# Patient Record
Sex: Female | Born: 1940 | Race: White | Hispanic: No | Marital: Married | Smoking: Never smoker
Health system: Southern US, Community
[De-identification: ages and names within clinical notes are randomized; demographics above are authoritative.]

## PROBLEM LIST (undated history)

## (undated) DIAGNOSIS — M810 Age-related osteoporosis without current pathological fracture: Secondary | ICD-10-CM

## (undated) DIAGNOSIS — M1990 Unspecified osteoarthritis, unspecified site: Secondary | ICD-10-CM

## (undated) DIAGNOSIS — B399 Histoplasmosis, unspecified: Secondary | ICD-10-CM

## (undated) DIAGNOSIS — E7800 Pure hypercholesterolemia, unspecified: Secondary | ICD-10-CM

## (undated) DIAGNOSIS — N200 Calculus of kidney: Secondary | ICD-10-CM

## (undated) DIAGNOSIS — I10 Essential (primary) hypertension: Secondary | ICD-10-CM

## (undated) DIAGNOSIS — M353 Polymyalgia rheumatica: Secondary | ICD-10-CM

## (undated) DIAGNOSIS — G5601 Carpal tunnel syndrome, right upper limb: Secondary | ICD-10-CM

## (undated) HISTORY — DX: Age-related osteoporosis without current pathological fracture: M81.0

## (undated) HISTORY — PX: OTHER SURGICAL HISTORY: SHX169

## (undated) HISTORY — DX: Calculus of kidney: N20.0

## (undated) HISTORY — DX: Carpal tunnel syndrome, right upper limb: G56.01

## (undated) HISTORY — PX: KNEE SURGERY: SHX244

## (undated) HISTORY — PX: JOINT REPLACEMENT: SHX530

## (undated) HISTORY — DX: Pure hypercholesterolemia, unspecified: E78.00

## (undated) HISTORY — PX: TONSILLECTOMY AND ADENOIDECTOMY: SUR1326

## (undated) HISTORY — DX: Polymyalgia rheumatica: M35.3

---

## 1989-12-13 HISTORY — PX: CERVICAL DISCECTOMY: SHX98

## 1991-05-14 DIAGNOSIS — E7800 Pure hypercholesterolemia, unspecified: Secondary | ICD-10-CM

## 1991-05-14 HISTORY — DX: Pure hypercholesterolemia, unspecified: E78.00

## 1995-02-14 DIAGNOSIS — M353 Polymyalgia rheumatica: Secondary | ICD-10-CM

## 1995-02-14 HISTORY — DX: Polymyalgia rheumatica: M35.3

## 1997-07-14 ENCOUNTER — Other Ambulatory Visit: Admission: RE | Admit: 1997-07-14 | Discharge: 1997-07-14 | Payer: Self-pay | Admitting: Obstetrics and Gynecology

## 1998-08-03 ENCOUNTER — Other Ambulatory Visit: Admission: RE | Admit: 1998-08-03 | Discharge: 1998-08-03 | Payer: Self-pay | Admitting: Obstetrics and Gynecology

## 1999-08-20 ENCOUNTER — Other Ambulatory Visit: Admission: RE | Admit: 1999-08-20 | Discharge: 1999-08-20 | Payer: Self-pay | Admitting: Obstetrics and Gynecology

## 2000-09-24 ENCOUNTER — Other Ambulatory Visit: Admission: RE | Admit: 2000-09-24 | Discharge: 2000-09-24 | Payer: Self-pay | Admitting: Obstetrics and Gynecology

## 2001-09-21 ENCOUNTER — Other Ambulatory Visit: Admission: RE | Admit: 2001-09-21 | Discharge: 2001-09-21 | Payer: Self-pay | Admitting: Obstetrics and Gynecology

## 2002-09-26 ENCOUNTER — Other Ambulatory Visit: Admission: RE | Admit: 2002-09-26 | Discharge: 2002-09-26 | Payer: Self-pay | Admitting: Obstetrics and Gynecology

## 2003-01-09 ENCOUNTER — Ambulatory Visit (HOSPITAL_COMMUNITY): Admission: RE | Admit: 2003-01-09 | Discharge: 2003-01-09 | Payer: Self-pay | Admitting: Gastroenterology

## 2003-10-04 ENCOUNTER — Other Ambulatory Visit: Admission: RE | Admit: 2003-10-04 | Discharge: 2003-10-04 | Payer: Self-pay | Admitting: *Deleted

## 2005-11-07 ENCOUNTER — Other Ambulatory Visit: Admission: RE | Admit: 2005-11-07 | Discharge: 2005-11-07 | Payer: Self-pay | Admitting: Obstetrics & Gynecology

## 2006-06-14 HISTORY — PX: CARPAL TUNNEL RELEASE: SHX101

## 2006-06-18 ENCOUNTER — Ambulatory Visit (HOSPITAL_BASED_OUTPATIENT_CLINIC_OR_DEPARTMENT_OTHER): Admission: RE | Admit: 2006-06-18 | Discharge: 2006-06-18 | Payer: Self-pay | Admitting: Orthopedic Surgery

## 2006-10-14 DIAGNOSIS — G5601 Carpal tunnel syndrome, right upper limb: Secondary | ICD-10-CM

## 2006-10-14 HISTORY — DX: Carpal tunnel syndrome, right upper limb: G56.01

## 2007-12-24 ENCOUNTER — Other Ambulatory Visit: Admission: RE | Admit: 2007-12-24 | Discharge: 2007-12-24 | Payer: Self-pay | Admitting: Obstetrics & Gynecology

## 2008-01-14 DIAGNOSIS — N200 Calculus of kidney: Secondary | ICD-10-CM

## 2008-01-14 HISTORY — DX: Calculus of kidney: N20.0

## 2008-04-11 ENCOUNTER — Encounter: Admission: RE | Admit: 2008-04-11 | Discharge: 2008-04-11 | Payer: Self-pay | Admitting: Family Medicine

## 2008-04-11 IMAGING — CT CT PELVIS W/O CM
2 of 4 series · 17 of 46 positions shown, 19 images · non-contrast
Comparison: None

CT ABDOMEN

CLINICAL DATA: Right-sided flank and abdominal pain.

CT ABDOMEN AND PELVIS WITHOUT CONTRAST
TECHNIQUE: Multidetector CT imaging of the abdomen and pelvis was
performed following the standard protocol without intravenous
contrast.

[Series 2: wo · axial · 0.68mm/px · z∈[+1045,+1440]mm · 14 of 87 slices shown, 16 images]
[im 4/87  soft-tissue]
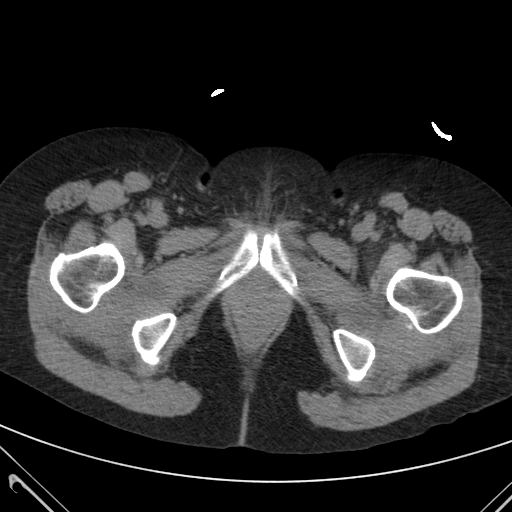
[im 4/87  bone]
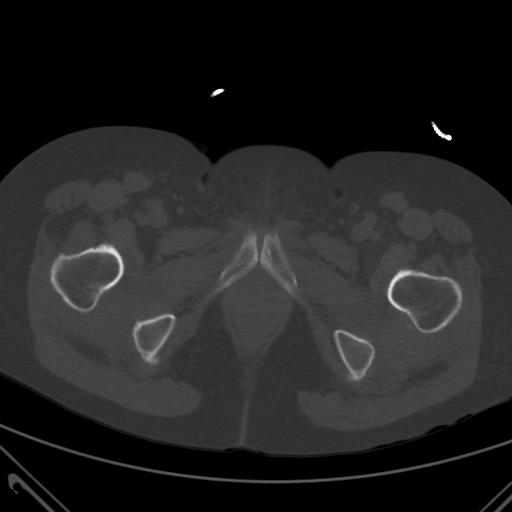
[im 11/87  soft-tissue]
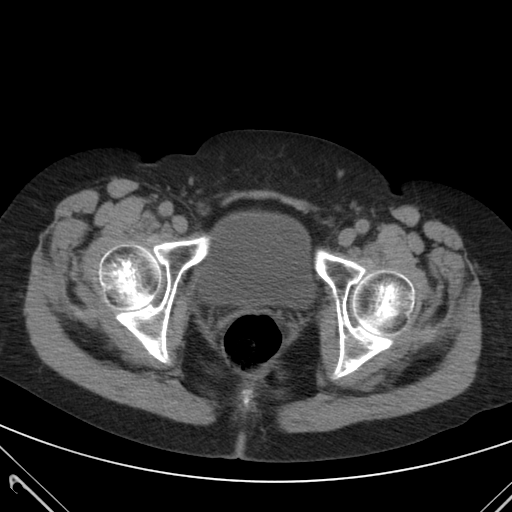
[im 18/87  soft-tissue]
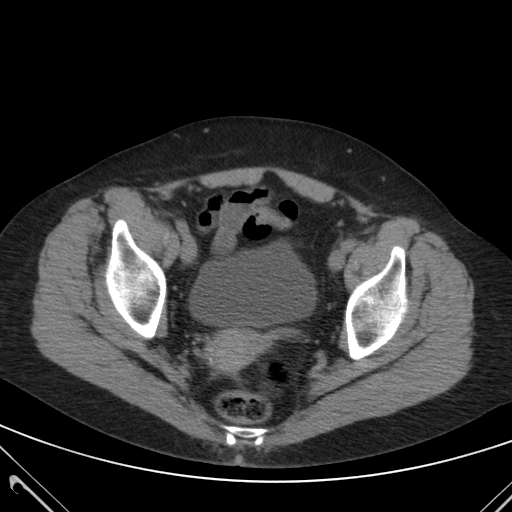
[im 22/87  soft-tissue]
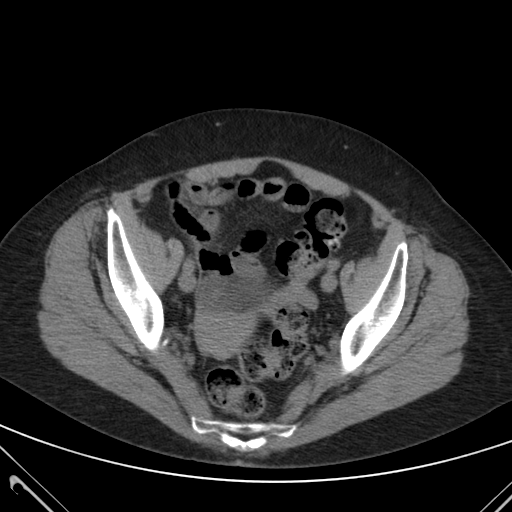
[im 29/87  soft-tissue]
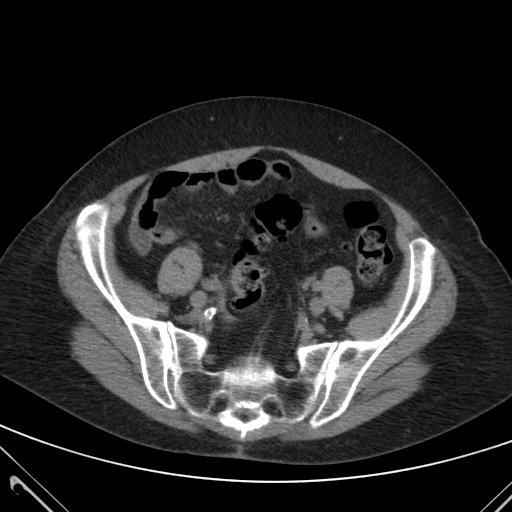
[im 36/87  soft-tissue]
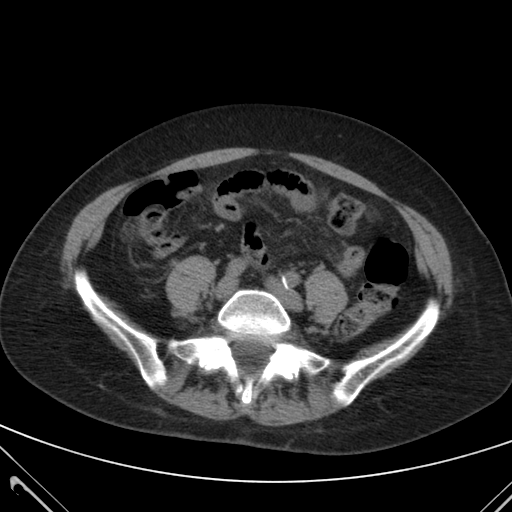
[im 40/87  soft-tissue]
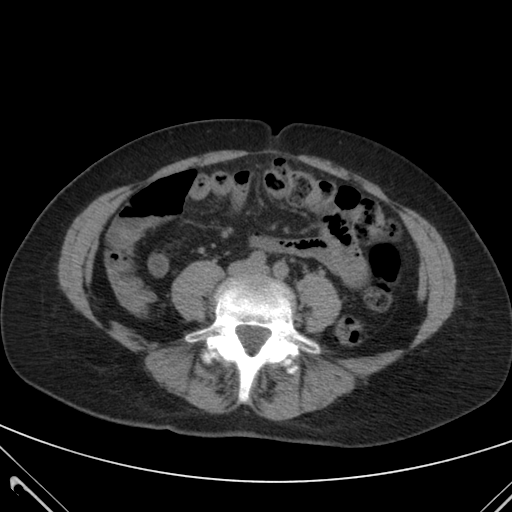
[im 47/87  soft-tissue]
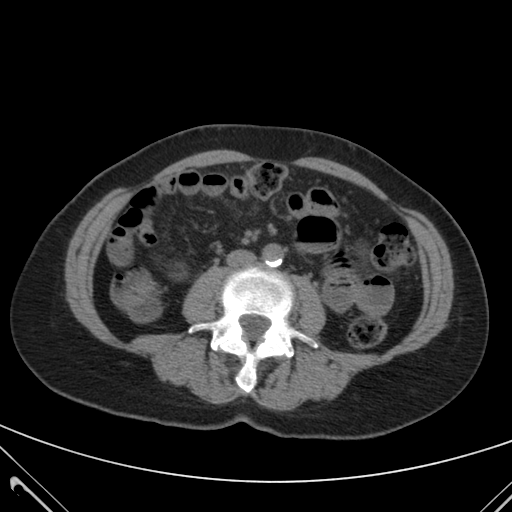
[im 51/87  soft-tissue]
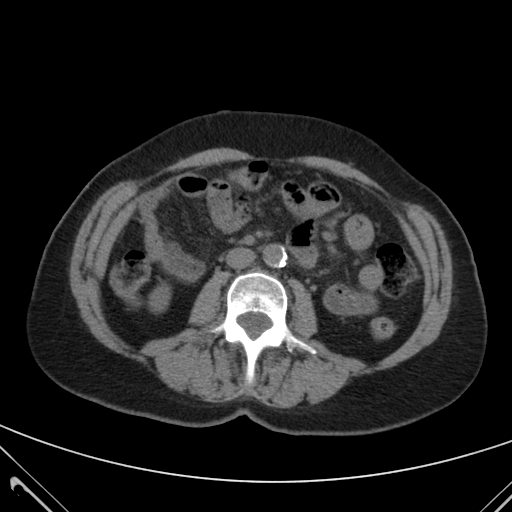
[im 51/87  bone]
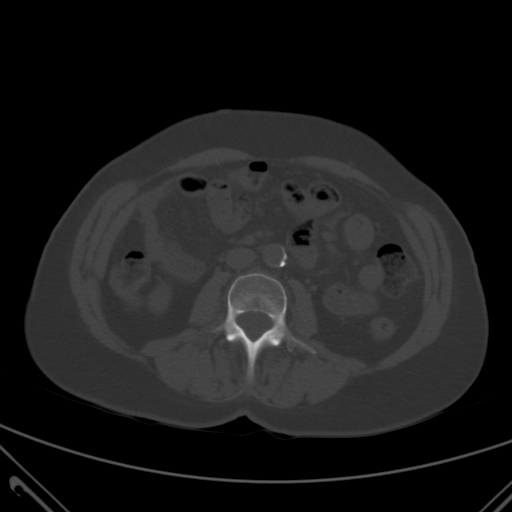
[im 58/87  soft-tissue]
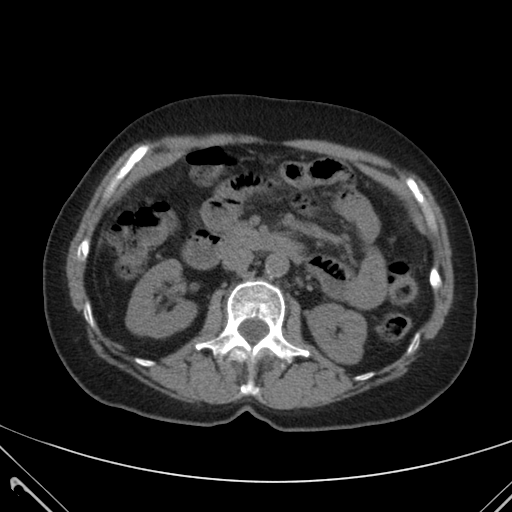
[im 65/87  soft-tissue]
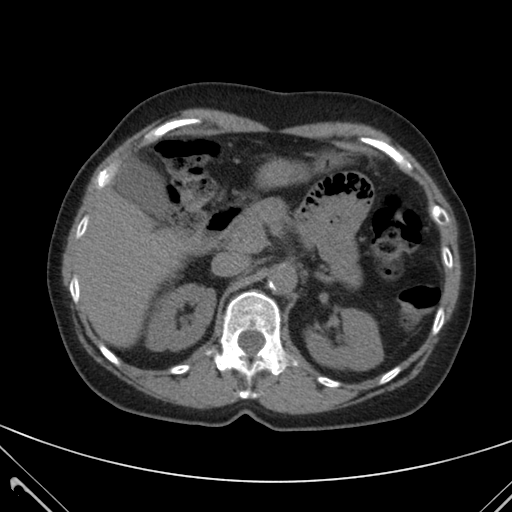
[im 69/87  soft-tissue]
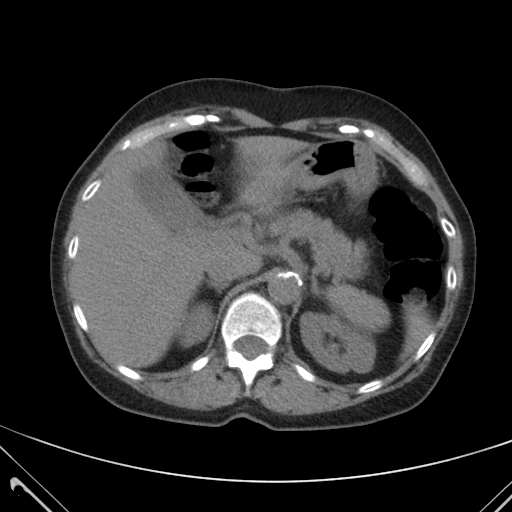
[im 76/87  soft-tissue]
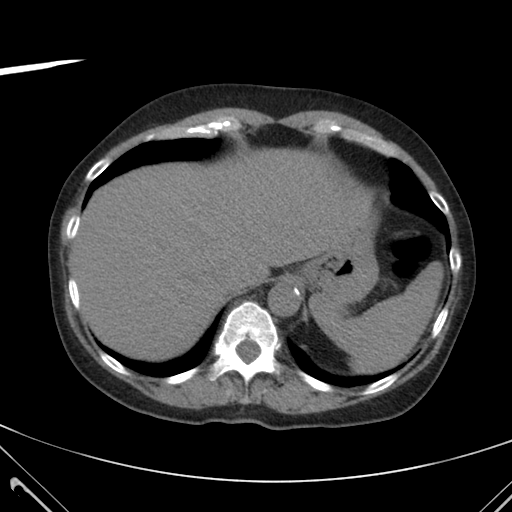
[im 83/87  soft-tissue]
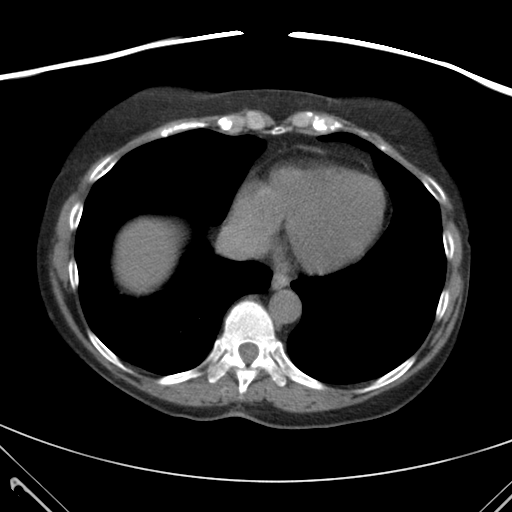

[coronals · coronal · 0.84mm/px · 3 of 115 slices shown]
[im 39/115  soft-tissue]
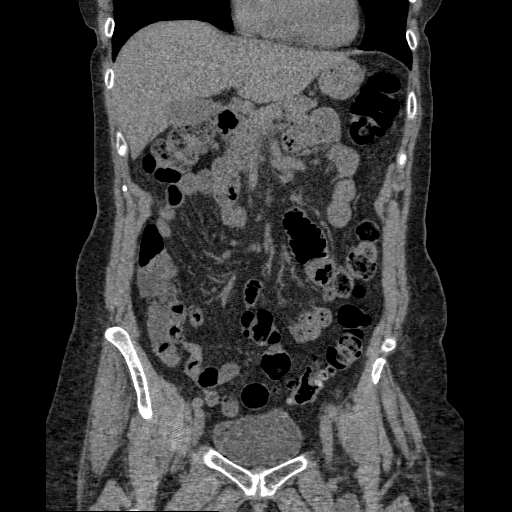
[im 51/115  soft-tissue]
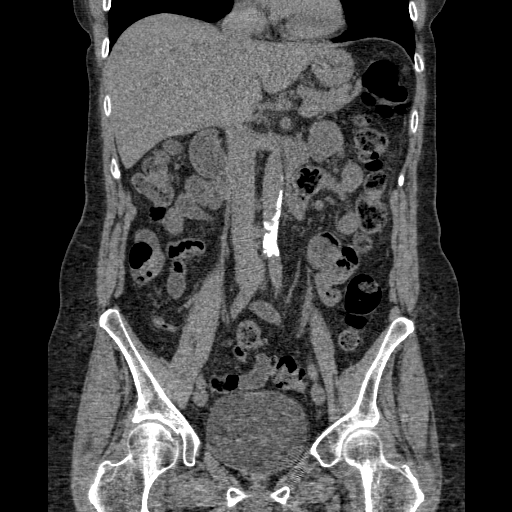
[im 64/115  soft-tissue]
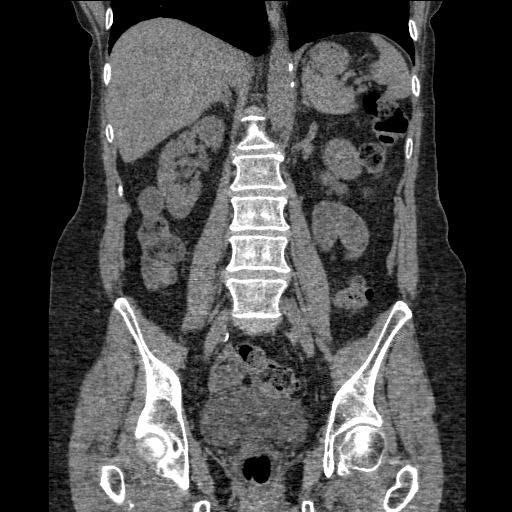

[17 of 46 positions shown; findings below may reference images not displayed]

FINDINGS: A calcified granuloma in the left lower lobe is noted.

The liver, gallbladder, spleen, pancreas and adrenal glands are
unremarkable.
Nonobstructing punctate bilateral renal calculi are identified but
the kidneys are otherwise within normal limits.
Please note that parenchymal abnormalities may be missed as
intravenous contrast was not administered.
No free fluid, enlarged lymph nodes, biliary dilation or abdominal
aortic aneurysm identified.
The visualized bowel is unremarkable.
No acute or suspicious bony abnormalities are identified.
IMPRESSION: No evidence of acute abnormality.

Nonobstructing punctate bilateral renal calculi.

CT PELVIS
FINDINGS: Descending and sigmoid colonic diverticulosis is
identified without evidence of diverticulitis.
The remainder of the bowel and bladder are unremarkable.
The appendix is normal.
There is no evidence of free fluid, enlarged lymph nodes, or
urinary calculi within the pelvis.
No acute or suspicious bony abnormalities are identified.
IMPRESSION: No evidence of acute abnormality.

Colonic diverticulosis without evidence of diverticulitis.

## 2010-05-28 NOTE — Op Note (Signed)
NAME:  Age, Nicha               ACCOUNT NO.:  [id number]   MEDICAL RECORD NO.:  [id number]          PATIENT TYPE:  AMB   LOCATION:  DSC                          FACILITY:  [facility]   PHYSICIAN:  Katy Fitch. Sypher, M.D. DATE OF BIRTH:  14-Jul-1940   DATE OF PROCEDURE:  06/18/2006  DATE OF DISCHARGE:                               OPERATIVE REPORT   PREOPERATIVE DIAGNOSIS:  Bilateral carpal tunnel syndrome.   POSTOPERATIVE DIAGNOSIS:  Bilateral carpal tunnel syndrome.   OPERATION:  Release of right transverse carpal ligament and injection of  left ulnar bursa.   OPERATING SURGEON:  Josephine Igo, M.D.   ASSISTANT:  Molly Maduro Dasnoit PA-C.   ANESTHESIA:  General by LMA, supervising anesthesiologist is Dr. Gypsy Balsam.   INDICATIONS:  Angela [name] is a 33 woman referred through the courtesy  of Dr. Estill Bakes, rheumatologist for evaluation and management of  bilateral hand numbness.  Angela [name] has a history of polymyalgia  rheumatica that Dr. Phylliss Bob has been treating with a tapering steroid dose.   She reported bilateral hand numbness was referred to Dr. Meryl Crutch for  electrodiagnostic studies.  These confirm moderately severe bilateral  carpal tunnel syndrome.   Due to failed respond to nonoperative measures, she is brought to the  operating this time for release of her right transverse carpal ligament  and injection of her left ulnar bursa.   After informed consent she is brought to the operating at this time.   PROCEDURE:  Angela [name] is brought to the operating room and placed  supine position on the operating table.   Following induction general anesthesia by LMA technique the right arm  was prepped Betadine soap solution, sterilely draped.  Following  exsanguination of right arm with Esmarch bandage an arterial tourniquet  on the proximal brachium inflated to 220 meters mercury.  The procedure  commenced with a short incision in line of the ring finger in the palm.  The subcutaneous  tissues were carefully divided revealing the palmar  fascia.  This split longitudinally to reveal the common sensory branch  of the median nerve.  These were followed back to the medium nerve  proper.  The nerve was gently isolated from the transverse carpal  ligament.  Ligament released with scissors along its ulnar border  extending into the distal forearm.  This widely opened carpal canal.  No  mass or other predicaments were noted.   Bleeding points along margin of the released ligament were  electrocauterized with bipolar current followed by repair of the skin  with intradermal 3-0 Prolene and Steri-Strips.   The wound was then dressed with Xeroflo sterile gauze and a volar  plaster splint maintaining the wrist in 5 degrees of dorsiflexion.   Attention directed the left wrist.  After Betadine prep, the ulnar bursa  was injected in line of the ring finger at the proximal wrist flexion  crease with a mixture of 50/50 Depo-Medrol 40 mg per mL and 2% plain  lidocaine for total volume of 2 mL.  Injection was uncomplicated.   Angela [name] was transferred recovery room stable signs.  We will see her  for follow-up in our office in approximately one week.   She will begin splinting of the left wrist to treat her left carpal  tunnel syndrome.      Katy Fitch Sypher, M.D.  Electronically Signed     RVS/MEDQ  D:  06/18/2006  T:  06/18/2006  Job:  [id number]   cc:   Areatha Keas, M.D.

## 2010-05-31 NOTE — Op Note (Signed)
NAME:  Angela [name], Angela [name]                         ACCOUNT NO.:  [id number]   MEDICAL RECORD NO.:  [id number]                   PATIENT TYPE:  AMB   LOCATION:  ENDO                                 FACILITY:  [facility]   PHYSICIAN:  Graylin Shiver, M.D.                DATE OF BIRTH:  1941/01/08   DATE OF PROCEDURE:  01/09/2003  DATE OF DISCHARGE:                                 OPERATIVE REPORT   PROCEDURE:  Colonoscopy.   INDICATIONS:  Screening.   Informed consent was obtained after explanation of the risks of bleeding,  infection, and perforation.   PREMEDICATION:  Fentanyl 50 mcg IV, Versed 7 mg IV.   DESCRIPTION OF PROCEDURE:  With the patient in the left lateral decubitus  position, [name] rectal exam was performed and no masses were felt.  The Olympus  colonoscope was inserted into the rectum and advanced around the colon to  the cecum.  Cecal landmarks were identified.  The cecum and ascending colon  were normal, the transverse colon normal, the descending colon, sigmoid, and  rectum normal.  She tolerated the procedure well without complications.   IMPRESSION:  Normal colonoscopy to the cecum.                                               Graylin Shiver, M.D.    Germain Osgood  D:  01/09/2003  T:  01/09/2003  Job:  [id number]

## 2010-10-31 LAB — BASIC METABOLIC PANEL
BUN: 11
CO2: 27
Calcium: 9.4
Chloride: 105
Creatinine, Ser: 0.8
GFR calc Af Amer: >60
GFR calc non Af Amer: >60
Glucose, Bld: 88
Potassium: 4.4
Sodium: 138

## 2010-10-31 LAB — POCT HEMOGLOBIN-HEMACUE
Hemoglobin: 15.4 — ABNORMAL HIGH
Operator id: 23949

## 2011-02-17 DIAGNOSIS — D3190 Benign neoplasm of unspecified part of unspecified eye: Secondary | ICD-10-CM | POA: Diagnosis not present

## 2011-02-17 DIAGNOSIS — H02829 Cysts of unspecified eye, unspecified eyelid: Secondary | ICD-10-CM | POA: Diagnosis not present

## 2011-02-17 DIAGNOSIS — H02429 Myogenic ptosis of unspecified eyelid: Secondary | ICD-10-CM | POA: Diagnosis not present

## 2011-03-17 DIAGNOSIS — Z01419 Encounter for gynecological examination (general) (routine) without abnormal findings: Secondary | ICD-10-CM | POA: Diagnosis not present

## 2011-03-17 DIAGNOSIS — Z124 Encounter for screening for malignant neoplasm of cervix: Secondary | ICD-10-CM | POA: Diagnosis not present

## 2011-04-28 DIAGNOSIS — H04129 Dry eye syndrome of unspecified lacrimal gland: Secondary | ICD-10-CM | POA: Diagnosis not present

## 2011-04-28 DIAGNOSIS — H01029 Squamous blepharitis unspecified eye, unspecified eyelid: Secondary | ICD-10-CM | POA: Diagnosis not present

## 2011-05-15 DIAGNOSIS — H524 Presbyopia: Secondary | ICD-10-CM | POA: Diagnosis not present

## 2011-05-15 DIAGNOSIS — H5210 Myopia, unspecified eye: Secondary | ICD-10-CM | POA: Diagnosis not present

## 2011-05-15 DIAGNOSIS — H02409 Unspecified ptosis of unspecified eyelid: Secondary | ICD-10-CM | POA: Diagnosis not present

## 2011-05-15 DIAGNOSIS — H52209 Unspecified astigmatism, unspecified eye: Secondary | ICD-10-CM | POA: Diagnosis not present

## 2011-05-19 DIAGNOSIS — M353 Polymyalgia rheumatica: Secondary | ICD-10-CM | POA: Diagnosis not present

## 2011-05-19 DIAGNOSIS — M810 Age-related osteoporosis without current pathological fracture: Secondary | ICD-10-CM | POA: Diagnosis not present

## 2011-06-16 DIAGNOSIS — Z0000 Encounter for general adult medical examination without abnormal findings: Secondary | ICD-10-CM | POA: Diagnosis not present

## 2011-06-16 DIAGNOSIS — Z131 Encounter for screening for diabetes mellitus: Secondary | ICD-10-CM | POA: Diagnosis not present

## 2011-06-16 DIAGNOSIS — M810 Age-related osteoporosis without current pathological fracture: Secondary | ICD-10-CM | POA: Diagnosis not present

## 2011-06-16 DIAGNOSIS — E7800 Pure hypercholesterolemia, unspecified: Secondary | ICD-10-CM | POA: Diagnosis not present

## 2011-06-26 DIAGNOSIS — Z85828 Personal history of other malignant neoplasm of skin: Secondary | ICD-10-CM | POA: Diagnosis not present

## 2011-06-26 DIAGNOSIS — L819 Disorder of pigmentation, unspecified: Secondary | ICD-10-CM | POA: Diagnosis not present

## 2011-06-26 DIAGNOSIS — D239 Other benign neoplasm of skin, unspecified: Secondary | ICD-10-CM | POA: Diagnosis not present

## 2011-06-26 DIAGNOSIS — D1801 Hemangioma of skin and subcutaneous tissue: Secondary | ICD-10-CM | POA: Diagnosis not present

## 2011-06-26 DIAGNOSIS — L821 Other seborrheic keratosis: Secondary | ICD-10-CM | POA: Diagnosis not present

## 2011-06-26 DIAGNOSIS — L570 Actinic keratosis: Secondary | ICD-10-CM | POA: Diagnosis not present

## 2011-08-04 DIAGNOSIS — H11439 Conjunctival hyperemia, unspecified eye: Secondary | ICD-10-CM | POA: Diagnosis not present

## 2011-08-04 DIAGNOSIS — H04129 Dry eye syndrome of unspecified lacrimal gland: Secondary | ICD-10-CM | POA: Diagnosis not present

## 2011-08-04 DIAGNOSIS — H02429 Myogenic ptosis of unspecified eyelid: Secondary | ICD-10-CM | POA: Diagnosis not present

## 2011-08-04 DIAGNOSIS — H01029 Squamous blepharitis unspecified eye, unspecified eyelid: Secondary | ICD-10-CM | POA: Diagnosis not present

## 2011-08-14 DIAGNOSIS — L570 Actinic keratosis: Secondary | ICD-10-CM | POA: Diagnosis not present

## 2011-10-13 DIAGNOSIS — Z23 Encounter for immunization: Secondary | ICD-10-CM | POA: Diagnosis not present

## 2011-10-20 DIAGNOSIS — M353 Polymyalgia rheumatica: Secondary | ICD-10-CM | POA: Diagnosis not present

## 2011-10-20 DIAGNOSIS — M810 Age-related osteoporosis without current pathological fracture: Secondary | ICD-10-CM | POA: Diagnosis not present

## 2011-12-04 DIAGNOSIS — H02409 Unspecified ptosis of unspecified eyelid: Secondary | ICD-10-CM | POA: Diagnosis not present

## 2011-12-04 DIAGNOSIS — H04129 Dry eye syndrome of unspecified lacrimal gland: Secondary | ICD-10-CM | POA: Diagnosis not present

## 2011-12-04 DIAGNOSIS — D3130 Benign neoplasm of unspecified choroid: Secondary | ICD-10-CM | POA: Diagnosis not present

## 2011-12-04 DIAGNOSIS — H259 Unspecified age-related cataract: Secondary | ICD-10-CM | POA: Diagnosis not present

## 2011-12-10 DIAGNOSIS — Z1231 Encounter for screening mammogram for malignant neoplasm of breast: Secondary | ICD-10-CM | POA: Diagnosis not present

## 2012-02-14 HISTORY — PX: BLEPHAROPLASTY: SUR158

## 2012-03-02 DIAGNOSIS — R197 Diarrhea, unspecified: Secondary | ICD-10-CM | POA: Diagnosis not present

## 2012-04-08 ENCOUNTER — Ambulatory Visit (INDEPENDENT_AMBULATORY_CARE_PROVIDER_SITE_OTHER): Payer: Medicare Other | Admitting: Obstetrics & Gynecology

## 2012-04-08 ENCOUNTER — Encounter: Payer: Self-pay | Admitting: Obstetrics & Gynecology

## 2012-04-08 VITALS — BP 104/58 | Ht 66.5 in | Wt 143.8 lb

## 2012-04-08 DIAGNOSIS — N952 Postmenopausal atrophic vaginitis: Secondary | ICD-10-CM

## 2012-04-08 DIAGNOSIS — Z01419 Encounter for gynecological examination (general) (routine) without abnormal findings: Secondary | ICD-10-CM

## 2012-04-08 MED ORDER — ESTRADIOL 10 MCG VA TABS: 1.0000 | ORAL_TABLET | VAGINAL | Status: DC

## 2012-04-08 NOTE — Progress Notes (Signed)
72 y.o. G3P3 MarriedCaucasianF here for annual exam.  No vaginal bleeding.  Doing well.  Had eye brow lift last year due to trouble seeing due to the droopy eye lids.    No LMP recorded. Patient is postmenopausal.          Sexually active: no  The current method of family planning is vasectomy and post menopausal status.    Exercising: yes  walking Smoker:  no  Health Maintenance: Pap:  02/13/10 WNL  MMG:  12/10/11 3D normal Colonoscopy:  2004 due this year BMD:   11/18/10 (-2.5 worse T score--hip) TDaP:  2/08.  Has done pneumovax and zostavax vaccines as well. Labs:  Dr Azzie Glatter in June 2014   reports that she has never smoked. She does not have any smokeless tobacco history on file. She reports that  drinks alcohol. She reports that she does not use illicit drugs.  Past Medical History  Diagnosis Date  . Nephrolithiasis 2010  . Polymyalgia 2/97  . Osteoporosis   . Carpal tunnel syndrome on right 10/08  . Elevated cholesterol 5/93    Past Surgical History  Procedure Laterality Date  . Tonsillectomy and adenoidectomy    . Knee surgery Left   . Cervical discectomy  12/91  . Carpal tunnel release Right 6/08    Current Outpatient Prescriptions  Medication Sig Dispense Refill  . Cholecalciferol (VITAMIN D PO) Take 1,000 Int'l Units by mouth.      . Estradiol (VAGIFEM) 10 MCG TABS Place vaginally 2 (two) times a week.      . Multiple Vitamins-Minerals (MULTIVITAMIN PO) Take by mouth daily.      . Omega-3 Fatty Acids (FISH OIL PO) Take by mouth.       No current facility-administered medications for this visit.    Family History  Problem Relation Age of Onset  . Hypertension Mother   . Stroke Father   . Thyroid disease Mother     ROS:  Pertinent items are noted in HPI.  Otherwise, a comprehensive ROS was negative.  Exam:   BP 104/58  Ht 5' 6.5" (1.689 m)  Wt 143 lb 12.8 oz (65.227 kg)  BMI 22.86 kg/m2    Height: 5' 6.5" (168.9 cm)  Wt change: down 6 pounds  (pt working on this) Ht Readings from Last 3 Encounters:  04/08/12 5' 6.5" (1.689 m)    General appearance: alert, cooperative and appears stated age Head: Normocephalic, without obvious abnormality, atraumatic Neck: no adenopathy, supple, symmetrical, trachea midline and thyroid not enlarged, symmetric, no tenderness/mass/nodules Lungs: clear to auscultation bilaterally Breasts: Inspection negative, No nipple retraction or dimpling, No nipple discharge or bleeding, No axillary or supraclavicular adenopathy, Normal to palpation without dominant masses Heart: regular rate and rhythm Abdomen: soft, non-tender; bowel sounds normal; no masses,  no organomegaly Extremities: extremities normal, atraumatic, no cyanosis or edema Skin: Skin color, texture, turgor normal. No rashes or lesions Lymph nodes: Cervical, supraclavicular, and axillary nodes normal. No abnormal inguinal nodes palpated Neurologic: Grossly normal   Pelvic: External genitalia:  no lesions              Urethra:  normal appearing urethra with no masses, tenderness or lesions              Bartholins and Skenes: normal                 Vagina: atrohpic, narrowing at apex  Cervix: no lesions              Pap taken: yes Bimanual Exam:  Uterus:  normal size, contour, position, consistency, mobility, non-tender              Adnexa: no mass, fullness, tenderness               Rectovaginal: Confirms               Anus:  normal sphincter tone, no lesions  A:  Well Woman with normal exam Vaginal atrophy Osteoporsis  P:   mammogram pap smear today Continue vagifem.  Rx to pharmacy Pt knows colonoscopy is due this year return annually or prn  An After Visit Summary was printed and given to the patient.

## 2012-04-08 NOTE — Patient Instructions (Signed)

## 2012-04-09 LAB — IPS PAP SMEAR ONLY

## 2012-04-19 DIAGNOSIS — M810 Age-related osteoporosis without current pathological fracture: Secondary | ICD-10-CM | POA: Diagnosis not present

## 2012-04-19 DIAGNOSIS — M353 Polymyalgia rheumatica: Secondary | ICD-10-CM | POA: Diagnosis not present

## 2012-06-17 DIAGNOSIS — Z131 Encounter for screening for diabetes mellitus: Secondary | ICD-10-CM | POA: Diagnosis not present

## 2012-06-17 DIAGNOSIS — Z0000 Encounter for general adult medical examination without abnormal findings: Secondary | ICD-10-CM | POA: Diagnosis not present

## 2012-06-17 DIAGNOSIS — M810 Age-related osteoporosis without current pathological fracture: Secondary | ICD-10-CM | POA: Diagnosis not present

## 2012-07-15 DIAGNOSIS — D1801 Hemangioma of skin and subcutaneous tissue: Secondary | ICD-10-CM | POA: Diagnosis not present

## 2012-07-15 DIAGNOSIS — L819 Disorder of pigmentation, unspecified: Secondary | ICD-10-CM | POA: Diagnosis not present

## 2012-07-15 DIAGNOSIS — Z85828 Personal history of other malignant neoplasm of skin: Secondary | ICD-10-CM | POA: Diagnosis not present

## 2012-07-15 DIAGNOSIS — L821 Other seborrheic keratosis: Secondary | ICD-10-CM | POA: Diagnosis not present

## 2012-07-15 DIAGNOSIS — D239 Other benign neoplasm of skin, unspecified: Secondary | ICD-10-CM | POA: Diagnosis not present

## 2012-07-15 DIAGNOSIS — L680 Hirsutism: Secondary | ICD-10-CM | POA: Diagnosis not present

## 2012-08-18 ENCOUNTER — Other Ambulatory Visit: Payer: Self-pay

## 2012-10-08 DIAGNOSIS — Z23 Encounter for immunization: Secondary | ICD-10-CM | POA: Diagnosis not present

## 2012-11-18 ENCOUNTER — Other Ambulatory Visit: Payer: Self-pay

## 2012-11-25 DIAGNOSIS — M810 Age-related osteoporosis without current pathological fracture: Secondary | ICD-10-CM | POA: Diagnosis not present

## 2012-11-25 DIAGNOSIS — M899 Disorder of bone, unspecified: Secondary | ICD-10-CM | POA: Diagnosis not present

## 2012-11-25 DIAGNOSIS — M353 Polymyalgia rheumatica: Secondary | ICD-10-CM | POA: Diagnosis not present

## 2012-12-02 DIAGNOSIS — D485 Neoplasm of uncertain behavior of skin: Secondary | ICD-10-CM | POA: Diagnosis not present

## 2012-12-02 DIAGNOSIS — L570 Actinic keratosis: Secondary | ICD-10-CM | POA: Diagnosis not present

## 2012-12-06 DIAGNOSIS — H16109 Unspecified superficial keratitis, unspecified eye: Secondary | ICD-10-CM | POA: Diagnosis not present

## 2012-12-06 DIAGNOSIS — D3130 Benign neoplasm of unspecified choroid: Secondary | ICD-10-CM | POA: Diagnosis not present

## 2012-12-06 DIAGNOSIS — H43819 Vitreous degeneration, unspecified eye: Secondary | ICD-10-CM | POA: Diagnosis not present

## 2012-12-06 DIAGNOSIS — H04129 Dry eye syndrome of unspecified lacrimal gland: Secondary | ICD-10-CM | POA: Diagnosis not present

## 2012-12-13 DIAGNOSIS — Z1231 Encounter for screening mammogram for malignant neoplasm of breast: Secondary | ICD-10-CM | POA: Diagnosis not present

## 2012-12-28 DIAGNOSIS — H6120 Impacted cerumen, unspecified ear: Secondary | ICD-10-CM | POA: Diagnosis not present

## 2013-01-19 DIAGNOSIS — R509 Fever, unspecified: Secondary | ICD-10-CM | POA: Diagnosis not present

## 2013-06-03 DIAGNOSIS — L570 Actinic keratosis: Secondary | ICD-10-CM | POA: Diagnosis not present

## 2013-06-03 DIAGNOSIS — D485 Neoplasm of uncertain behavior of skin: Secondary | ICD-10-CM | POA: Diagnosis not present

## 2013-06-07 DIAGNOSIS — L820 Inflamed seborrheic keratosis: Secondary | ICD-10-CM | POA: Diagnosis not present

## 2013-06-20 DIAGNOSIS — H9190 Unspecified hearing loss, unspecified ear: Secondary | ICD-10-CM | POA: Diagnosis not present

## 2013-06-20 DIAGNOSIS — Z0000 Encounter for general adult medical examination without abnormal findings: Secondary | ICD-10-CM | POA: Diagnosis not present

## 2013-06-20 DIAGNOSIS — Z136 Encounter for screening for cardiovascular disorders: Secondary | ICD-10-CM | POA: Diagnosis not present

## 2013-06-20 DIAGNOSIS — Z23 Encounter for immunization: Secondary | ICD-10-CM | POA: Diagnosis not present

## 2013-06-20 DIAGNOSIS — Z131 Encounter for screening for diabetes mellitus: Secondary | ICD-10-CM | POA: Diagnosis not present

## 2013-06-20 DIAGNOSIS — M810 Age-related osteoporosis without current pathological fracture: Secondary | ICD-10-CM | POA: Diagnosis not present

## 2013-07-08 ENCOUNTER — Encounter: Payer: Self-pay | Admitting: Obstetrics & Gynecology

## 2013-07-08 ENCOUNTER — Ambulatory Visit (INDEPENDENT_AMBULATORY_CARE_PROVIDER_SITE_OTHER): Payer: Medicare Other | Admitting: Obstetrics & Gynecology

## 2013-07-08 VITALS — BP 102/60 | HR 58 | Resp 16 | Ht 66.25 in | Wt 147.0 lb

## 2013-07-08 DIAGNOSIS — Z01419 Encounter for gynecological examination (general) (routine) without abnormal findings: Secondary | ICD-10-CM | POA: Diagnosis not present

## 2013-07-08 DIAGNOSIS — M353 Polymyalgia rheumatica: Secondary | ICD-10-CM

## 2013-07-08 DIAGNOSIS — N952 Postmenopausal atrophic vaginitis: Secondary | ICD-10-CM | POA: Diagnosis not present

## 2013-07-08 DIAGNOSIS — Z124 Encounter for screening for malignant neoplasm of cervix: Secondary | ICD-10-CM

## 2013-07-08 MED ORDER — ESTRADIOL 10 MCG VA TABS: 1.0000 | ORAL_TABLET | VAGINAL | Status: DC

## 2013-07-08 NOTE — Progress Notes (Addendum)
73 y.o. G3P3 MarriedCaucasianF here for annual exam.  Doing really well.  No vaginal bleeding.  Just got back from [place], [place], for a week's vacation.  Really enjoyed the week with family.    Just saw Dr. Kathyrn Lass.  Her office is scheduling the colonoscopy.  Labs were all fine per pt.  Stopped her vagifem.  Using Olive oil.    Patient's last menstrual period was 01/13/1993.          Sexually active: No.  The current method of family planning is none.    Exercising: Yes.    walking Smoker:  no  Health Maintenance: Pap:  04/08/12 WNL History of abnormal Pap:  no MMG:  12/13/12 3D-normal Colonoscopy:  2004-due, is waiting for appointment call BMD:   2014 with [facility] 2 years.  Dr. Melissa Noon office.  (Copy came 07/27/13- spine -1.4/ hip-2.7) TDaP:  2/08 Screening Labs: PCP, Hb today: PCP, Urine today: PCP   reports that she has never smoked. She has never used smokeless tobacco. She reports that she drinks about .5 ounces of alcohol per week. She reports that she does not use illicit drugs.  Past Medical History  Diagnosis Date  . Nephrolithiasis 2010  . Polymyalgia 2/97  . Osteoporosis   . Carpal tunnel syndrome on right 10/08  . Elevated cholesterol 5/93    Past Surgical History  Procedure Laterality Date  . Tonsillectomy and adenoidectomy    . Knee surgery Left   . Cervical discectomy  12/91  . Carpal tunnel release Right 6/08  . Blepharoplasty Bilateral 2/14    Dr. Isidoro Donning  . Spots removed      frozen and biopsied-all benign    Current Outpatient Prescriptions  Medication Sig Dispense Refill  . Cholecalciferol (VITAMIN D PO) Take 1,000 Int'l Units by mouth.      . Multiple Vitamins-Minerals (MULTIVITAMIN PO) Take by mouth daily.      . Omega-3 Fatty Acids (FISH OIL PO) Take by mouth.      . Estradiol (VAGIFEM) 10 MCG TABS Place 1 tablet (10 mcg total) vaginally 2 (two) times a week.  24 tablet  4   No current facility-administered medications  for this visit.    Family History  Problem Relation Age of Onset  . Hypertension Mother   . Stroke Father   . Thyroid disease Mother     ROS:  Pertinent items are noted in HPI.  Otherwise, a comprehensive ROS was negative.  Exam:   BP 102/60  Pulse 58  Resp 16  Ht 5' 6.25" (1.683 m)  Wt 147 lb (66.679 kg)  BMI 23.54 kg/m2  LMP 01/13/1993  Weight change:  +3lbs  Height: 5' 6.25" (168.3 cm)  Ht Readings from Last 3 Encounters:  07/08/13 5' 6.25" (1.683 m)  04/08/12 5' 6.5" (1.689 m)    General appearance: alert, cooperative and appears stated age Head: Normocephalic, without obvious abnormality, atraumatic Neck: no adenopathy, supple, symmetrical, trachea midline and thyroid normal to inspection and palpation Lungs: clear to auscultation bilaterally Breasts: normal appearance, no masses or tenderness Heart: regular rate and rhythm Abdomen: soft, non-tender; bowel sounds normal; no masses,  no organomegaly Extremities: extremities normal, atraumatic, no cyanosis or edema Skin: Skin color, texture, turgor normal. No rashes or lesions Lymph nodes: Cervical, supraclavicular, and axillary nodes normal. No abnormal inguinal nodes palpated Neurologic: Grossly normal   Pelvic: External genitalia:  no lesions  Urethra:  normal appearing urethra with no masses, tenderness or lesions              Bartholins and Skenes: normal                 Vagina: normal appearing vagina with normal color and discharge, no lesions              Cervix: no lesions              Pap taken: No. Bimanual Exam:  Uterus:  normal size, contour, position, consistency, mobility, non-tender              Adnexa: normal adnexa and no mass, fullness, tenderness               Rectovaginal: Confirms               Anus:  normal sphincter tone, no lesions  A:  Well Woman with normal exam  Vaginal atrophy  Osteoporsis  Polymalgia H/O nephrolithiasis  P: mammogram yearly pap smear 2014.  No pap  today. Stopped vagifem this year.  No rx needed. Pt knows colonoscopy is due.  Waiting on referral from Dr. Irma Newness office. return annually or prn  An After Visit Summary was printed and given to the patient.

## 2013-07-08 NOTE — Patient Instructions (Signed)

## 2013-07-27 DIAGNOSIS — L570 Actinic keratosis: Secondary | ICD-10-CM | POA: Diagnosis not present

## 2013-07-27 DIAGNOSIS — D1801 Hemangioma of skin and subcutaneous tissue: Secondary | ICD-10-CM | POA: Diagnosis not present

## 2013-07-27 DIAGNOSIS — Z85828 Personal history of other malignant neoplasm of skin: Secondary | ICD-10-CM | POA: Diagnosis not present

## 2013-07-27 DIAGNOSIS — D239 Other benign neoplasm of skin, unspecified: Secondary | ICD-10-CM | POA: Diagnosis not present

## 2013-07-27 DIAGNOSIS — L821 Other seborrheic keratosis: Secondary | ICD-10-CM | POA: Diagnosis not present

## 2013-07-27 DIAGNOSIS — L819 Disorder of pigmentation, unspecified: Secondary | ICD-10-CM | POA: Diagnosis not present

## 2013-10-08 DIAGNOSIS — Z23 Encounter for immunization: Secondary | ICD-10-CM | POA: Diagnosis not present

## 2013-10-11 DIAGNOSIS — L570 Actinic keratosis: Secondary | ICD-10-CM | POA: Diagnosis not present

## 2013-11-14 ENCOUNTER — Encounter: Payer: Self-pay | Admitting: Obstetrics & Gynecology

## 2013-11-24 DIAGNOSIS — M810 Age-related osteoporosis without current pathological fracture: Secondary | ICD-10-CM | POA: Diagnosis not present

## 2013-11-24 DIAGNOSIS — M353 Polymyalgia rheumatica: Secondary | ICD-10-CM | POA: Diagnosis not present

## 2013-12-15 DIAGNOSIS — H04123 Dry eye syndrome of bilateral lacrimal glands: Secondary | ICD-10-CM | POA: Diagnosis not present

## 2013-12-15 DIAGNOSIS — D3132 Benign neoplasm of left choroid: Secondary | ICD-10-CM | POA: Diagnosis not present

## 2013-12-15 DIAGNOSIS — H2513 Age-related nuclear cataract, bilateral: Secondary | ICD-10-CM | POA: Diagnosis not present

## 2013-12-15 DIAGNOSIS — H43813 Vitreous degeneration, bilateral: Secondary | ICD-10-CM | POA: Diagnosis not present

## 2014-01-02 DIAGNOSIS — R0982 Postnasal drip: Secondary | ICD-10-CM | POA: Diagnosis not present

## 2014-01-02 DIAGNOSIS — H6980 Other specified disorders of Eustachian tube, unspecified ear: Secondary | ICD-10-CM | POA: Diagnosis not present

## 2014-01-02 DIAGNOSIS — J329 Chronic sinusitis, unspecified: Secondary | ICD-10-CM | POA: Diagnosis not present

## 2014-04-01 DIAGNOSIS — J019 Acute sinusitis, unspecified: Secondary | ICD-10-CM | POA: Diagnosis not present

## 2014-06-13 DIAGNOSIS — Z1231 Encounter for screening mammogram for malignant neoplasm of breast: Secondary | ICD-10-CM | POA: Diagnosis not present

## 2014-06-23 DIAGNOSIS — M810 Age-related osteoporosis without current pathological fracture: Secondary | ICD-10-CM | POA: Diagnosis not present

## 2014-06-23 DIAGNOSIS — Z1211 Encounter for screening for malignant neoplasm of colon: Secondary | ICD-10-CM | POA: Diagnosis not present

## 2014-06-23 DIAGNOSIS — Z0000 Encounter for general adult medical examination without abnormal findings: Secondary | ICD-10-CM | POA: Diagnosis not present

## 2014-06-23 DIAGNOSIS — H9193 Unspecified hearing loss, bilateral: Secondary | ICD-10-CM | POA: Diagnosis not present

## 2014-07-10 ENCOUNTER — Other Ambulatory Visit: Payer: Self-pay

## 2014-07-25 DIAGNOSIS — Z1211 Encounter for screening for malignant neoplasm of colon: Secondary | ICD-10-CM | POA: Diagnosis not present

## 2014-07-25 DIAGNOSIS — K5730 Diverticulosis of large intestine without perforation or abscess without bleeding: Secondary | ICD-10-CM | POA: Diagnosis not present

## 2014-08-01 DIAGNOSIS — D1800 Hemangioma unspecified site: Secondary | ICD-10-CM | POA: Diagnosis not present

## 2014-08-01 DIAGNOSIS — D225 Melanocytic nevi of trunk: Secondary | ICD-10-CM | POA: Diagnosis not present

## 2014-08-01 DIAGNOSIS — Z85828 Personal history of other malignant neoplasm of skin: Secondary | ICD-10-CM | POA: Diagnosis not present

## 2014-08-01 DIAGNOSIS — L814 Other melanin hyperpigmentation: Secondary | ICD-10-CM | POA: Diagnosis not present

## 2014-08-01 DIAGNOSIS — L821 Other seborrheic keratosis: Secondary | ICD-10-CM | POA: Diagnosis not present

## 2014-08-04 ENCOUNTER — Encounter: Payer: Self-pay | Admitting: Obstetrics & Gynecology

## 2014-08-04 ENCOUNTER — Ambulatory Visit (INDEPENDENT_AMBULATORY_CARE_PROVIDER_SITE_OTHER): Payer: Medicare Other | Admitting: Obstetrics & Gynecology

## 2014-08-04 VITALS — BP 116/84 | HR 70 | Ht 65.75 in | Wt 148.6 lb

## 2014-08-04 DIAGNOSIS — Z124 Encounter for screening for malignant neoplasm of cervix: Secondary | ICD-10-CM

## 2014-08-04 DIAGNOSIS — M353 Polymyalgia rheumatica: Secondary | ICD-10-CM | POA: Diagnosis not present

## 2014-08-04 DIAGNOSIS — Z01419 Encounter for gynecological examination (general) (routine) without abnormal findings: Secondary | ICD-10-CM

## 2014-08-04 DIAGNOSIS — Z0000 Encounter for general adult medical examination without abnormal findings: Secondary | ICD-10-CM

## 2014-08-04 DIAGNOSIS — E782 Mixed hyperlipidemia: Secondary | ICD-10-CM | POA: Diagnosis not present

## 2014-08-04 LAB — POCT URINALYSIS DIPSTICK
Leukocytes, UA: NEGATIVE
Urobilinogen, UA: NEGATIVE
pH, UA: 5

## 2014-08-04 NOTE — Progress Notes (Signed)
74 y.o. G3P3 MarriedCaucasianF here for annual exam.  No vaginal bleeding.    Rheumatologist:  Dr. Amil Amen is going out on his own.  Currently, at [facility].   PCP:  Dr. Sabra Heck.  Last appt was in June.  No blood work was done.    Dermatologist:  Dr. Renda Rolls.  Is seen yearly.    Patient's last menstrual period was 01/13/1993.          Sexually active: No.  The current method of family planning is post menopausal status and husband has vasectomy. Exercising: Yes.    walking depends  Smoker:  no  Health Maintenance: Pap:  04/08/2012 wnl History of abnormal Pap:  no MMG:  06/13/14 bi-rads 1: negative Colonoscopy:  07/25/14 Normal, Dr Penelope Coop BMD:   11/25/2012 is scheduled for Bone Density in November 2016.   TDaP: up to date  Screening Labs: yes , Hb today: 13.9 , Urine today: Neg   reports that she has never smoked. She has never used smokeless tobacco. She reports that she drinks about 0.6 oz of alcohol per week. She reports that she does not use illicit drugs.  Past Medical History  Diagnosis Date  . Nephrolithiasis 2010  . Polymyalgia 2/97  . Osteoporosis   . Carpal tunnel syndrome on right 10/08  . Elevated cholesterol 5/93    Past Surgical History  Procedure Laterality Date  . Tonsillectomy and adenoidectomy    . Knee surgery Left   . Cervical discectomy  12/91  . Carpal tunnel release Right 6/08  . Blepharoplasty Bilateral 2/14    Dr. Isidoro Donning  . Spots removed      frozen and biopsied-all benign    Current Outpatient Prescriptions  Medication Sig Dispense Refill  . Cholecalciferol (VITAMIN D PO) Take 1,000 Int'l Units by mouth.    . Multiple Vitamins-Minerals (MULTIVITAMIN PO) Take by mouth daily.    . Omega-3 Fatty Acids (FISH OIL PO) Take by mouth.     No current facility-administered medications for this visit.    Family History  Problem Relation Age of Onset  . Hypertension Mother   . Stroke Father   . Thyroid disease Mother     ROS:   Pertinent items are noted in HPI.  Otherwise, a comprehensive ROS was negative.  Exam:   BP 116/84 mmHg  Pulse 70  Ht 5' 5.75" (1.67 m)  Wt 148 lb 9.6 oz (67.405 kg)  BMI 24.17 kg/m2  LMP 01/13/1993  Weight change: +1# Height:   Height: 5' 5.75" (167 cm)  Ht Readings from Last 3 Encounters:  08/04/14 5' 5.75" (1.67 m)  07/08/13 5' 6.25" (1.683 m)  04/08/12 5' 6.5" (1.689 m)    General appearance: alert, cooperative and appears stated age Head: Normocephalic, without obvious abnormality, atraumatic Neck: no adenopathy, supple, symmetrical, trachea midline and thyroid normal to inspection and palpation Lungs: clear to auscultation bilaterally Breasts: normal appearance, no masses or tenderness Heart: regular rate and rhythm Abdomen: soft, non-tender; bowel sounds normal; no masses,  no organomegaly Extremities: extremities normal, atraumatic, no cyanosis or edema Skin: Skin color, texture, turgor normal. No rashes or lesions Lymph nodes: Cervical, supraclavicular, and axillary nodes normal. No abnormal inguinal nodes palpated Neurologic: Grossly normal   Pelvic: External genitalia:  no lesions              Urethra:  normal appearing urethra with no masses, tenderness or lesions              Bartholins  and Skenes: normal                 Vagina: normal appearing vagina with normal color and discharge, no lesions              Cervix: no lesions              Pap taken: Yes.   Bimanual Exam:  Uterus:  normal size, contour, position, consistency, mobility, non-tender              Adnexa: normal adnexa and no mass, fullness, tenderness               Rectovaginal: Confirms               Anus:  normal sphincter tone, no lesions  Chaperone was present for exam.  A:  Well Woman with normal exam  Vaginal atrophy  Osteoporsis  Polymyalgia rheumatica H/O nephrolithiasis  P: mammogram yearly pap smear 2014. Pap today. CMP, cholesterol obtained today.   return annually or  prn

## 2014-08-05 LAB — COMPREHENSIVE METABOLIC PANEL
ALT: 11 U/L (ref 0–35)
AST: 19 U/L (ref 0–37)
Albumin: 3.9 g/dL (ref 3.5–5.2)
Alkaline Phosphatase: 70 U/L (ref 39–117)
BUN: 13 mg/dL (ref 6–23)
CO2: 26 mEq/L (ref 19–32)
Calcium: 9.8 mg/dL (ref 8.4–10.5)
Chloride: 100 mEq/L (ref 96–112)
Creat: 0.74 mg/dL (ref 0.50–1.10)
Glucose, Bld: 78 mg/dL (ref 70–99)
Potassium: 4.7 mEq/L (ref 3.5–5.3)
Sodium: 138 mEq/L (ref 135–145)
Total Bilirubin: 0.4 mg/dL (ref 0.2–1.2)
Total Protein: 6.3 g/dL (ref 6.0–8.3)

## 2014-08-05 LAB — LIPID PANEL
Cholesterol: 246 mg/dL — ABNORMAL HIGH (ref 0–200)
HDL: 68 mg/dL (ref >=46)
LDL Cholesterol: 155 mg/dL — ABNORMAL HIGH (ref 0–99)
Total CHOL/HDL Ratio: 3.6 Ratio
Triglycerides: 114 mg/dL (ref <150)
VLDL: 23 mg/dL (ref 0–40)

## 2014-08-07 LAB — HEMOGLOBIN, FINGERSTICK: Hemoglobin, fingerstick: 13.9 g/dL (ref 12.0–16.0)

## 2014-08-08 LAB — IPS PAP SMEAR ONLY

## 2014-08-10 ENCOUNTER — Telehealth: Payer: Self-pay

## 2014-08-10 NOTE — Telephone Encounter (Signed)
Lmtcb//kn 

## 2014-08-10 NOTE — Telephone Encounter (Signed)
-----   Message from Megan Salon, MD sent at 08/07/2014 11:46 AM EDT ----- Inform pt CMP normal and Lipids are elevated with total 246.  LDLs 155,  HDLS are good at 68.  TGs nromal.  Ratio is not elevated.  However, when LDLs above 160, I typically refer to discussion with PCP.  She should plan to discuss with Dr. Kathyrn Lass at next visit.

## 2014-10-17 NOTE — Telephone Encounter (Signed)
Patient notified of results-see result note.//kn 

## 2014-10-30 DIAGNOSIS — Z23 Encounter for immunization: Secondary | ICD-10-CM | POA: Diagnosis not present

## 2014-11-28 DIAGNOSIS — M353 Polymyalgia rheumatica: Secondary | ICD-10-CM | POA: Diagnosis not present

## 2014-11-28 DIAGNOSIS — Z780 Asymptomatic menopausal state: Secondary | ICD-10-CM | POA: Diagnosis not present

## 2014-11-28 DIAGNOSIS — M810 Age-related osteoporosis without current pathological fracture: Secondary | ICD-10-CM | POA: Diagnosis not present

## 2014-12-21 DIAGNOSIS — H04123 Dry eye syndrome of bilateral lacrimal glands: Secondary | ICD-10-CM | POA: Diagnosis not present

## 2014-12-21 DIAGNOSIS — H25813 Combined forms of age-related cataract, bilateral: Secondary | ICD-10-CM | POA: Diagnosis not present

## 2014-12-21 DIAGNOSIS — H1851 Endothelial corneal dystrophy: Secondary | ICD-10-CM | POA: Diagnosis not present

## 2014-12-21 DIAGNOSIS — D3132 Benign neoplasm of left choroid: Secondary | ICD-10-CM | POA: Diagnosis not present

## 2015-06-14 DIAGNOSIS — Z1231 Encounter for screening mammogram for malignant neoplasm of breast: Secondary | ICD-10-CM | POA: Diagnosis not present

## 2015-06-25 DIAGNOSIS — M25562 Pain in left knee: Secondary | ICD-10-CM | POA: Diagnosis not present

## 2015-07-04 ENCOUNTER — Encounter: Payer: Self-pay | Admitting: Obstetrics & Gynecology

## 2015-07-11 DIAGNOSIS — M810 Age-related osteoporosis without current pathological fracture: Secondary | ICD-10-CM | POA: Diagnosis not present

## 2015-07-11 DIAGNOSIS — Z0000 Encounter for general adult medical examination without abnormal findings: Secondary | ICD-10-CM | POA: Diagnosis not present

## 2015-07-11 DIAGNOSIS — H9193 Unspecified hearing loss, bilateral: Secondary | ICD-10-CM | POA: Diagnosis not present

## 2015-07-11 DIAGNOSIS — Z131 Encounter for screening for diabetes mellitus: Secondary | ICD-10-CM | POA: Diagnosis not present

## 2015-07-12 DIAGNOSIS — M25562 Pain in left knee: Secondary | ICD-10-CM | POA: Diagnosis not present

## 2015-08-02 DIAGNOSIS — D1800 Hemangioma unspecified site: Secondary | ICD-10-CM | POA: Diagnosis not present

## 2015-08-02 DIAGNOSIS — D485 Neoplasm of uncertain behavior of skin: Secondary | ICD-10-CM | POA: Diagnosis not present

## 2015-08-02 DIAGNOSIS — D225 Melanocytic nevi of trunk: Secondary | ICD-10-CM | POA: Diagnosis not present

## 2015-08-02 DIAGNOSIS — L570 Actinic keratosis: Secondary | ICD-10-CM | POA: Diagnosis not present

## 2015-08-02 DIAGNOSIS — L814 Other melanin hyperpigmentation: Secondary | ICD-10-CM | POA: Diagnosis not present

## 2015-08-02 DIAGNOSIS — Z85828 Personal history of other malignant neoplasm of skin: Secondary | ICD-10-CM | POA: Diagnosis not present

## 2015-08-02 DIAGNOSIS — L821 Other seborrheic keratosis: Secondary | ICD-10-CM | POA: Diagnosis not present

## 2015-08-06 DIAGNOSIS — L720 Epidermal cyst: Secondary | ICD-10-CM | POA: Diagnosis not present

## 2015-10-11 DIAGNOSIS — L723 Sebaceous cyst: Secondary | ICD-10-CM | POA: Diagnosis not present

## 2015-10-26 DIAGNOSIS — Z23 Encounter for immunization: Secondary | ICD-10-CM | POA: Diagnosis not present

## 2015-11-06 ENCOUNTER — Encounter: Payer: Self-pay | Admitting: Obstetrics & Gynecology

## 2015-11-06 ENCOUNTER — Ambulatory Visit (INDEPENDENT_AMBULATORY_CARE_PROVIDER_SITE_OTHER): Payer: Medicare Other | Admitting: Obstetrics & Gynecology

## 2015-11-06 VITALS — BP 102/60 | HR 72 | Resp 16 | Ht 66.0 in | Wt 151.2 lb

## 2015-11-06 DIAGNOSIS — Z01419 Encounter for gynecological examination (general) (routine) without abnormal findings: Secondary | ICD-10-CM | POA: Diagnosis not present

## 2015-11-06 NOTE — Progress Notes (Signed)
75 y.o. G3P3 MarriedCaucasianF here for annual exam.  Doing well.  Denies vaginal bleeding.  Reports had to have left knee effusion drained.  Saw Dr. Rosana Hoes at [facility] ortho for this.    PCP:  Dr. Sabra Heck.  Saw her in June.  Goes every year for her wellness check.  Cholesterol was mildly elevated but ratio was good.  Vit  D was good.    Sees Dr. Amil Amen and [facility] yearly.   Patient's last menstrual period was 01/13/1993.          Sexually active: No.  The current method of family planning is post menopausal status.    Exercising: Yes.    biking, pickleball, walking Smoker:  no  Health Maintenance: Pap:  08/04/14 negative  History of abnormal Pap:  no MMG:  06/14/15 BIRADS 1 negative  Colonoscopy:  About 2016 - normal per patient, Dr. Penelope Coop.  Never has to have another one BMD:   11/28/14 osteoporosis- repeat 2 years TDaP:  2008  Pneumonia vaccine(s):  Up to date Zostavax:   Up to date Hep C testing: not indicated  Screening Labs: discuss with provider, Hb today: none, Urine today: discuss with provider   reports that she has never smoked. She has never used smokeless tobacco. She reports that she drinks about 0.6 oz of alcohol per week . She reports that she does not use drugs.  Past Medical History:  Diagnosis Date  . Carpal tunnel syndrome on right 10/08  . Elevated cholesterol 5/93  . Nephrolithiasis 2010  . Osteoporosis   . Polymyalgia (Divide) 2/97    Past Surgical History:  Procedure Laterality Date  . BLEPHAROPLASTY Bilateral 2/14   Dr. Isidoro Donning  . CARPAL TUNNEL RELEASE Right 6/08  . CERVICAL DISCECTOMY  12/91  . KNEE SURGERY Left   . spots removed     frozen and biopsied-all benign  . TONSILLECTOMY AND ADENOIDECTOMY      Current Outpatient Prescriptions  Medication Sig Dispense Refill  . Cholecalciferol (VITAMIN D PO) Take 1,000 Int'l Units by mouth.    . Multiple Vitamins-Minerals (MULTIVITAMIN PO) Take by mouth daily.    . Omega-3 Fatty Acids (FISH OIL PO)  Take by mouth.     No current facility-administered medications for this visit.     Family History  Problem Relation Age of Onset  . Hypertension Mother   . Thyroid disease Mother   . Stroke Father     ROS:  Pertinent items are noted in HPI.  Otherwise, a comprehensive ROS was negative.  Exam:   BP 102/60 (BP Location: Right Arm, Patient Position: Sitting, Cuff Size: Normal)   Pulse 72   Resp 16   Ht 5\' 6"  (1.676 m)   Wt 151 lb 3.2 oz (68.6 kg)   LMP 01/13/1993   BMI 24.40 kg/m   Weight change:   Height: 5\' 6"  (167.6 cm)  Ht Readings from Last 3 Encounters:  11/06/15 5\' 6"  (1.676 m)  08/04/14 5' 5.75" (1.67 m)  07/08/13 5' 6.25" (1.683 m)   General appearance: alert, cooperative and appears stated age Head: Normocephalic, without obvious abnormality, atraumatic Neck: no adenopathy, supple, symmetrical, trachea midline and thyroid normal to inspection and palpation Lungs: clear to auscultation bilaterally Breasts: normal appearance, no masses or tenderness Heart: regular rate and rhythm Abdomen: soft, non-tender; bowel sounds normal; no masses,  no organomegaly Extremities: extremities normal, atraumatic, no cyanosis or edema Skin: Skin color, texture, turgor normal. No rashes or lesions Lymph nodes: Cervical, supraclavicular,  and axillary nodes normal. No abnormal inguinal nodes palpated Neurologic: Grossly normal   Pelvic: External genitalia:  no lesions              Urethra:  normal appearing urethra with no masses, tenderness or lesions              Bartholins and Skenes: normal                 Vagina: normal appearing vagina with normal color and discharge, no lesions              Cervix: no lesions              Pap taken: No. Bimanual Exam:  Uterus:  normal size, contour, position, consistency, mobility, non-tender              Adnexa: normal adnexa and no mass, fullness, tenderness               Rectovaginal: Confirms               Anus:  normal sphincter  tone, no lesions  Chaperone was present for exam.  A:  Well Woman with normal exam  Vaginal atrophy  Osteoporsis  Polymyalgia rheumatica H/O nephrolithiasis   P:  Mammogram guidelines reviewed.  Pt is doing this yearly.   pap smear 2016.  No pap today Lab work done with Dr. Kathyrn Lass in June. Return annually or prn

## 2015-11-28 DIAGNOSIS — M810 Age-related osteoporosis without current pathological fracture: Secondary | ICD-10-CM | POA: Diagnosis not present

## 2015-11-28 DIAGNOSIS — M353 Polymyalgia rheumatica: Secondary | ICD-10-CM | POA: Diagnosis not present

## 2015-12-09 DIAGNOSIS — J069 Acute upper respiratory infection, unspecified: Secondary | ICD-10-CM | POA: Diagnosis not present

## 2015-12-12 DIAGNOSIS — J019 Acute sinusitis, unspecified: Secondary | ICD-10-CM | POA: Diagnosis not present

## 2015-12-12 DIAGNOSIS — J209 Acute bronchitis, unspecified: Secondary | ICD-10-CM | POA: Diagnosis not present

## 2015-12-31 DIAGNOSIS — B029 Zoster without complications: Secondary | ICD-10-CM | POA: Diagnosis not present

## 2016-01-09 DIAGNOSIS — H1851 Endothelial corneal dystrophy: Secondary | ICD-10-CM | POA: Diagnosis not present

## 2016-01-09 DIAGNOSIS — H524 Presbyopia: Secondary | ICD-10-CM | POA: Diagnosis not present

## 2016-01-09 DIAGNOSIS — H25813 Combined forms of age-related cataract, bilateral: Secondary | ICD-10-CM | POA: Diagnosis not present

## 2016-01-09 DIAGNOSIS — D3132 Benign neoplasm of left choroid: Secondary | ICD-10-CM | POA: Diagnosis not present

## 2016-01-29 DIAGNOSIS — H25812 Combined forms of age-related cataract, left eye: Secondary | ICD-10-CM | POA: Diagnosis not present

## 2016-02-19 DIAGNOSIS — H268 Other specified cataract: Secondary | ICD-10-CM | POA: Diagnosis not present

## 2016-02-19 DIAGNOSIS — H25811 Combined forms of age-related cataract, right eye: Secondary | ICD-10-CM | POA: Diagnosis not present

## 2016-04-07 DIAGNOSIS — D3132 Benign neoplasm of left choroid: Secondary | ICD-10-CM | POA: Diagnosis not present

## 2016-04-07 DIAGNOSIS — H02401 Unspecified ptosis of right eyelid: Secondary | ICD-10-CM | POA: Diagnosis not present

## 2016-06-16 DIAGNOSIS — Z1231 Encounter for screening mammogram for malignant neoplasm of breast: Secondary | ICD-10-CM | POA: Diagnosis not present

## 2016-07-04 ENCOUNTER — Encounter: Payer: Self-pay | Admitting: Obstetrics & Gynecology

## 2016-07-14 DIAGNOSIS — Z0000 Encounter for general adult medical examination without abnormal findings: Secondary | ICD-10-CM | POA: Diagnosis not present

## 2016-07-14 DIAGNOSIS — Z131 Encounter for screening for diabetes mellitus: Secondary | ICD-10-CM | POA: Diagnosis not present

## 2016-07-14 DIAGNOSIS — M810 Age-related osteoporosis without current pathological fracture: Secondary | ICD-10-CM | POA: Diagnosis not present

## 2016-07-14 DIAGNOSIS — H9193 Unspecified hearing loss, bilateral: Secondary | ICD-10-CM | POA: Diagnosis not present

## 2016-08-04 DIAGNOSIS — Z85828 Personal history of other malignant neoplasm of skin: Secondary | ICD-10-CM | POA: Diagnosis not present

## 2016-08-04 DIAGNOSIS — L570 Actinic keratosis: Secondary | ICD-10-CM | POA: Diagnosis not present

## 2016-08-04 DIAGNOSIS — L814 Other melanin hyperpigmentation: Secondary | ICD-10-CM | POA: Diagnosis not present

## 2016-08-04 DIAGNOSIS — L821 Other seborrheic keratosis: Secondary | ICD-10-CM | POA: Diagnosis not present

## 2016-08-04 DIAGNOSIS — L820 Inflamed seborrheic keratosis: Secondary | ICD-10-CM | POA: Diagnosis not present

## 2016-08-04 DIAGNOSIS — D225 Melanocytic nevi of trunk: Secondary | ICD-10-CM | POA: Diagnosis not present

## 2016-08-04 DIAGNOSIS — D1800 Hemangioma unspecified site: Secondary | ICD-10-CM | POA: Diagnosis not present

## 2016-10-13 DIAGNOSIS — Z23 Encounter for immunization: Secondary | ICD-10-CM | POA: Diagnosis not present

## 2016-12-01 DIAGNOSIS — M810 Age-related osteoporosis without current pathological fracture: Secondary | ICD-10-CM | POA: Diagnosis not present

## 2016-12-01 DIAGNOSIS — M8589 Other specified disorders of bone density and structure, multiple sites: Secondary | ICD-10-CM | POA: Diagnosis not present

## 2016-12-10 DIAGNOSIS — M810 Age-related osteoporosis without current pathological fracture: Secondary | ICD-10-CM | POA: Diagnosis not present

## 2016-12-10 DIAGNOSIS — M353 Polymyalgia rheumatica: Secondary | ICD-10-CM | POA: Diagnosis not present

## 2016-12-10 DIAGNOSIS — Z6822 Body mass index (BMI) 22.0-22.9, adult: Secondary | ICD-10-CM | POA: Diagnosis not present

## 2016-12-19 DIAGNOSIS — H903 Sensorineural hearing loss, bilateral: Secondary | ICD-10-CM | POA: Diagnosis not present

## 2017-03-05 ENCOUNTER — Other Ambulatory Visit: Payer: Self-pay

## 2017-03-05 ENCOUNTER — Encounter: Payer: Self-pay | Admitting: Obstetrics & Gynecology

## 2017-03-05 ENCOUNTER — Ambulatory Visit (INDEPENDENT_AMBULATORY_CARE_PROVIDER_SITE_OTHER): Payer: Medicare Other | Admitting: Obstetrics & Gynecology

## 2017-03-05 ENCOUNTER — Other Ambulatory Visit (HOSPITAL_COMMUNITY)
Admission: RE | Admit: 2017-03-05 | Discharge: 2017-03-05 | Disposition: A | Discharge status: Home / Self Care | Payer: Medicare Other | Source: Ambulatory Visit | Attending: Obstetrics & Gynecology | Admitting: Obstetrics & Gynecology

## 2017-03-05 VITALS — BP 118/60 | HR 64 | Resp 16 | Ht 66.0 in | Wt 144.0 lb

## 2017-03-05 DIAGNOSIS — Z124 Encounter for screening for malignant neoplasm of cervix: Secondary | ICD-10-CM | POA: Insufficient documentation

## 2017-03-05 DIAGNOSIS — Z01419 Encounter for gynecological examination (general) (routine) without abnormal findings: Secondary | ICD-10-CM

## 2017-03-05 NOTE — Progress Notes (Signed)
77 y.o. G3P3 MarriedCaucasianF here for annual exam.  Doing well.  Denies vaginal bleeding.  Had shingles last year.  Went to have this checked immediately.    Working on weight loss due to husband's mildly elevated HbA1C.    Patient's last menstrual period was 01/13/1993.          Sexually active: No.  The current method of family planning is post menopausal status.    Exercising: Yes.    walking Smoker:  no  Health Maintenance: Pap:  08/04/14 Neg   04/08/12 neg  History of abnormal Pap:  no MMG:  06/16/16 BIRADS1:Neg  Colonoscopy:  07/2014 Normal. Dr. Penelope Coop.  No f/u needed  BMD:   12/01/16 Osteoporosis.  Hip was a little worse and spine was mildly improved.   TDaP:  Current Pneumonia vaccine(s):  Done  Shingrix: Zostavax done.  Had shingles last year.    Hep C testing: n/a Screening Labs: PCP   reports that  has never smoked. she has never used smokeless tobacco. She reports that she drinks about 0.6 oz of alcohol per week. She reports that she does not use drugs.  Past Medical History:  Diagnosis Date  . Carpal tunnel syndrome on right 10/08  . Elevated cholesterol 5/93  . Nephrolithiasis 2010  . Osteoporosis   . 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 BLEPHAROPLASTY Bilateral 2/14   Dr. Isidoro Donning  . CARPAL TUNNEL RELEASE Right 6/08  . CERVICAL DISCECTOMY  12/91  . KNEE SURGERY Left   . spots removed     frozen and biopsied-all benign  . TONSILLECTOMY AND ADENOIDECTOMY      Current Outpatient Medications  Medication Sig Dispense Refill  . Cholecalciferol (VITAMIN D PO) Take 1,000 Int'l Units by mouth.    . Multiple Vitamins-Minerals (MULTIVITAMIN PO) Take by mouth daily.    . Omega-3 Fatty Acids (FISH OIL PO) Take by mouth.     No current facility-administered medications for this visit.     Family History  Problem Relation Age of Onset  . Hypertension Mother   . Thyroid disease Mother   . Stroke Father     ROS:   Pertinent items are noted in HPI.  Otherwise, a comprehensive ROS was negative.  Exam:   BP 118/60 (BP Location: Right Arm, Patient Position: Sitting, Cuff Size: Normal)   Pulse 64   Resp 16   Ht 5\' 6"  (1.676 m)   Wt 144 lb (65.3 kg)   LMP 01/13/1993   BMI 23.24 kg/m   Weight change: -7#   Height: 5\' 6"  (167.6 cm)  Ht Readings from Last 3 Encounters:  03/05/17 5\' 6"  (1.676 m)  11/06/15 5\' 6"  (1.676 m)  08/04/14 5' 5.75" (1.67 m)    General appearance: alert, cooperative and appears stated age Head: Normocephalic, without obvious abnormality, atraumatic Neck: no adenopathy, supple, symmetrical, trachea midline and thyroid normal to inspection and palpation Lungs: clear to auscultation bilaterally Breasts: normal appearance, no masses or tenderness Heart: regular rate and rhythm Abdomen: soft, non-tender; bowel sounds normal; no masses,  no organomegaly Extremities: extremities normal, atraumatic, no cyanosis or edema Skin: Skin color, texture, turgor normal. No rashes or lesions Lymph nodes: Cervical, supraclavicular, and axillary nodes normal. No abnormal inguinal nodes palpated Neurologic: Grossly normal   Pelvic: External genitalia:  no lesions              Urethra:  normal appearing urethra with no masses, tenderness  or lesions              Bartholins and Skenes: normal                 Vagina: normal appearing vagina with normal color and discharge, no lesions              Cervix: no lesions              Pap taken: Yes.   Bimanual Exam:  Uterus:  normal size, contour, position, consistency, mobility, non-tender              Adnexa: normal adnexa and no mass, fullness, tenderness               Rectovaginal: Confirms               Anus:  normal sphincter tone, no lesions  Chaperone was present for exam.  A:  Well Woman with normal exam PMP, no HRT Polymyalgia.  Sees Dr. Amil Amen yearly. H/O nephrolithiasis  Osteoporosis, stable BMD with last testing.    P:    Mammogram guidelines reviewed.  Doing 3D MMG. pap smear obtained today.  Desires pap today.  D/w pt guidelines.  Vaccines and lab work are UTD return annually or prn

## 2017-03-06 ENCOUNTER — Encounter: Payer: Self-pay | Admitting: Certified Nurse Midwife

## 2017-03-06 LAB — CYTOLOGY - PAP: Diagnosis: NEGATIVE

## 2017-04-09 DIAGNOSIS — H04123 Dry eye syndrome of bilateral lacrimal glands: Secondary | ICD-10-CM | POA: Diagnosis not present

## 2017-04-09 DIAGNOSIS — H52203 Unspecified astigmatism, bilateral: Secondary | ICD-10-CM | POA: Diagnosis not present

## 2017-04-09 DIAGNOSIS — D3132 Benign neoplasm of left choroid: Secondary | ICD-10-CM | POA: Diagnosis not present

## 2017-04-09 DIAGNOSIS — H02403 Unspecified ptosis of bilateral eyelids: Secondary | ICD-10-CM | POA: Diagnosis not present

## 2017-04-27 DIAGNOSIS — M545 Low back pain: Secondary | ICD-10-CM | POA: Diagnosis not present

## 2017-05-05 ENCOUNTER — Inpatient Hospital Stay (HOSPITAL_COMMUNITY)
Admission: EM | Admit: 2017-05-05 | Discharge: 2017-05-08 | DRG: 470 | Disposition: A | Discharge status: Home / Self Care | Payer: Medicare Other | Attending: Family Medicine | Admitting: Family Medicine

## 2017-05-05 ENCOUNTER — Encounter (HOSPITAL_COMMUNITY): Payer: Self-pay

## 2017-05-05 ENCOUNTER — Emergency Department (HOSPITAL_COMMUNITY): Payer: Medicare Other

## 2017-05-05 DIAGNOSIS — M353 Polymyalgia rheumatica: Secondary | ICD-10-CM | POA: Diagnosis present

## 2017-05-05 DIAGNOSIS — Z09 Encounter for follow-up examination after completed treatment for conditions other than malignant neoplasm: Secondary | ICD-10-CM

## 2017-05-05 DIAGNOSIS — D62 Acute posthemorrhagic anemia: Secondary | ICD-10-CM | POA: Diagnosis not present

## 2017-05-05 DIAGNOSIS — M810 Age-related osteoporosis without current pathological fracture: Secondary | ICD-10-CM | POA: Diagnosis present

## 2017-05-05 DIAGNOSIS — S72002A Fracture of unspecified part of neck of left femur, initial encounter for closed fracture: Secondary | ICD-10-CM | POA: Diagnosis not present

## 2017-05-05 DIAGNOSIS — Z8249 Family history of ischemic heart disease and other diseases of the circulatory system: Secondary | ICD-10-CM | POA: Diagnosis not present

## 2017-05-05 DIAGNOSIS — M79605 Pain in left leg: Secondary | ICD-10-CM | POA: Diagnosis not present

## 2017-05-05 DIAGNOSIS — Z471 Aftercare following joint replacement surgery: Secondary | ICD-10-CM | POA: Diagnosis not present

## 2017-05-05 DIAGNOSIS — Z888 Allergy status to other drugs, medicaments and biological substances status: Secondary | ICD-10-CM

## 2017-05-05 DIAGNOSIS — S72041A Displaced fracture of base of neck of right femur, initial encounter for closed fracture: Secondary | ICD-10-CM | POA: Diagnosis not present

## 2017-05-05 DIAGNOSIS — T148XXA Other injury of unspecified body region, initial encounter: Secondary | ICD-10-CM | POA: Diagnosis not present

## 2017-05-05 DIAGNOSIS — W010XXA Fall on same level from slipping, tripping and stumbling without subsequent striking against object, initial encounter: Secondary | ICD-10-CM | POA: Diagnosis present

## 2017-05-05 DIAGNOSIS — Z973 Presence of spectacles and contact lenses: Secondary | ICD-10-CM | POA: Diagnosis not present

## 2017-05-05 DIAGNOSIS — Z96642 Presence of left artificial hip joint: Secondary | ICD-10-CM | POA: Diagnosis not present

## 2017-05-05 DIAGNOSIS — R0902 Hypoxemia: Secondary | ICD-10-CM | POA: Diagnosis not present

## 2017-05-05 DIAGNOSIS — E7800 Pure hypercholesterolemia, unspecified: Secondary | ICD-10-CM | POA: Diagnosis not present

## 2017-05-05 DIAGNOSIS — Z419 Encounter for procedure for purposes other than remedying health state, unspecified: Secondary | ICD-10-CM

## 2017-05-05 DIAGNOSIS — S299XXA Unspecified injury of thorax, initial encounter: Secondary | ICD-10-CM | POA: Diagnosis not present

## 2017-05-05 DIAGNOSIS — M25552 Pain in left hip: Secondary | ICD-10-CM | POA: Diagnosis not present

## 2017-05-05 DIAGNOSIS — Z87442 Personal history of urinary calculi: Secondary | ICD-10-CM | POA: Diagnosis not present

## 2017-05-05 LAB — BASIC METABOLIC PANEL
Anion gap: 13 (ref 5–15)
BUN: 19 mg/dL (ref 6–20)
CO2: 21 mmol/L — ABNORMAL LOW (ref 22–32)
Calcium: 9.5 mg/dL (ref 8.9–10.3)
Chloride: 99 mmol/L — ABNORMAL LOW (ref 101–111)
Creatinine, Ser: 0.79 mg/dL (ref 0.44–1.00)
GFR calc Af Amer: >60 mL/min (ref >60)
GFR calc non Af Amer: >60 mL/min (ref >60)
Glucose, Bld: 95 mg/dL (ref 65–99)
Potassium: 4.1 mmol/L (ref 3.5–5.1)
Sodium: 133 mmol/L — ABNORMAL LOW (ref 135–145)

## 2017-05-05 LAB — CBC
HCT: 41.1 % (ref 36.0–46.0)
Hemoglobin: 13.7 g/dL (ref 12.0–15.0)
MCH: 29.3 pg (ref 26.0–34.0)
MCHC: 33.3 g/dL (ref 30.0–36.0)
MCV: 88 fL (ref 78.0–100.0)
Platelets: 214 10*3/uL (ref 150–400)
RBC: 4.67 MIL/uL (ref 3.87–5.11)
RDW: 14.3 % (ref 11.5–15.5)
WBC: 7.5 10*3/uL (ref 4.0–10.5)

## 2017-05-05 IMAGING — CR DG HIP (WITH OR WITHOUT PELVIS) 2-3V*L*
3 series · 3 of 3 positions shown · non-contrast
Comparison: None.

CLINICAL DATA: Pain after fall.

EXAM:
DG HIP (WITH OR WITHOUT PELVIS) 2-3V LEFT

[pelvis ap]
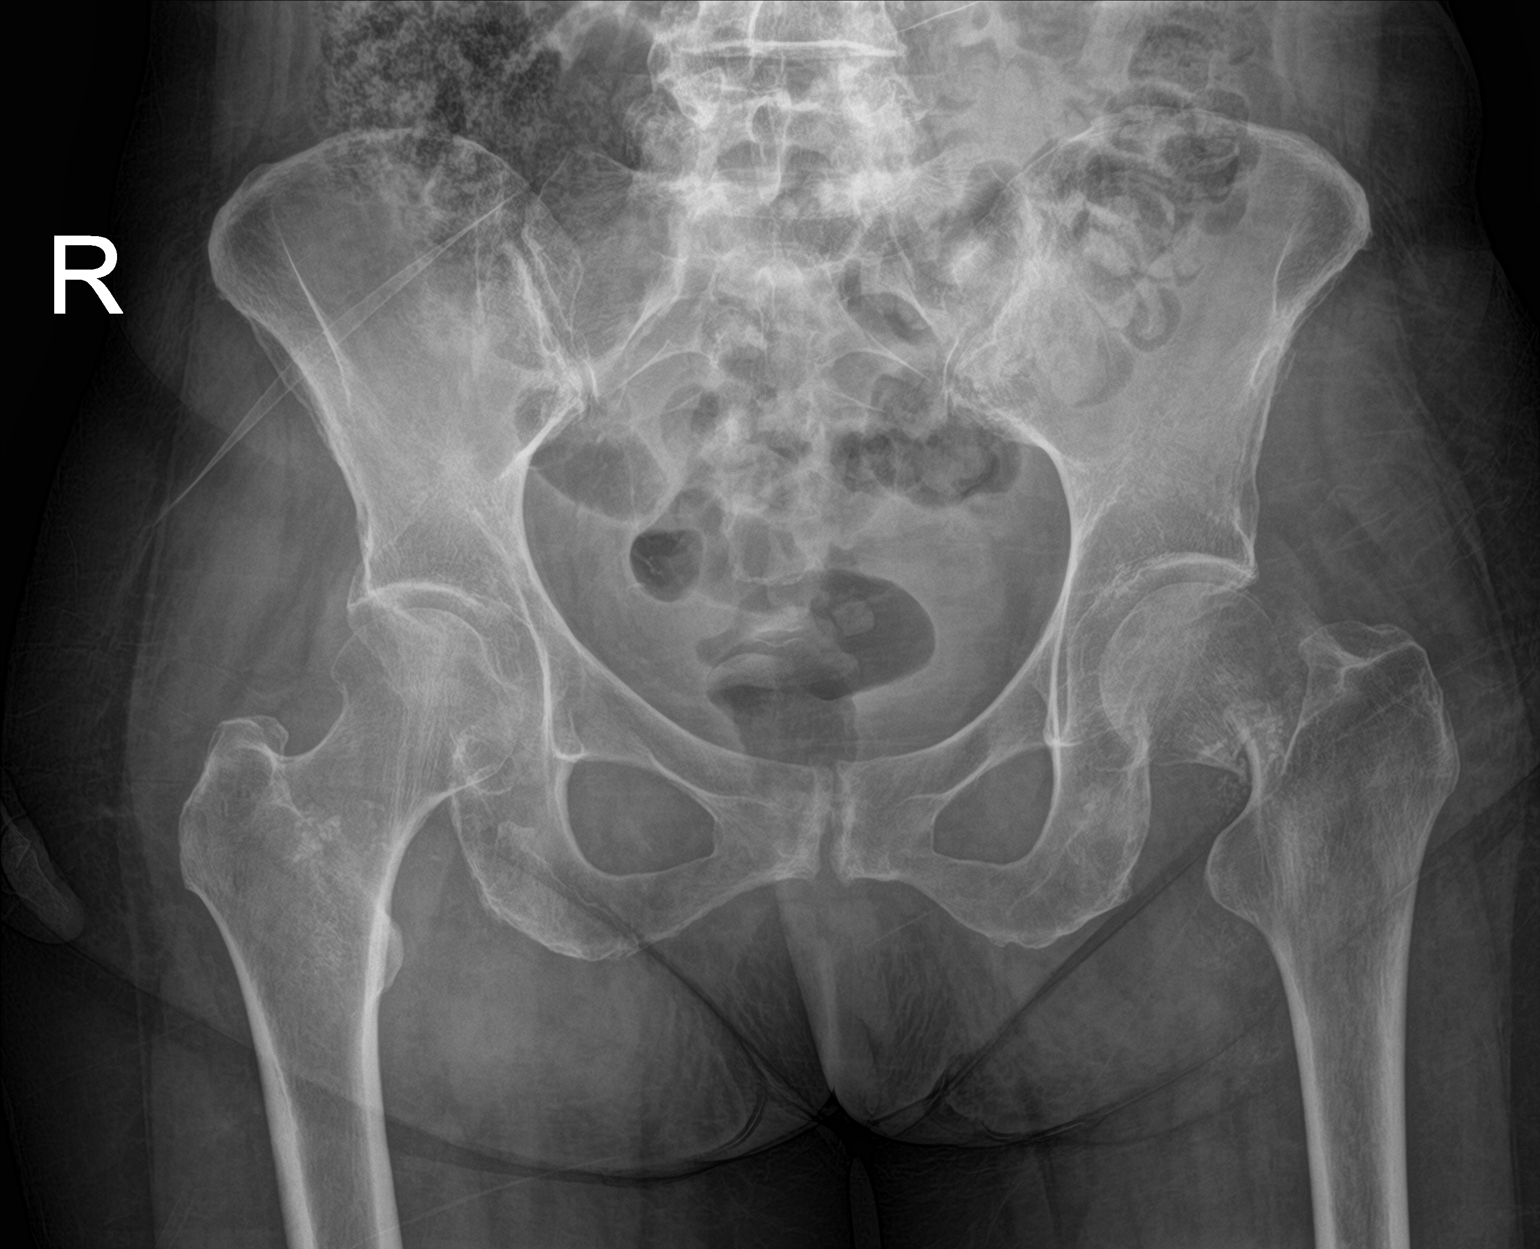

[hip ap]
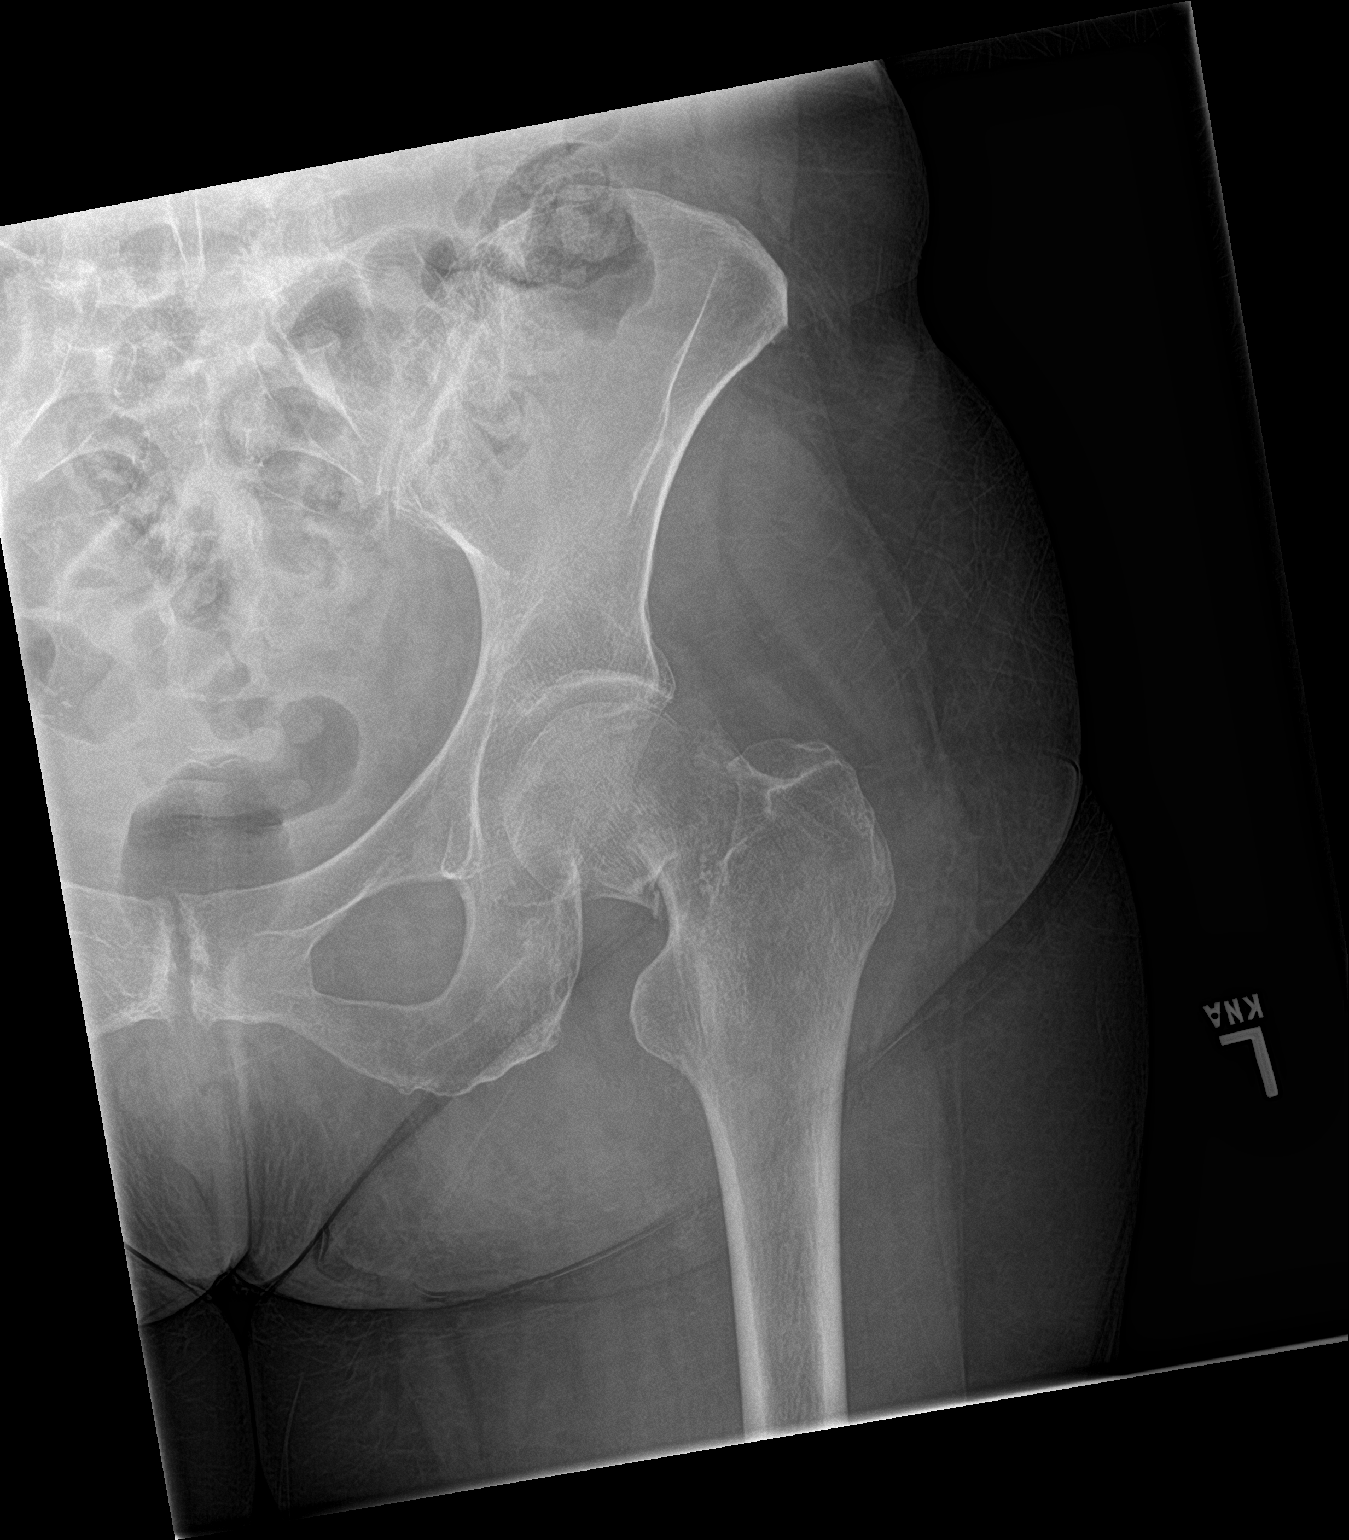

[hip x-table]
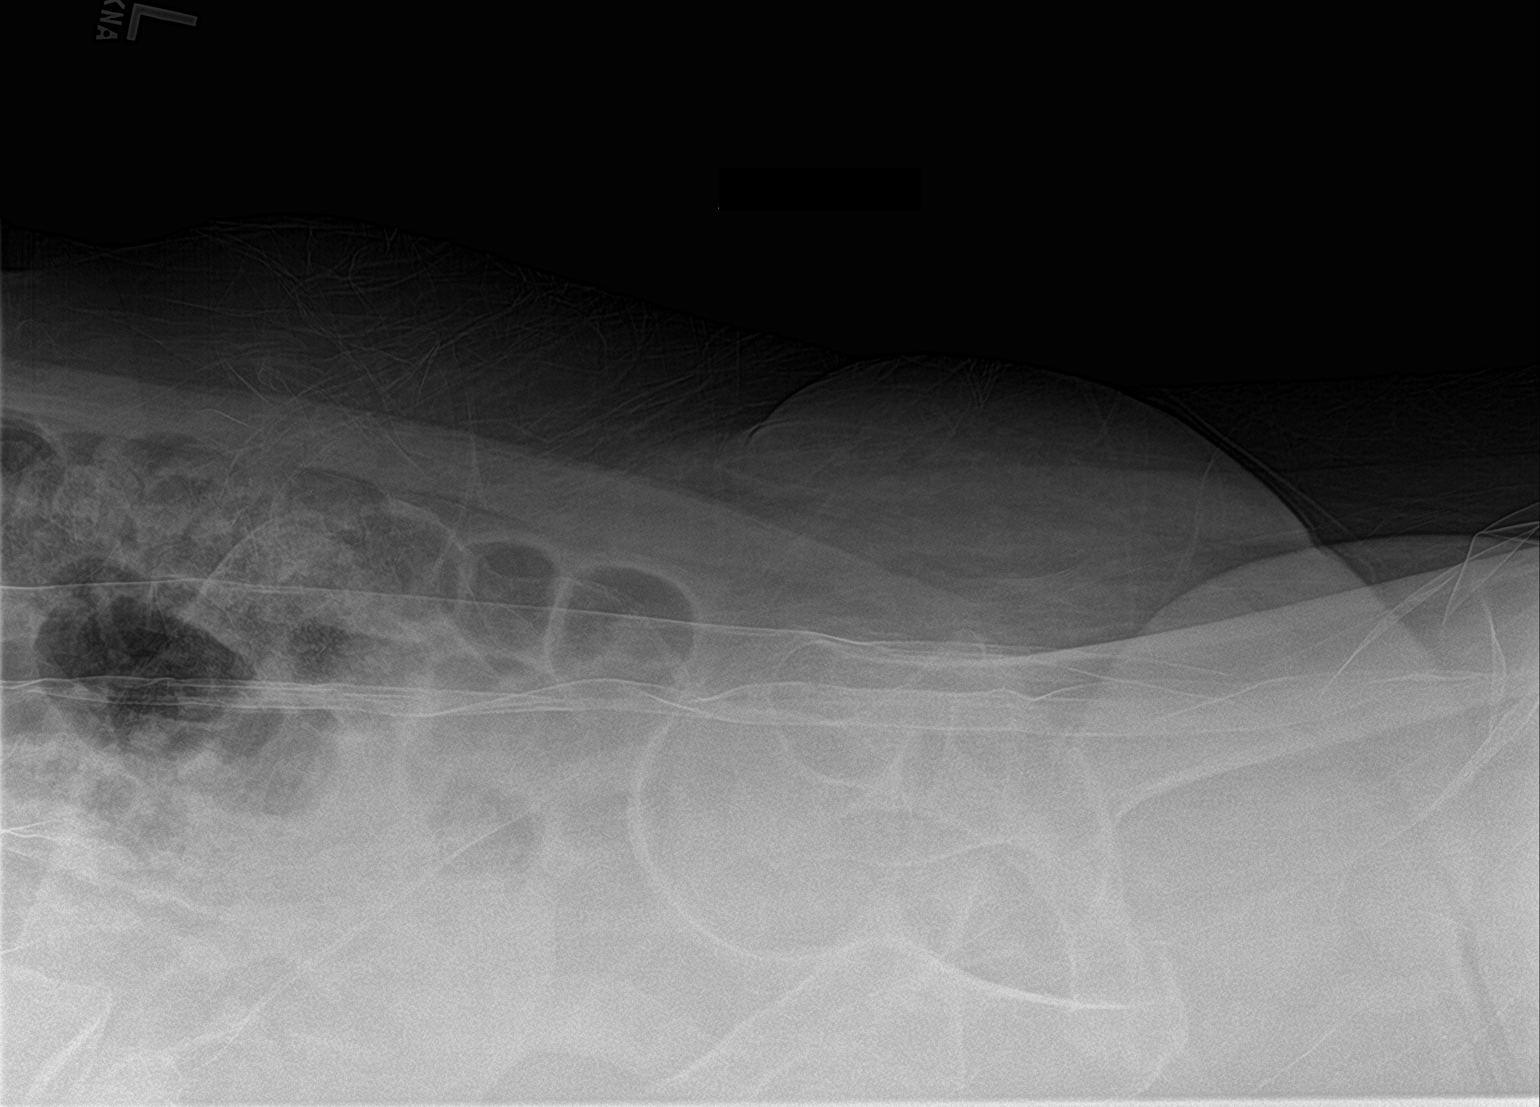

[3 of 3 positions shown; findings below may reference images not displayed]

FINDINGS: There is a fracture through the left femoral neck with moderate
displacement. No dislocation of the femoral head. No other fractures
identified.
IMPRESSION: Left femoral neck fracture with displacement.

## 2017-05-05 IMAGING — CR DG CHEST 1V
1 series · 1 of 1 positions shown · non-contrast
Comparison: None.

CLINICAL DATA: Fall.  Hip pain.

EXAM:
CHEST  1 VIEW

[chest ap]
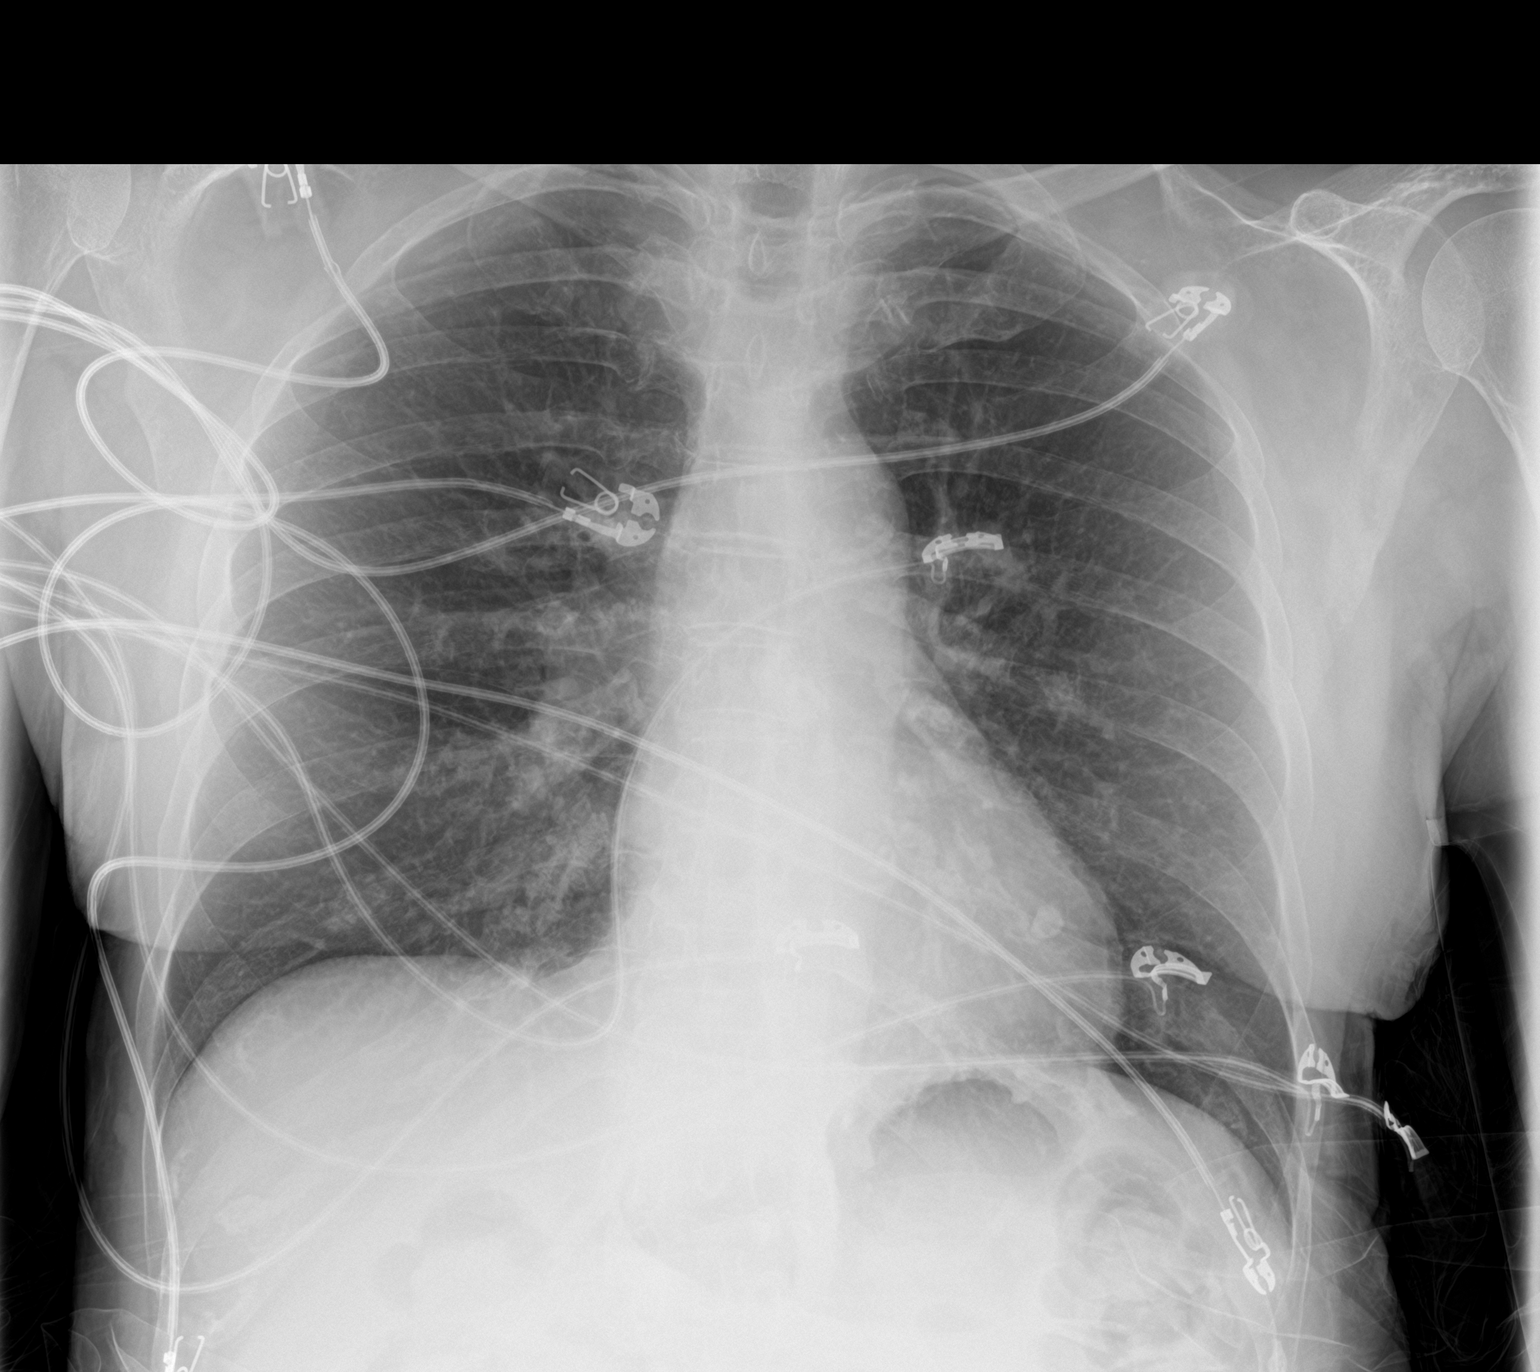

[1 of 1 positions shown; findings below may reference images not displayed]

FINDINGS: The heart size and mediastinal contours are within normal limits.
Both lungs are clear. The visualized skeletal structures are
unremarkable.
IMPRESSION: No active disease.

## 2017-05-05 MED ORDER — HYDROMORPHONE HCL 2 MG/ML IJ SOLN
0.5000 mg | INTRAMUSCULAR | Status: DC | PRN
  Administered 2017-05-06 (×3): 0.5 mg via INTRAVENOUS
  Filled 2017-05-05 (×3): qty 1

## 2017-05-05 MED ORDER — METHOCARBAMOL 500 MG PO TABS
500.0000 mg | ORAL_TABLET | Freq: Four times a day (QID) | ORAL | Status: DC | PRN
  Administered 2017-05-06 – 2017-05-07 (×3): 500 mg via ORAL
  Filled 2017-05-05 (×3): qty 1

## 2017-05-05 MED ORDER — MORPHINE SULFATE (PF) 4 MG/ML IV SOLN
4.0000 mg | Freq: Once | INTRAVENOUS | Status: AC
  Administered 2017-05-05: 4 mg via INTRAVENOUS
  Filled 2017-05-05: qty 1

## 2017-05-05 MED ORDER — SODIUM CHLORIDE 0.9 % IV SOLN
INTRAVENOUS | Status: AC
Start: 2017-05-05 — End: 2017-05-06
  Administered 2017-05-05: 23:00:00 via INTRAVENOUS

## 2017-05-05 MED ORDER — BISACODYL 5 MG PO TBEC: 5.0000 mg | DELAYED_RELEASE_TABLET | Freq: Every day | ORAL | Status: DC | PRN

## 2017-05-05 MED ORDER — ONDANSETRON HCL 4 MG/2ML IJ SOLN
4.0000 mg | Freq: Four times a day (QID) | INTRAMUSCULAR | Status: DC | PRN
  Administered 2017-05-05: 4 mg via INTRAVENOUS
  Filled 2017-05-05: qty 2

## 2017-05-05 MED ORDER — FENTANYL CITRATE (PF) 100 MCG/2ML IJ SOLN
50.0000 ug | Freq: Once | INTRAMUSCULAR | Status: AC
  Administered 2017-05-05 – 2017-05-06 (×2): 50 ug via INTRAVENOUS
  Filled 2017-05-05: qty 2

## 2017-05-05 MED ORDER — HYDROMORPHONE HCL 2 MG/ML IJ SOLN
1.0000 mg | Freq: Once | INTRAMUSCULAR | Status: AC
  Administered 2017-05-05: 1 mg via INTRAVENOUS
  Filled 2017-05-05: qty 1

## 2017-05-05 MED ORDER — METHOCARBAMOL 1000 MG/10ML IJ SOLN
500.0000 mg | Freq: Four times a day (QID) | INTRAVENOUS | Status: DC | PRN
  Filled 2017-05-05: qty 5

## 2017-05-05 MED ORDER — SENNOSIDES-DOCUSATE SODIUM 8.6-50 MG PO TABS: 1.0000 | ORAL_TABLET | Freq: Every evening | ORAL | Status: DC | PRN

## 2017-05-05 NOTE — ED Notes (Signed)
Admitting at the bedside.  

## 2017-05-05 NOTE — Progress Notes (Signed)
Patient ID: Angela [name], female   DOB: [birth year], 77 y.o.   MRN: [id number] Patient seen in the emergency room for short discussion of planned surgery tomorrow afternoon.  I had emergent surgery with an open fracture of a  child and will see patient in the morning.  Patient has displaced femoral neck fracture and plan will be left total hip arthroplasty from an anterior approach.  Surgery is posted for 2:45 PM.  Patient will be n.p.o. after midnight.  My cell phone [phone].

## 2017-05-05 NOTE — ED Provider Notes (Signed)
[facility] EMERGENCY DEPARTMENT Provider Note   CSN: [id number] Arrival date & time: 05/05/17  1737     History   Chief Complaint Chief Complaint  Patient presents with  . Hip Pain  . Fall    HPI Angela [name] is a 77 y.o. female.  77 year old female with past medical history below who presents with left hip pain.  Just prior to arrival, the patient was walking downstairs and she was at the second last step when she lost her balance and fell onto her left side.  She had an immediate onset of severe left hip pain.  She tried to stand up but the pain was too severe and she passed out, immediately woke up.  She denies any loss of consciousness or head injury during the fall.  No other areas of injury or pain.  Normal sensation in her feet.  She denies any anticoagulant use.  The history is provided by the patient.    Past Medical History:  Diagnosis Date  . Carpal tunnel syndrome on right 10/08  . Elevated cholesterol 5/93  . Nephrolithiasis 2010  . Osteoporosis   . 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 Closed left hip fracture, initial encounter ([place]) 05/05/2017  . Polymyalgia ([place]) 07/08/2013    Past Surgical History:  Procedure Laterality Date  . BLEPHAROPLASTY Bilateral 2/14   Dr. Isidoro Donning  . CARPAL TUNNEL RELEASE Right 6/08  . CERVICAL DISCECTOMY  12/91  . KNEE SURGERY Left   . spots removed     frozen and biopsied-all benign  . TONSILLECTOMY AND ADENOIDECTOMY       OB History    Gravida  3   Para  3   Term      Preterm      AB      Living  3     SAB      TAB      Ectopic      Multiple      Live Births               Home Medications    Prior to Admission medications   Medication Sig Start Date End Date Taking? Authorizing Provider  Cholecalciferol (VITAMIN D PO) Take 1,000 Int'l Units by mouth.    [provider]  Multiple Vitamins-Minerals  (MULTIVITAMIN PO) Take by mouth daily.    [provider]  Omega-3 Fatty Acids (FISH OIL PO) Take by mouth.    [provider]    Family History Family History  Problem Relation Age of Onset  . Hypertension Mother   . Thyroid disease Mother   . Stroke Father     Social History Social History   Tobacco Use  . Smoking status: Never Smoker  . Smokeless tobacco: Never Used  Substance Use Topics  . Alcohol use: Yes    Alcohol/week: 0.6 oz    Types: 1 Standard drinks or equivalent per week    Comment: 2 Drinks/month  . Drug use: No     Allergies   Statins   Review of Systems Review of Systems All other systems reviewed and are negative except that which was mentioned in HPI   Physical Exam Updated Vital Signs BP (!) 113/53   Pulse 78   Temp 97.7 F (36.5 C) (Oral)   Resp 12   Ht 5\' 6"  (1.676 m)   Wt 62.6 kg (138 lb)  LMP 01/13/1993   SpO2 96%   BMI 22.27 kg/m   Physical Exam  Constitutional: She is oriented to person, place, and time. She appears well-developed and well-nourished. No distress.  HENT:  Head: Normocephalic and atraumatic.  Moist mucous membranes  Eyes: Conjunctivae are normal.  Neck: Neck supple.  Cardiovascular: Normal rate, regular rhythm and normal heart sounds.  No murmur heard. Pulmonary/Chest: Effort normal and breath sounds normal. She exhibits no tenderness.  Abdominal: Soft. Bowel sounds are normal. She exhibits no distension. There is no tenderness.  Musculoskeletal: She exhibits no edema.  Difficult to assess leg length discrepancy as R leg flexed; no femur, knee, or ankle tenderness of L leg; 2+ DP pulses  Neurological: She is alert and oriented to person, place, and time. No sensory deficit.  Fluent speech  Skin: Skin is warm and dry.  Psychiatric: She has a normal mood and affect. Judgment normal.  Nursing note and vitals reviewed.    ED Treatments / Results  Labs (all labs ordered are listed, but only  abnormal results are displayed) Labs Reviewed  BASIC METABOLIC PANEL - Abnormal; Notable for the following components:      Result Value   Sodium 133 (*)    Chloride 99 (*)    CO2 21 (*)    All other components within normal limits  CBC    EKG EKG Interpretation  Date/Time:  Tuesday May 05 2017 18:31:56 EDT Ventricular Rate:  84 PR Interval:    QRS Duration: 92 QT Interval:  373 QTC Calculation: 441 R Axis:   -27 Text Interpretation:  Sinus rhythm Borderline left axis deviation No significant change since last tracing Confirmed by Theotis Burrow [phone]) on 05/05/2017 6:34:55 PM   Radiology Dg Chest 1 View  Result Date: 05/05/2017 CLINICAL DATA:  Fall.  Hip pain. EXAM: CHEST  1 VIEW COMPARISON:  None. FINDINGS: The heart size and mediastinal contours are within normal limits. Both lungs are clear. The visualized skeletal structures are unremarkable. IMPRESSION: No active disease. Electronically Signed   By: Dorise Bullion III M.D   On: 05/05/2017 19:10   Dg Hip Unilat With Pelvis 2-3 Views Left  Result Date: 05/05/2017 CLINICAL DATA:  Pain after fall. EXAM: DG HIP (WITH OR WITHOUT PELVIS) 2-3V LEFT COMPARISON:  None. FINDINGS: There is a fracture through the left femoral neck with moderate displacement. No dislocation of the femoral head. No other fractures identified. IMPRESSION: Left femoral neck fracture with displacement. Electronically Signed   By: Dorise Bullion III M.D   On: 05/05/2017 19:10    Procedures Procedures (including critical care time)  Medications Ordered in ED Medications  HYDROmorphone (DILAUDID) injection 0.5 mg (has no administration in time range)  methocarbamol (ROBAXIN) tablet 500 mg (has no administration in time range)    Or  methocarbamol (ROBAXIN) 500 mg in dextrose 5 % 50 mL IVPB (has no administration in time range)  senna-docusate (Senokot-S) tablet 1 tablet (has no administration in time range)  bisacodyl (DULCOLAX) EC tablet 5 mg (has  no administration in time range)  0.9 %  sodium chloride infusion (has no administration in time range)  fentaNYL (SUBLIMAZE) injection 50 mcg (50 mcg Intravenous Given 05/05/17 1828)  morphine 4 MG/ML injection 4 mg (4 mg Intravenous Given 05/05/17 2030)  HYDROmorphone (DILAUDID) injection 1 mg (1 mg Intravenous Given 05/05/17 2108)     Initial Impression / Assessment and Plan / ED Course  I have reviewed the triage vital signs and the nursing notes.  Pertinent labs & imaging results that were available during my care of the patient were reviewed by me and considered in my medical decision making (see chart for details).     Severe hip pain after fall. Neurovascularly intact distally and no other injuries on exam. Gave fentanyl, later morphine for pain. Screening labs normal. CXR negative. Hip XR shows displaced left femoral neck fracture.  Consulted orthopedics and discussed with Dr. Lorin Mercy, who will see the patient in consultation.  Discussed admission with [facility] hospitalist, Dr. Myna Hidalgo, appreciate assistance. Pt will be admitted for further management.   Final Clinical Impressions(s) / ED Diagnoses   Final diagnoses:  Closed displaced fracture of left femoral neck [facility])    ED Discharge Orders    None       Bindi Klomp, Wenda Overland, MD 05/05/17 2136

## 2017-05-05 NOTE — H&P (Signed)
History and Physical    Angela [name] UVO:[id number] DOB: 23-Dec-1940 DOA: 05/05/2017  PCP: Sigmund Hazel, MD   Patient coming from: Home  Chief Complaint: Left hip pain   HPI: Angela [name] is a 77 y.o. female who denies any significant past medical history, typically enjoying good health, and now presenting to the ED with severe left hip pain after a fall at home.  Patient had been in her usual state of good health, was having an uneventful day, and had just walked down some stairs, was on the second to last step, when she tripped and fell onto her left side.  She experienced immediate and severe pain at the left hip.  She denies hitting her head or losing consciousness with this.  She leads a fairly active lifestyle and reports ability to ascend a flight of stairs easily without dyspnea or chest pain.  ED Course: Upon arrival to the ED, patient is found to beafebrile, saturating well on room air, and with blood pressure stable.  EKG features a sinus rhythm orthopedic surgery was consulted by the ED physician and recommends a medical admission.  And chest x-ray is negative for acute cardiopulmonary disease.  Radiographs of the left hip reveal left femoral neck fracture with displacement.  Chemistry panel is notable for mild hyponatremia and CBC is unremarkable.  Patient became hypoxic after narcotic analgesia was administered in the ED, now stable with nasal cannula.  She will be admitted to the medical-surgical unit for ongoing evaluation and management of acute left hip fracture.  Review of Systems:  All other systems reviewed and apart from HPI, are negative.  Past Medical History:  Diagnosis Date  . Carpal tunnel syndrome on right 10/08  . Elevated cholesterol 5/93  . Nephrolithiasis 2010  . Osteoporosis   . Polymyalgia ([facility]) 2/97    Past Surgical History:  Procedure Laterality Date  . BLEPHAROPLASTY Bilateral 2/14   Dr. Shawna Orleans  . CARPAL TUNNEL RELEASE Right 6/08  .  CERVICAL DISCECTOMY  12/91  . KNEE SURGERY Left   . spots removed     frozen and biopsied-all benign  . TONSILLECTOMY AND ADENOIDECTOMY       reports that she has never smoked. She has never used smokeless tobacco. She reports that she drinks about 0.6 oz of alcohol per week. She reports that she does not use drugs.  Allergies  Allergen Reactions  . Statins     Family History  Problem Relation Age of Onset  . Hypertension Mother   . Thyroid disease Mother   . Stroke Father      Prior to Admission medications   Medication Sig Start Date End Date Taking? Authorizing Provider  Cholecalciferol (VITAMIN D PO) Take 1,000 Int'l Units by mouth.    [provider]  Multiple Vitamins-Minerals (MULTIVITAMIN PO) Take by mouth daily.    [provider]  Omega-3 Fatty Acids (FISH OIL PO) Take by mouth.    [provider]    Physical Exam: Vitals:   05/05/17 2045 05/05/17 2101 05/05/17 2112 05/05/17 2114  BP:  (!) 106/55    Pulse: 77 77 80 74  Resp: 11 14 (!) 6 14  Temp:      TempSrc:      SpO2: 96% 96% (!) 77% 96%  Weight:      Height:          Constitutional: NAD, calm  Eyes: PERTLA, lids and conjunctivae normal ENMT: Mucous membranes are moist. Posterior pharynx  clear of any exudate or lesions.   Neck: normal, supple, no masses, no thyromegaly Respiratory: clear to auscultation bilaterally, no wheezing, no crackles. Normal respiratory effort.   Cardiovascular: S1 & S2 heard, regular rate and rhythm. No extremity edema. No significant JVD. Abdomen: No distension, no tenderness, soft. Bowel sounds normal.  Musculoskeletal: no clubbing / cyanosis. Left hip exquisitely tender; neurovascularly intact distally.    Skin: no significant rashes, lesions, ulcers. Warm, dry, well-perfused. Neurologic: CN 2-12 grossly intact. Sensation intact. Strength 5/5 in all 4 limbs.  Psychiatric: Alert and oriented x 3. Pleasant, cooperative.     Labs on Admission: I  have personally reviewed following labs and imaging studies  CBC: Recent Labs  Lab 05/05/17 1830  WBC 7.5  HGB 13.7  HCT 41.1  MCV 88.0  PLT 214   Basic Metabolic Panel: Recent Labs  Lab 05/05/17 1830  NA 133*  K 4.1  CL 99*  CO2 21*  GLUCOSE 95  BUN 19  CREATININE 0.79  CALCIUM 9.5   GFR: Estimated Creatinine Clearance: 56.9 mL/min (by C-G formula based on SCr of 0.79 mg/dL). Liver Function Tests: No results for input(s): AST, ALT, ALKPHOS, BILITOT, PROT, ALBUMIN in the last 168 hours. No results for input(s): LIPASE, AMYLASE in the last 168 hours. No results for input(s): AMMONIA in the last 168 hours. Coagulation Profile: No results for input(s): INR, PROTIME in the last 168 hours. Cardiac Enzymes: No results for input(s): CKTOTAL, CKMB, CKMBINDEX, TROPONINI in the last 168 hours. BNP (last 3 results) No results for input(s): PROBNP in the last 8760 hours. HbA1C: No results for input(s): HGBA1C in the last 72 hours. CBG: No results for input(s): GLUCAP in the last 168 hours. Lipid Profile: No results for input(s): CHOL, HDL, LDLCALC, TRIG, CHOLHDL, LDLDIRECT in the last 72 hours. Thyroid Function Tests: No results for input(s): TSH, T4TOTAL, FREET4, T3FREE, THYROIDAB in the last 72 hours. Anemia Panel: No results for input(s): VITAMINB12, FOLATE, FERRITIN, TIBC, IRON, RETICCTPCT in the last 72 hours. Urine analysis:    Component Value Date/Time   BILIRUBINUR - 08/04/2014 1045   PROTEINUR - 08/04/2014 1045   UROBILINOGEN negative 08/04/2014 1045   NITRITE - 08/04/2014 1045   LEUKOCYTESUR Negative 08/04/2014 1045   Sepsis Labs: @LABRCNTIP (procalcitonin:4,lacticidven:4) )No results found for this or any previous visit (from the past 240 hour(s)).   Radiological Exams on Admission: Dg Chest 1 View  Result Date: 05/05/2017 CLINICAL DATA:  Fall.  Hip pain. EXAM: CHEST  1 VIEW COMPARISON:  None. FINDINGS: The heart size and mediastinal contours are within  normal limits. Both lungs are clear. The visualized skeletal structures are unremarkable. IMPRESSION: No active disease. Electronically Signed   By: Gerome Sam III M.D   On: 05/05/2017 19:10   Dg Hip Unilat With Pelvis 2-3 Views Left  Result Date: 05/05/2017 CLINICAL DATA:  Pain after fall. EXAM: DG HIP (WITH OR WITHOUT PELVIS) 2-3V LEFT COMPARISON:  None. FINDINGS: There is a fracture through the left femoral neck with moderate displacement. No dislocation of the femoral head. No other fractures identified. IMPRESSION: Left femoral neck fracture with displacement. Electronically Signed   By: Gerome Sam III M.D   On: 05/05/2017 19:10    EKG: Independently reviewed. Sinus rhythm.   Assessment/Plan   1. Left hip fracture  - Presents with severe left hip pain after a mechanical fall at home  - Radiographs reveal left femoral neck fracture  - Orthopedic surgery is consulting and much appreciated  -  Based on the available data, Angela [name] presents an estimated 0.2% risk of perioperative MI or cardiac arrest per Nolon Nations al; this is low-risk    - Keep NPO after midnight, provide IVF hydration, continue prn analgesia and supportive care     DVT prophylaxis: SCD's  Code Status: Full  Family Communication: Discussed with patient Consults called: Orthopedic surgery Admission status: Inpatient    Briscoe Deutscher, MD [facility] Pager [phone]  If 7PM-7AM, please contact night-coverage [email or web address] Password [email or web address]  05/05/2017, 9:28 PM

## 2017-05-05 NOTE — ED Triage Notes (Signed)
Pt from home via EMS for L hip pain secondary to fall. Per EMS, pt was walking down steps when she lost her footing and fell onto her L hip. Pt reports upper lateral L thigh and hip pain, worse with palpation. No deformity, rotation, or shortening noted. Pelvis binded by EMS, pt reported some relief. Denies LOC, neck/back pain. Pt not on blood thinners. A&Ox4. EMS VS: 177/70, HR 70, 16 RR, 98% on RA.

## 2017-05-05 NOTE — ED Notes (Signed)
Pt actively vomiting at this time.

## 2017-05-05 NOTE — ED Notes (Signed)
Pt O2 sat decreased to 77% on RA after ordered dilaudid administration. Pt placed on 2L [place], O2 increased to 97%

## 2017-05-06 ENCOUNTER — Inpatient Hospital Stay (HOSPITAL_COMMUNITY): Payer: Medicare Other

## 2017-05-06 ENCOUNTER — Inpatient Hospital Stay (HOSPITAL_COMMUNITY): Payer: Medicare Other | Admitting: Certified Registered Nurse Anesthetist

## 2017-05-06 ENCOUNTER — Other Ambulatory Visit: Payer: Self-pay

## 2017-05-06 ENCOUNTER — Encounter (HOSPITAL_COMMUNITY): Payer: Self-pay | Admitting: Certified Registered Nurse Anesthetist

## 2017-05-06 ENCOUNTER — Encounter (HOSPITAL_COMMUNITY)
Admission: EM | Disposition: A | Discharge status: Home / Self Care | Payer: Self-pay | Source: Home / Self Care | Attending: Family Medicine

## 2017-05-06 DIAGNOSIS — S72041A Displaced fracture of base of neck of right femur, initial encounter for closed fracture: Secondary | ICD-10-CM

## 2017-05-06 HISTORY — PX: TOTAL HIP ARTHROPLASTY: SHX124

## 2017-05-06 LAB — BASIC METABOLIC PANEL
Anion gap: 8 (ref 5–15)
BUN: 16 mg/dL (ref 6–20)
CO2: 24 mmol/L (ref 22–32)
Calcium: 8.7 mg/dL — ABNORMAL LOW (ref 8.9–10.3)
Chloride: 103 mmol/L (ref 101–111)
Creatinine, Ser: 0.67 mg/dL (ref 0.44–1.00)
GFR calc Af Amer: >60 mL/min (ref >60)
GFR calc non Af Amer: >60 mL/min (ref >60)
Glucose, Bld: 103 mg/dL — ABNORMAL HIGH (ref 65–99)
Potassium: 3.9 mmol/L (ref 3.5–5.1)
Sodium: 135 mmol/L (ref 135–145)

## 2017-05-06 LAB — PROTIME-INR
INR: 0.99
Prothrombin Time: 13 seconds (ref 11.4–15.2)

## 2017-05-06 LAB — SURGICAL PCR SCREEN
MRSA, PCR: NEGATIVE
Staphylococcus aureus: NEGATIVE

## 2017-05-06 LAB — CBC
HCT: 39.8 % (ref 36.0–46.0)
Hemoglobin: 13.2 g/dL (ref 12.0–15.0)
MCH: 29.5 pg (ref 26.0–34.0)
MCHC: 33.2 g/dL (ref 30.0–36.0)
MCV: 89 fL (ref 78.0–100.0)
Platelets: 196 10*3/uL (ref 150–400)
RBC: 4.47 MIL/uL (ref 3.87–5.11)
RDW: 14.5 % (ref 11.5–15.5)
WBC: 9.5 10*3/uL (ref 4.0–10.5)

## 2017-05-06 LAB — APTT: aPTT: 31 seconds (ref 24–36)

## 2017-05-06 IMAGING — RF DG C-ARM 61-120 MIN
1 series · 2 of 2 positions shown · non-contrast
Comparison: [DATE]

CLINICAL DATA: Left hip replacement

EXAM:
OPERATIVE left HIP (WITH PELVIS IF PERFORMED) chew VIEWS
TECHNIQUE: Fluoroscopic spot image(s) were submitted for interpretation
post-operatively.

[Series 1: run · 2 of 2 slices shown]
[im 1/2]
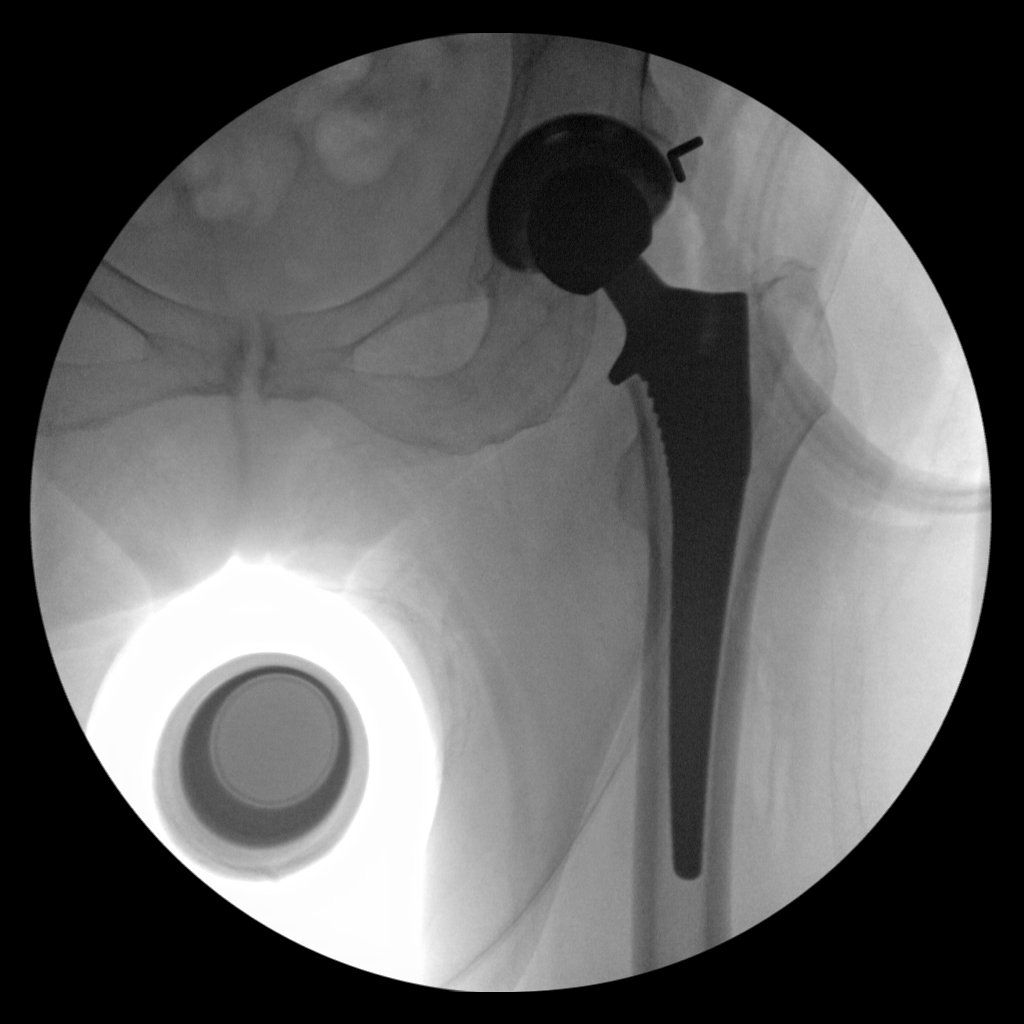
[im 2/2]
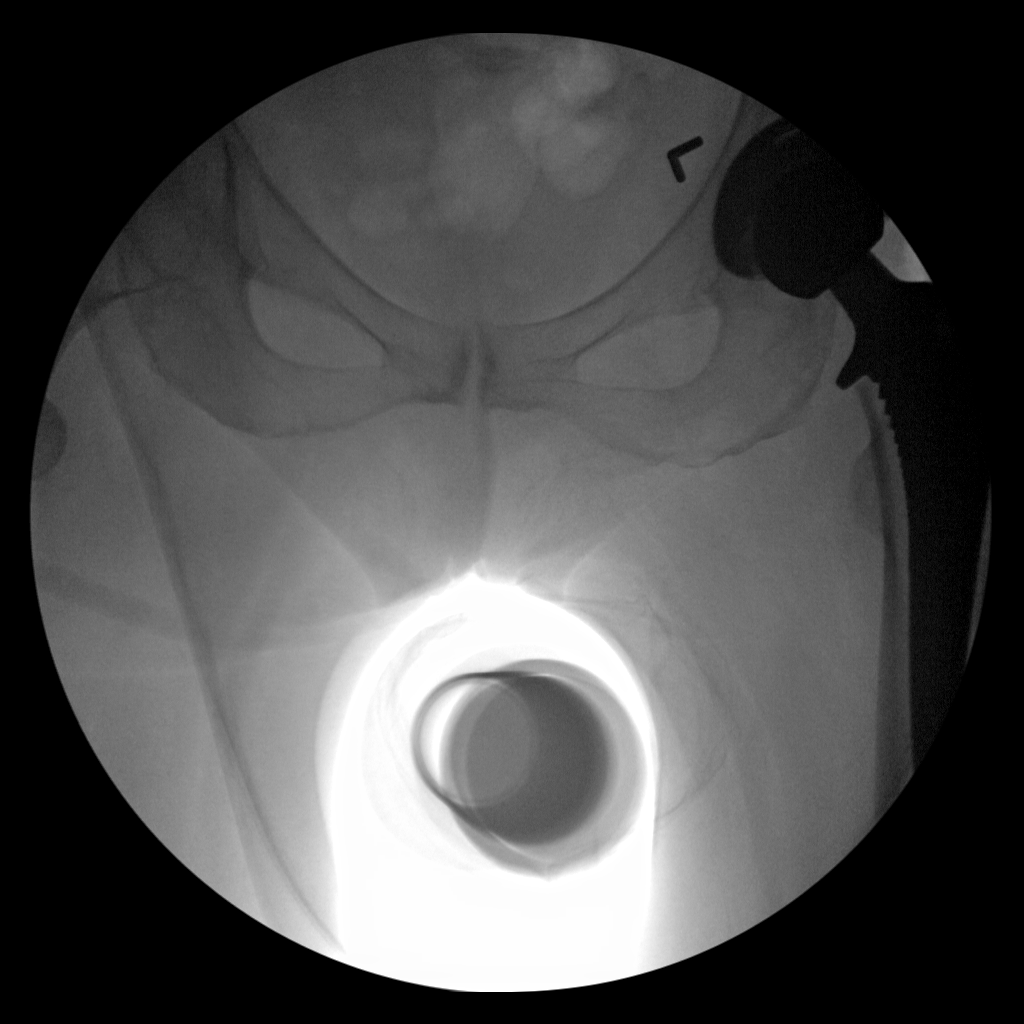

[2 of 2 positions shown; findings below may reference images not displayed]

FINDINGS: Two low resolution intraoperative spot views of the left hip. Total
fluoroscopy time was 22 seconds. Images demonstrate left hip
replacement with normal alignment.
IMPRESSION: Intraoperative fluoroscopic assistance provided during left hip
replacement

## 2017-05-06 IMAGING — CR DG HIP (WITH OR WITHOUT PELVIS) 1V PORT*L*
1 series · 1 of 1 positions shown · non-contrast
Comparison: None.

CLINICAL DATA: Left anterior total hip arthroplasty

EXAM:
DG C-ARM 61-120 MIN; DG HIP (WITH OR WITHOUT PELVIS) 1V PORT LEFT

[AP]
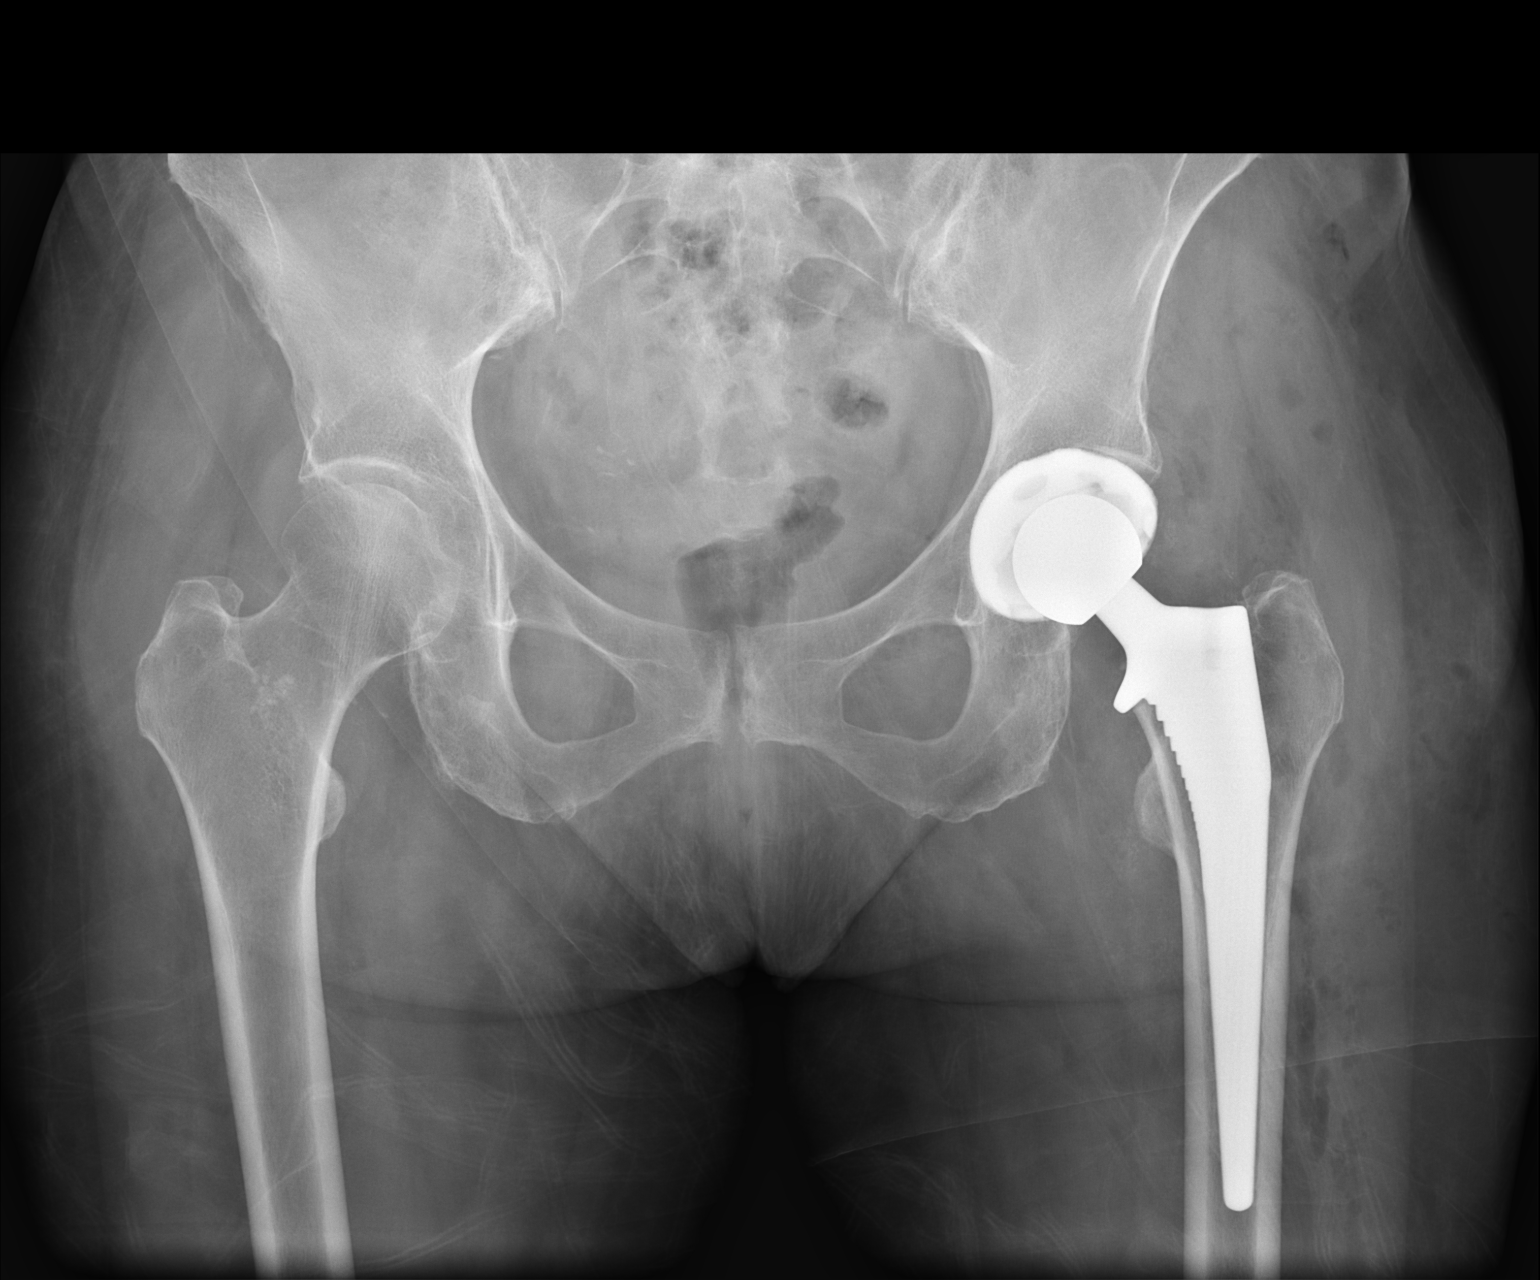

[1 of 1 positions shown; findings below may reference images not displayed]

FINDINGS: Two C-arm fluoroscopic views acquired intraoperatively. Uncemented
left hip arthroplasty without immediate intraoperative
complications. No hardware failure or fracture is identified given
limitations of the fluoroscopic technique.
IMPRESSION: Intact uncemented left hip arthroplasty.

## 2017-05-06 SURGERY — ARTHROPLASTY, HIP, TOTAL,POSTERIOR APPROACH
Anesthesia: Spinal | Site: Hip | Laterality: Left

## 2017-05-06 MED ORDER — MEPERIDINE HCL 50 MG/ML IJ SOLN: 6.2500 mg | INTRAMUSCULAR | Status: DC | PRN

## 2017-05-06 MED ORDER — PHENOL 1.4 % MT LIQD
1.0000 | OROMUCOSAL | Status: DC | PRN
Start: 2017-05-06 — End: 2017-05-08

## 2017-05-06 MED ORDER — HYDROMORPHONE HCL 2 MG/ML IJ SOLN: 0.5000 mg | INTRAMUSCULAR | Status: DC | PRN

## 2017-05-06 MED ORDER — PHENYLEPHRINE 40 MCG/ML (10ML) SYRINGE FOR IV PUSH (FOR BLOOD PRESSURE SUPPORT)
PREFILLED_SYRINGE | INTRAVENOUS | Status: DC | PRN
  Administered 2017-05-06: 80 ug via INTRAVENOUS

## 2017-05-06 MED ORDER — POVIDONE-IODINE 10 % EX SWAB: 2.0000 "application " | Freq: Once | CUTANEOUS | Status: DC

## 2017-05-06 MED ORDER — CEFAZOLIN SODIUM-DEXTROSE 2-4 GM/100ML-% IV SOLN
2.0000 g | INTRAVENOUS | Status: AC
  Administered 2017-05-06: 2 g via INTRAVENOUS
  Filled 2017-05-06: qty 100

## 2017-05-06 MED ORDER — MENTHOL 3 MG MT LOZG: 1.0000 | LOZENGE | OROMUCOSAL | Status: DC | PRN

## 2017-05-06 MED ORDER — FENTANYL CITRATE (PF) 100 MCG/2ML IJ SOLN
INTRAMUSCULAR | Status: AC
  Administered 2017-05-06: 50 ug via INTRAVENOUS
  Filled 2017-05-06: qty 2

## 2017-05-06 MED ORDER — POLYETHYLENE GLYCOL 3350 17 G PO PACK: 17.0000 g | PACK | Freq: Every day | ORAL | Status: DC | PRN

## 2017-05-06 MED ORDER — METOCLOPRAMIDE HCL 5 MG PO TABS: 5.0000 mg | ORAL_TABLET | Freq: Three times a day (TID) | ORAL | Status: DC | PRN

## 2017-05-06 MED ORDER — PROPOFOL 500 MG/50ML IV EMUL
INTRAVENOUS | Status: DC | PRN
  Administered 2017-05-06: 50 ug/kg/min via INTRAVENOUS

## 2017-05-06 MED ORDER — PROMETHAZINE HCL 25 MG/ML IJ SOLN: 6.2500 mg | INTRAMUSCULAR | Status: DC | PRN

## 2017-05-06 MED ORDER — CEFAZOLIN SODIUM-DEXTROSE 1-4 GM/50ML-% IV SOLN
1.0000 g | Freq: Three times a day (TID) | INTRAVENOUS | Status: AC
  Administered 2017-05-06 – 2017-05-07 (×2): 1 g via INTRAVENOUS
  Filled 2017-05-06 (×2): qty 50

## 2017-05-06 MED ORDER — LIDOCAINE 2% (20 MG/ML) 5 ML SYRINGE
INTRAMUSCULAR | Status: DC | PRN
  Administered 2017-05-06 (×2): 50 mg via INTRAVENOUS

## 2017-05-06 MED ORDER — DOCUSATE SODIUM 100 MG PO CAPS
100.0000 mg | ORAL_CAPSULE | Freq: Two times a day (BID) | ORAL | Status: DC
  Administered 2017-05-06 – 2017-05-08 (×4): 100 mg via ORAL
  Filled 2017-05-06 (×3): qty 1

## 2017-05-06 MED ORDER — CHLORHEXIDINE GLUCONATE 4 % EX LIQD: 60.0000 mL | Freq: Once | CUTANEOUS | Status: DC

## 2017-05-06 MED ORDER — DEXAMETHASONE SODIUM PHOSPHATE 10 MG/ML IJ SOLN
INTRAMUSCULAR | Status: DC | PRN
  Administered 2017-05-06: 10 mg via INTRAVENOUS

## 2017-05-06 MED ORDER — 0.9 % SODIUM CHLORIDE (POUR BTL) OPTIME
TOPICAL | Status: DC | PRN
  Administered 2017-05-06: 1000 mL

## 2017-05-06 MED ORDER — ACETAMINOPHEN 325 MG PO TABS
325.0000 mg | ORAL_TABLET | Freq: Four times a day (QID) | ORAL | Status: DC | PRN
  Administered 2017-05-06 – 2017-05-07 (×3): 650 mg via ORAL
  Filled 2017-05-06 (×3): qty 2

## 2017-05-06 MED ORDER — METOCLOPRAMIDE HCL 5 MG/ML IJ SOLN: 5.0000 mg | Freq: Three times a day (TID) | INTRAMUSCULAR | Status: DC | PRN

## 2017-05-06 MED ORDER — SODIUM CHLORIDE 0.9 % IV SOLN
1000.0000 mg | INTRAVENOUS | Status: AC
  Administered 2017-05-06: 1000 mg via INTRAVENOUS
  Filled 2017-05-06: qty 10

## 2017-05-06 MED ORDER — OXYCODONE HCL 5 MG PO TABS
5.0000 mg | ORAL_TABLET | Freq: Four times a day (QID) | ORAL | Status: DC | PRN
  Administered 2017-05-06 – 2017-05-07 (×3): 5 mg via ORAL
  Filled 2017-05-06 (×3): qty 1

## 2017-05-06 MED ORDER — HYDROMORPHONE HCL 2 MG/ML IJ SOLN: 0.2500 mg | INTRAMUSCULAR | Status: DC | PRN

## 2017-05-06 MED ORDER — PROPOFOL 10 MG/ML IV BOLUS
INTRAVENOUS | Status: DC | PRN
  Administered 2017-05-06: 20 mg via INTRAVENOUS
  Administered 2017-05-06: 40 mg via INTRAVENOUS
  Administered 2017-05-06: 30 mg via INTRAVENOUS

## 2017-05-06 MED ORDER — ASPIRIN EC 325 MG PO TBEC
325.0000 mg | DELAYED_RELEASE_TABLET | Freq: Every day | ORAL | Status: DC
  Administered 2017-05-07 – 2017-05-08 (×2): 325 mg via ORAL
  Filled 2017-05-06 (×2): qty 1

## 2017-05-06 MED ORDER — FENTANYL CITRATE (PF) 100 MCG/2ML IJ SOLN
100.0000 ug | Freq: Once | INTRAMUSCULAR | Status: AC
  Administered 2017-05-06: 25 ug via INTRAVENOUS

## 2017-05-06 MED ORDER — ONDANSETRON HCL 4 MG/2ML IJ SOLN
INTRAMUSCULAR | Status: DC | PRN
  Administered 2017-05-06: 4 mg via INTRAVENOUS

## 2017-05-06 MED ORDER — PHENYLEPHRINE HCL 10 MG/ML IJ SOLN
INTRAVENOUS | Status: DC | PRN
  Administered 2017-05-06: 20 ug/min via INTRAVENOUS

## 2017-05-06 MED ORDER — LACTATED RINGERS IV SOLN
INTRAVENOUS | Status: DC
  Administered 2017-05-06 (×2): via INTRAVENOUS

## 2017-05-06 SURGICAL SUPPLY — 88 items
ADH SKN CLS APL DERMABOND .7 (GAUZE/BANDAGES/DRESSINGS) ×2
APL SKNCLS STERI-STRIP NONHPOA (GAUZE/BANDAGES/DRESSINGS)
BENZOIN TINCTURE PRP APPL 2/3 (GAUZE/BANDAGES/DRESSINGS) ×1 IMPLANT
BLADE CLIPPER SURG (BLADE) IMPLANT
BLADE SAW SGTL 73X25 THK (BLADE) ×2 IMPLANT
BRUSH FEMORAL CANAL (MISCELLANEOUS) IMPLANT
CAPT HIP TOTAL 2 ×1 IMPLANT
COVER BACK TABLE 24X17X13 BIG (DRAPES) ×1 IMPLANT
COVER SURGICAL LIGHT HANDLE (MISCELLANEOUS) ×1 IMPLANT
DERMABOND ADVANCED (GAUZE/BANDAGES/DRESSINGS) ×2
DERMABOND ADVANCED .7 DNX12 (GAUZE/BANDAGES/DRESSINGS) IMPLANT
DRAPE C-ARM 42X72 X-RAY (DRAPES) ×1 IMPLANT
DRAPE IMP U-DRAPE 54X76 (DRAPES) ×2 IMPLANT
DRAPE INCISE IOBAN 66X45 STRL (DRAPES) ×1 IMPLANT
DRAPE ORTHO SPLIT 77X108 STRL (DRAPES) ×4
DRAPE SURG ORHT 6 SPLT 77X108 (DRAPES) ×2 IMPLANT
DRAPE U-SHAPE 47X51 STRL (DRAPES) ×3 IMPLANT
DRILL BIT 7/64X5 (BIT) ×1 IMPLANT
DRSG AQUACEL AG ADV 3.5X10 (GAUZE/BANDAGES/DRESSINGS) ×1 IMPLANT
DRSG PAD ABDOMINAL 8X10 ST (GAUZE/BANDAGES/DRESSINGS) ×2 IMPLANT
DURAPREP 26ML APPLICATOR (WOUND CARE) ×2 IMPLANT
ELECT BLADE 4.0 EZ CLEAN MEGAD (MISCELLANEOUS) ×2
ELECT CAUTERY BLADE 6.4 (BLADE) ×2 IMPLANT
ELECT REM PT RETURN 9FT ADLT (ELECTROSURGICAL) ×2
ELECTRODE BLDE 4.0 EZ CLN MEGD (MISCELLANEOUS) IMPLANT
ELECTRODE REM PT RTRN 9FT ADLT (ELECTROSURGICAL) ×1 IMPLANT
EVACUATOR 1/8 PVC DRAIN (DRAIN) IMPLANT
FACESHIELD WRAPAROUND (MASK) ×6 IMPLANT
FACESHIELD WRAPAROUND OR TEAM (MASK) ×2 IMPLANT
GAUZE SPONGE 4X4 12PLY STRL (GAUZE/BANDAGES/DRESSINGS) ×1 IMPLANT
GAUZE XEROFORM 5X9 LF (GAUZE/BANDAGES/DRESSINGS) ×1 IMPLANT
GLOVE BIO SURGEON STRL SZ 6.5 (GLOVE) ×1 IMPLANT
GLOVE BIOGEL PI IND STRL 6.5 (GLOVE) IMPLANT
GLOVE BIOGEL PI IND STRL 7.0 (GLOVE) IMPLANT
GLOVE BIOGEL PI IND STRL 8 (GLOVE) ×2 IMPLANT
GLOVE BIOGEL PI INDICATOR 6.5 (GLOVE) ×1
GLOVE BIOGEL PI INDICATOR 7.0 (GLOVE) ×1
GLOVE BIOGEL PI INDICATOR 8 (GLOVE) ×2
GLOVE ECLIPSE 7.0 STRL STRAW (GLOVE) ×1 IMPLANT
GLOVE ORTHO TXT STRL SZ7.5 (GLOVE) ×4 IMPLANT
GOWN STRL REUS W/ TWL LRG LVL3 (GOWN DISPOSABLE) ×1 IMPLANT
GOWN STRL REUS W/ TWL XL LVL3 (GOWN DISPOSABLE) ×1 IMPLANT
GOWN STRL REUS W/TWL 2XL LVL3 (GOWN DISPOSABLE) ×2 IMPLANT
GOWN STRL REUS W/TWL LRG LVL3 (GOWN DISPOSABLE) ×6
GOWN STRL REUS W/TWL XL LVL3 (GOWN DISPOSABLE) ×2
HANDPIECE INTERPULSE COAX TIP (DISPOSABLE)
IMMOBILIZER KNEE 20 (SOFTGOODS) IMPLANT
IMMOBILIZER KNEE 22 UNIV (SOFTGOODS) IMPLANT
IMMOBILIZER KNEE 24 THIGH 36 (MISCELLANEOUS) IMPLANT
IMMOBILIZER KNEE 24 UNIV (MISCELLANEOUS) IMPLANT
KIT BASIN OR (CUSTOM PROCEDURE TRAY) ×2 IMPLANT
KIT TURNOVER KIT B (KITS) ×2 IMPLANT
MANIFOLD NEPTUNE II (INSTRUMENTS) ×2 IMPLANT
NDL 1/2 CIR MAYO (NEEDLE) ×1 IMPLANT
NDL HYPO 25GX1X1/2 BEV (NEEDLE) ×1 IMPLANT
NEEDLE 1/2 CIR MAYO (NEEDLE) ×2 IMPLANT
NEEDLE HYPO 25GX1X1/2 BEV (NEEDLE) ×2 IMPLANT
NEEDLE MAYO .5 CIRCLE (NEEDLE) ×2 IMPLANT
NS IRRIG 1000ML POUR BTL (IV SOLUTION) ×2 IMPLANT
PACK TOTAL JOINT (CUSTOM PROCEDURE TRAY) ×2 IMPLANT
PACK UNIVERSAL I (CUSTOM PROCEDURE TRAY) ×1 IMPLANT
PAD ARMBOARD 7.5X6 YLW CONV (MISCELLANEOUS) ×4 IMPLANT
PIN STEINMAN 3/16 (PIN) ×2 IMPLANT
PRESSURIZER FEMORAL UNIV (MISCELLANEOUS) IMPLANT
SET HNDPC FAN SPRY TIP SCT (DISPOSABLE) IMPLANT
SPONGE LAP 4X18 X RAY DECT (DISPOSABLE) ×3 IMPLANT
STAPLER VISISTAT 35W (STAPLE) ×1 IMPLANT
STRIP CLOSURE SKIN 1/2X4 (GAUZE/BANDAGES/DRESSINGS) ×1 IMPLANT
SUCTION FRAZIER HANDLE 10FR (MISCELLANEOUS) ×1
SUCTION TUBE FRAZIER 10FR DISP (MISCELLANEOUS) ×1 IMPLANT
SUT ETHIBOND NAB CT1 #1 30IN (SUTURE) ×3 IMPLANT
SUT TICRON (SUTURE) ×3 IMPLANT
SUT VIC AB 0 CT1 27 (SUTURE)
SUT VIC AB 0 CT1 27XBRD ANBCTR (SUTURE) IMPLANT
SUT VIC AB 2-0 CT1 27 (SUTURE) ×4
SUT VIC AB 2-0 CT1 TAPERPNT 27 (SUTURE) ×2 IMPLANT
SUT VIC AB 3-0 SH 27 (SUTURE) ×2
SUT VIC AB 3-0 SH 27X BRD (SUTURE) IMPLANT
SUT VICRYL 0 TIES 12 18 (SUTURE) IMPLANT
SUT VICRYL 4-0 PS2 18IN ABS (SUTURE) ×1 IMPLANT
SYR CONTROL 10ML LL (SYRINGE) ×2 IMPLANT
TOWEL OR 17X24 6PK STRL BLUE (TOWEL DISPOSABLE) ×2 IMPLANT
TOWEL OR 17X26 10 PK STRL BLUE (TOWEL DISPOSABLE) ×2 IMPLANT
TOWER CARTRIDGE SMART MIX (DISPOSABLE) IMPLANT
TRAY CATH 16FR W/PLASTIC CATH (SET/KITS/TRAYS/PACK) ×1 IMPLANT
TRAY FOLEY W/METER SILVER 16FR (SET/KITS/TRAYS/PACK) IMPLANT
WATER STERILE IRR 1000ML POUR (IV SOLUTION) ×5 IMPLANT
YANKAUER SUCT BULB TIP NO VENT (SUCTIONS) ×2 IMPLANT

## 2017-05-06 NOTE — Op Note (Signed)
Preop diagnosis: Right displaced femoral neck fracture.  Postop diagnosis: Same  Procedure: Right direct anterior total hip arthroplasty.  Surgeon: Rodell Perna, MD  Assistant: Benjiman Core, PA-C medically necessary and present for the entire procedure  Anesthesia spinal  Implants: Depuy grypton   9mm cup, +4 neutral liner poly.  #12 corail stem +1.5 mm cobalt chrome ball.  Procedure after induction of spinal anesthesia placement on the Hana table careful positioning of the boot and the patient C-arm was brought in for visualization of both hips.  C arm was then backed up sterilely draped.  2   1015 drapes were applied followed by DuraPrep large shower curtain Steri-Drape half sheet above have sheet across.  Hydraulic mechanism was prepared cutting a hole in the sheet and then draping the base fit with the piece of Betadine Steri-Drape ceiling it securely.  A timeout procedure was completed.  Skin line had been drawn after prepping and before the large shower curtain drape was applied.  Preoperative antibiotics were given timeout procedure was completed.  Direct anterior incision was made starting 2 cm lateral to centimeters inferior to the ASIS obliquely to the tip of the trochanter slightly anterior.  Fascia was identified after skin protector was placed neck extended and then transverse arteries were coagulated.Homan was placed across over the top of the capsule capsule was opened fracture was identified neck was cut 1 fingerbreadth above the lesser trochanter and a small spike was left medially had come back and trim this.  Labrum was excised sequential reaming up to 49 450 mm cup that was placed impacted under C arm visualization securely with medialization down to the base of the fovea.  Palate was popped and cup was secure excellent tight fit no screw was needed.  Cup that was used was a 3 hole down But no screw was needed.  Cup was secure.  Posterior capsule was divided.  Hydraulic clamp was  carefully placed leg was taken down and under and then preparation the trochanter placement of the large trochanteric clamp and Mueller retractor medially over the lesser trochanter.  Preparation on the canal with box cutter rondure Curette, chili pepper, sequential broaching up to 12 checking under C arm AP and lateral of the femur and then trialing.  1.5 mm neck length restored the neck length identical to the opposite hip.  Permanent prosthesis was inserted.  Mueller caused a small broach through the posterior cortex and some extra bone was packed next of the prosthesis just before it was impacted the last centimeter and one half down into the canal.  Stem was secure.  Hip was reduced identical findings of good stability.  External rotation 90 degrees good stability.  Extension to 45 degrees locking and then external rotation at 90 degrees with good stability.  Neutral position and lateral fluoroscopic x-ray of the hip was taken confirming good fit and fill of the canal good position of the prosthesis directly down the middle of the canal.  Leg lengths were equal.  Irrigation copiously.  The lot closure of the deep fascia 0 Vicryl subtenons tissue to well in the subcutaneous tissue and 3-0 subcuticular closure Dermabond in the skin Aquacel dressing and transferred to recovery room in stable condition.

## 2017-05-06 NOTE — Anesthesia Procedure Notes (Signed)
Spinal  Patient location during procedure: OR Staffing Anesthesiologist: Nolon Nations, MD Performed: anesthesiologist  Preanesthetic Checklist Completed: patient identified, site marked, surgical consent, pre-op evaluation, timeout performed, IV checked, risks and benefits discussed and monitors and equipment checked Spinal Block Patient position: left lateral decubitus Prep: ChloraPrep and site prepped and draped Patient monitoring: heart rate, continuous pulse ox and blood pressure Approach: right paramedian Location: L2-3 Injection technique: single-shot Needle Needle type: Sprotte  Needle gauge: 24 G Needle length: 9 cm Assessment Sensory level: T8 Additional Notes Expiration date of kit checked and confirmed. Patient tolerated procedure well, without complications.

## 2017-05-06 NOTE — Progress Notes (Signed)
PROGRESS NOTE    Angela [name]  LKG:[id number] DOB: 1940/11/28 DOA: 05/05/2017 PCP: Sigmund Hazel, MD   Brief Narrative:  77 y.o. female who denies any significant past medical history, typically enjoying good health, and now presenting to the ED with severe left hip pain after a fall at home with imaging findings c/w L hip fracture.   Assessment & Plan:   Principal Problem:   Closed left hip fracture, initial encounter (HCC)  1. Left hip fracture  - Presents with severe left hip pain after a mechanical fall at home  - Radiographs reveal left femoral neck fracture with displacement  - Orthopedic surgery is consulting and much appreciated.  Plan for OR today.  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  Planning for surgery.    - Keep NPO after midnight, provide IVF hydration, continue prn analgesia and supportive care    2. Hypoxia: Suspect this is 2/2 narcotics as pt noted to need O2 after being started on pain medications in ED, continue for now, wean as tolerated.  Will work up further if persistent.    DVT prophylaxis: SCD's, will need DVT ppx post op (looks like planning for ASA) Code Status: full  Family Communication: daughter at bedside Disposition Plan: pending surgery and likely SNF placement   Consultants:   orthopedics  Procedures:   none  Antimicrobials: Anti-infectives (From admission, onward)   Start     Dose/Rate Route Frequency Ordered Stop   05/06/17 1430  ceFAZolin (ANCEF) IVPB 2g/100 mL premix     2 g 200 mL/hr over 30 Minutes Intravenous On call to O.R. 05/06/17 0213 05/07/17 0559      Subjective: C/o L hip pain.  Last dose of pain medication beginning to wear off.  No CP or SOB.   Objective: Vitals:   05/05/17 2330 05/06/17 0030 05/06/17 0116 05/06/17 0608  BP: (!) 108/47 (!) 133/56 (!) 145/60 99/63  Pulse: 62 61 64 62  Resp: 13 18 16 16   Temp:   97.9 F (36.6 C) 98 F (36.7 C)  TempSrc:   Oral Oral  SpO2: 96% 100% 100% 98%  Weight:      Height:         Intake/Output Summary (Last 24 hours) at 05/06/2017 1318 Last data filed at 05/06/2017 0900 Gross per 24 hour  Intake 0 ml  Output -  Net 0 ml   Filed Weights   05/05/17 1752  Weight: 62.6 kg (138 lb)    Examination:  General exam: Appears calm and comfortable  Respiratory system: Clear to auscultation. Respiratory effort normal. Cardiovascular system: S1 & S2 heard, RRR. No JVD, murmurs, rubs, gallops or clicks. No pedal edema. Gastrointestinal system: Abdomen is nondistended, soft and nontender. No organomegaly or masses felt. Normal bowel sounds heard. Central nervous system: Alert and oriented. No focal neurological deficits. Extremities: Symmetric 5 x 5 power. Skin: No rashes, lesions or ulcers Psychiatry: Judgement and insight appear normal. Mood & affect appropriate.     Data Reviewed: I have personally reviewed following labs and imaging studies  CBC: Recent Labs  Lab 05/05/17 1830 05/06/17 0942  WBC 7.5 9.5  HGB 13.7 13.2  HCT 41.1 39.8  MCV 88.0 89.0  PLT 214 196   Basic Metabolic Panel: Recent Labs  Lab 05/05/17 1830 05/06/17 0942  NA 133* 135  K 4.1 3.9  CL 99* 103  CO2 21* 24  GLUCOSE 95 103*  BUN 19 16  CREATININE 0.79 0.67  CALCIUM 9.5 8.7*  GFR: Estimated Creatinine Clearance: 56.9 mL/min (by C-G formula based on SCr of 0.67 mg/dL). Liver Function Tests: No results for input(s): AST, ALT, ALKPHOS, BILITOT, PROT, ALBUMIN in the last 168 hours. No results for input(s): LIPASE, AMYLASE in the last 168 hours. No results for input(s): AMMONIA in the last 168 hours. Coagulation Profile: Recent Labs  Lab 05/06/17 0942  INR 0.99   Cardiac Enzymes: No results for input(s): CKTOTAL, CKMB, CKMBINDEX, TROPONINI in the last 168 hours. BNP (last 3 results) No results for input(s): PROBNP in the last 8760 hours. HbA1C: No results for input(s): HGBA1C in the last 72 hours. CBG: No results for input(s): GLUCAP in the last 168 hours. Lipid  Profile: No results for input(s): CHOL, HDL, LDLCALC, TRIG, CHOLHDL, LDLDIRECT in the last 72 hours. Thyroid Function Tests: No results for input(s): TSH, T4TOTAL, FREET4, T3FREE, THYROIDAB in the last 72 hours. Anemia Panel: No results for input(s): VITAMINB12, FOLATE, FERRITIN, TIBC, IRON, RETICCTPCT in the last 72 hours. Sepsis Labs: No results for input(s): PROCALCITON, LATICACIDVEN in the last 168 hours.  Recent Results (from the past 240 hour(s))  Surgical PCR screen     Status: None   Collection Time: 05/06/17  8:33 AM  Result Value Ref Range Status   MRSA, PCR NEGATIVE NEGATIVE Final   Staphylococcus aureus NEGATIVE NEGATIVE Final    Comment: (NOTE) The Xpert SA Assay (FDA approved for NASAL specimens in patients 4 years of age and older), is one component of a comprehensive surveillance program. It is not intended to diagnose infection nor to guide or monitor treatment. Performed at [facility], [address], [place], [place] [address]          Radiology Studies: Dg Chest 1 View  Result Date: 05/05/2017 CLINICAL DATA:  Fall.  Hip pain. EXAM: CHEST  1 VIEW COMPARISON:  None. FINDINGS: The heart size and mediastinal contours are within normal limits. Both lungs are clear. The visualized skeletal structures are unremarkable. IMPRESSION: No active disease. Electronically Signed   By: Gerome Sam III M.D   On: 05/05/2017 19:10   Dg Hip Unilat With Pelvis 2-3 Views Left  Result Date: 05/05/2017 CLINICAL DATA:  Pain after fall. EXAM: DG HIP (WITH OR WITHOUT PELVIS) 2-3V LEFT COMPARISON:  None. FINDINGS: There is a fracture through the left femoral neck with moderate displacement. No dislocation of the femoral head. No other fractures identified. IMPRESSION: Left femoral neck fracture with displacement. Electronically Signed   By: Gerome Sam III M.D   On: 05/05/2017 19:10        Scheduled Meds: . chlorhexidine  60 mL Topical Once  . povidone-iodine  2  application Topical Once   Continuous Infusions: .  ceFAZolin (ANCEF) IV    . methocarbamol (ROBAXIN)  IV       LOS: 1 day    Time spent: over 30 min    Lacretia Nicks, MD [facility] Pager [phone]  If 7PM-7AM, please contact night-coverage [email or web address] Password [email or web address] 05/06/2017, 1:18 PM

## 2017-05-06 NOTE — Progress Notes (Signed)
Orthopedic Tech Progress Note Patient Details:  Angela [name] [birth year] [id number]  Patient ID: [name], female   DOB: 1940/03/25, 77 y.o.   MRN: [id number] Pt cant have ohf due to age restrictions.  Angela [name] 05/06/2017, 11:17 PM

## 2017-05-06 NOTE — Transfer of Care (Signed)
Immediate Anesthesia Transfer of Care Note  Patient: Angela [name]  Procedure(s) Performed: TOTAL ANTERIOR HIP ARTHROPLASTY -LEFT (Left Hip)  Patient Location: PACU  Anesthesia Type:Spinal  Level of Consciousness: awake and alert   Airway & Oxygen Therapy: Patient Spontanous Breathing  Post-op Assessment: Report given to RN  Post vital signs: Reviewed and stable  Last Vitals:  Vitals Value Taken Time  BP 139/116 05/06/2017  5:26 PM  Temp    Pulse 58 05/06/2017  5:27 PM  Resp 11 05/06/2017  5:27 PM  SpO2 91 % 05/06/2017  5:27 PM  Vitals shown include unvalidated device data.  Last Pain:  Vitals:   05/06/17 1344  TempSrc: Oral  PainSc:          Complications: No apparent anesthesia complications

## 2017-05-06 NOTE — Interval H&P Note (Signed)
History and Physical Interval Note:  05/06/2017 2:32 PM  Angela [name]  has presented today for surgery, with the diagnosis of left femoral neck fracture  The various methods of treatment have been discussed with the patient and family. After consideration of risks, benefits and other options for treatment, the patient has consented to  Procedure(s): TOTAL HIP ARTHROPLASTY (Left) as a surgical intervention .  The patient's history has been reviewed, patient examined, no change in status, stable for surgery.  I have reviewed the patient's chart and labs.  Questions were answered to the patient's satisfaction.     Marybelle Killings

## 2017-05-06 NOTE — H&P (View-Only) (Signed)
Reason for Consult:fall with closed displaced left femoral neck fracture Referring Physician: Cephus Slater., [facility]     MD  Angela [name] is an 77 y.o. female.  HPI: 77 year old female fell on her back deck with inability to walk with sharp extreme left hip pain after the fall.  She has a history of osteoporosis otherwise has been healthy and takes multivitamins.  No history of falls other than the one that caused her admission.  Patient's community ambulator active has had previous carpal tunnel release on the right.  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  Denies loss of consciousness or dizziness.  Pain is limited to the left hip.  Past Medical History:  Diagnosis Date  . Carpal tunnel syndrome on right 10/08  . Elevated cholesterol 5/93  . Nephrolithiasis 2010  . Osteoporosis   . 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 BLEPHAROPLASTY Bilateral 2/14   Dr. Isidoro Donning  . CARPAL TUNNEL RELEASE Right 6/08  . CERVICAL DISCECTOMY  12/91  . KNEE SURGERY Left   . spots removed     frozen and biopsied-all benign  . TONSILLECTOMY AND ADENOIDECTOMY      Family History  Problem Relation Age of Onset  . Hypertension Mother   . Thyroid disease Mother   . Stroke Father     Social History:  reports that she has never smoked. She has never used smokeless tobacco. She reports that she drinks about 0.6 oz of alcohol per week. She reports that she does not use drugs.  Allergies:  Allergies  Allergen Reactions  . Statins Nausea Only    Medications: I have reviewed the patient's current medications.  Results for orders placed or performed during the hospital encounter of 05/05/17 (from the past 48 hour(s))  Basic metabolic panel     Status: Abnormal   Collection Time: 05/05/17  6:30 PM  Result Value Ref Range   Sodium 133 (L) 135 - 145 mmol/L   Potassium 4.1 3.5 - 5.1 mmol/L   Chloride 99 (L) 101 - 111  mmol/L   CO2 21 (L) 22 - 32 mmol/L   Glucose, Bld 95 65 - 99 mg/dL   BUN 19 6 - 20 mg/dL   Creatinine, Ser 0.79 0.44 - 1.00 mg/dL   Calcium 9.5 8.9 - 10.3 mg/dL   GFR calc non Af Amer >60 >60 mL/min   GFR calc Af Amer >60 >60 mL/min    Comment: (NOTE) The eGFR has been calculated using the CKD EPI equation. This calculation has not been validated in all clinical situations. eGFR's persistently <60 mL/min signify possible Chronic Kidney Disease.    Anion gap 13 5 - 15    Comment: Performed at [facility] [address] [place] [address]  CBC     Status: None   Collection Time: 05/05/17  6:30 PM  Result Value Ref Range   WBC 7.5 4.0 - 10.5 K/uL   RBC 4.67 3.87 - 5.11 MIL/uL   Hemoglobin 13.7 12.0 - 15.0 g/dL   HCT 41.1 36.0 - 46.0 %   MCV 88.0 78.0 - 100.0 fL   MCH 29.3 26.0 - 34.0 pg   MCHC 33.3 30.0 - 36.0 g/dL   RDW 14.3 11.5 - 15.5 %   Platelets 214 150 - 400 K/uL    Comment: Performed at [facility], [place] [address], [place], [place] [address]  Dg Chest 1 View  Result Date: 05/05/2017 CLINICAL DATA:  Fall.  Hip pain. EXAM: CHEST  1 VIEW COMPARISON:  None. FINDINGS: The heart size and mediastinal contours are within normal limits. Both lungs are clear. The visualized skeletal structures are unremarkable. IMPRESSION: No active disease. Electronically Signed   By: Dorise Bullion III M.D   On: 05/05/2017 19:10   Dg Hip Unilat With Pelvis 2-3 Views Left  Result Date: 05/05/2017 CLINICAL DATA:  Pain after fall. EXAM: DG HIP (WITH OR WITHOUT PELVIS) 2-3V LEFT COMPARISON:  None. FINDINGS: There is a fracture through the left femoral neck with moderate displacement. No dislocation of the femoral head. No other fractures identified. IMPRESSION: Left femoral neck fracture with displacement. Electronically Signed   By: Dorise Bullion III M.D   On: 05/05/2017 19:10    Review of Systems  Constitutional: Negative.   HENT:       Glasses for vision previous   blepharoplasty  Eyes:       Patient wears glasses.  Respiratory: Negative.   Cardiovascular: Negative.   Gastrointestinal: Negative.   Genitourinary: Negative.   Musculoskeletal:       Patient self reports a diagnosis of polymyalgia.  She has osteoporosis.  Skin: Negative.   Neurological: Negative.   Endo/Heme/Allergies: Negative.   Psychiatric/Behavioral: Negative.    Blood pressure 99/63, pulse 62, temperature 98 F (36.7 C), temperature source Oral, resp. rate 16, height 5' 6" (1.676 m), weight 138 lb (62.6 kg), last menstrual period 01/13/1993, SpO2 98 %. Physical Exam  Constitutional: She is oriented to person, place, and time. She appears well-developed and well-nourished.  HENT:  Head: Normocephalic.  Eyes: Pupils are equal, round, and reactive to light.  Neck: Normal range of motion. Neck supple. No tracheal deviation present. No thyromegaly present.  Cardiovascular: Normal rate and regular rhythm.  Respiratory: Effort normal and breath sounds normal. She has no wheezes.  GI: Soft. She exhibits no distension.  Musculoskeletal:  Distal pulses are intact.  Left lower extremity shortened and externally rotated.  No pain with right hip range of motion.  Neurological: She is alert and oriented to person, place, and time.  Skin: Skin is warm.  Psychiatric: She has a normal mood and affect. Her behavior is normal. Judgment and thought content normal.    Assessment/Plan: Patient's husband at the bedside and procedure discussed with patient and her husband.  Patient has a displaced femoral neck fracture.  Plan left total hip arthroplasty for early mobilization.  Plan postop aspirin with negative history for DVT.  She should be able to go home in 2 to 3 days.  Plan procedure discussed including potential for leg length inequality.  Risk of dislocation, loosening, revision surgery, femur fracture discussed.  Patient understands and requests we proceed.  Angela [name] 05/06/2017, 8:36  AM

## 2017-05-06 NOTE — Progress Notes (Signed)
Initial Nutrition Assessment  DOCUMENTATION CODES:   Not applicable  INTERVENTION:    Advance diet as medically appropriate   RD to add supplements when/as able  NUTRITION DIAGNOSIS:   Increased nutrient needs related to post-op healing as evidenced by estimated needs  GOAL:   Patient will meet greater than or equal to 90% of their needs  MONITOR:   PO intake, Supplement acceptance, Labs, Skin, Weight trends, I & O's  REASON FOR ASSESSMENT:   Consult Hip fracture protocol, Assessment of nutrition requirement/status  ASSESSMENT:   77 y.o. Female who denies any significant past medical history, typically enjoying good health, and now presenting to the ED with severe left hip pain after a fall at home.   RD spoke with pt at bedside. Daughter present. Reports a good appetite PTA. Was following a "low carb" diet. Reveals she's lost about 7 lbs within a year given diet modifications.   Pt has never tried oral nutrition supplements, however, agreeable post-op. Labs and medications reviewed. NPO for surgery today.  NUTRITION - FOCUSED PHYSICAL EXAM:  Completed. No muscle or fat depletion noticed.  Diet Order:  Diet NPO time specified Except for: Ice Chips Diet NPO time specified Except for: Sips with Meds  EDUCATION NEEDS:   No education needs have been identified at this time  Skin:  Skin Assessment: Reviewed RN Assessment  Last BM:  N/A   Height:   Ht Readings from Last 1 Encounters:  05/05/17 5\' 6"  (1.676 m)   Weight:   Wt Readings from Last 1 Encounters:  05/05/17 138 lb (62.6 kg)   Ideal Body Weight:  59 kg  BMI:  Body mass index is 22.27 kg/m.  Estimated Nutritional Needs:   Kcal:  1700-1900  Protein:  80-95 gm  Fluid:  1.7-1.9 L  Arthur Holms, RD, LDN Pager #: [phone] After-Hours Pager #: [phone]

## 2017-05-06 NOTE — Anesthesia Preprocedure Evaluation (Addendum)
Anesthesia Evaluation  Patient identified by MRN, date of birth, ID band Patient awake    Reviewed: Allergy & Precautions, NPO status , Patient's Chart, lab work & pertinent test results  Airway Mallampati: II  TM Distance: >3 FB Neck ROM: Full    Dental no notable dental hx.    Pulmonary neg pulmonary ROS,    Pulmonary exam normal breath sounds clear to auscultation       Cardiovascular negative cardio ROS Normal cardiovascular exam Rhythm:Regular Rate:Normal     Neuro/Psych  Neuromuscular disease negative psych ROS   GI/Hepatic negative GI ROS, Neg liver ROS,   Endo/Other  negative endocrine ROS  Renal/GU Renal disease     Musculoskeletal negative musculoskeletal ROS (+)   Abdominal   Peds  Hematology negative hematology ROS (+)   Anesthesia Other Findings   Reproductive/Obstetrics negative OB ROS                            Anesthesia Physical Anesthesia Plan  ASA: II  Anesthesia Plan: Spinal   Post-op Pain Management:    Induction: Intravenous  PONV Risk Score and Plan: 3 and Ondansetron, Dexamethasone, Treatment may vary due to age or medical condition and Propofol infusion  Airway Management Planned:   Additional Equipment:   Intra-op Plan:   Post-operative Plan:   Informed Consent: I have reviewed the patients History and Physical, chart, labs and discussed the procedure including the risks, benefits and alternatives for the proposed anesthesia with the patient or authorized representative who has indicated his/her understanding and acceptance.   Dental advisory given  Plan Discussed with: CRNA  Anesthesia Plan Comments:        Anesthesia Quick Evaluation

## 2017-05-06 NOTE — ED Notes (Signed)
Report attempted x3. Nurse on floor to call back in five minutes.

## 2017-05-06 NOTE — ED Notes (Signed)
Report attempted x2

## 2017-05-06 NOTE — Anesthesia Postprocedure Evaluation (Signed)
Anesthesia Post Note  Patient: Angela [name]  Procedure(s) Performed: TOTAL ANTERIOR HIP ARTHROPLASTY -LEFT (Left Hip)     Patient location during evaluation: PACU Anesthesia Type: Spinal Level of consciousness: awake and alert Pain management: pain level controlled Vital Signs Assessment: post-procedure vital signs reviewed and stable Respiratory status: spontaneous breathing and respiratory function stable Cardiovascular status: blood pressure returned to baseline and stable Postop Assessment: spinal receding Anesthetic complications: no    Last Vitals:  Vitals:   05/06/17 1827 05/06/17 1841  BP: (!) 114/59   Pulse: 68   Resp: 14   Temp:  36.6 C  SpO2: 98%     Last Pain:  Vitals:   05/06/17 1841  TempSrc:   PainSc: 0-No pain                 Nolon Nations

## 2017-05-06 NOTE — ED Notes (Signed)
Report attempted x 1

## 2017-05-06 NOTE — Consult Note (Signed)
 Reason for Consult:fall with closed displaced left femoral neck fracture Referring Physician: Powell Jr., AC     MD  Angela [name] is an 76 y.o. female.  HPI: 76-year-old female fell on her back deck with inability to walk with sharp extreme left hip pain after the fall.  She has a history of osteoporosis otherwise has been healthy and takes multivitamins.  No history of falls other than the one that caused her admission.  Patient's community ambulator active has had previous carpal tunnel release on the right.  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  Denies loss of consciousness or dizziness.  Pain is limited to the left hip.  Past Medical History:  Diagnosis Date  . Carpal tunnel syndrome on right 10/08  . Elevated cholesterol 5/93  . Nephrolithiasis 2010  . Osteoporosis   . Polymyalgia (HCC) 2/97    Past Surgical History:  Procedure Laterality Date  . BLEPHAROPLASTY Bilateral 2/14   Dr. Renzo Zaldivar  . CARPAL TUNNEL RELEASE Right 6/08  . CERVICAL DISCECTOMY  12/91  . KNEE SURGERY Left   . spots removed     frozen and biopsied-all benign  . TONSILLECTOMY AND ADENOIDECTOMY      Family History  Problem Relation Age of Onset  . Hypertension Mother   . Thyroid disease Mother   . Stroke Father     Social History:  reports that she has never smoked. She has never used smokeless tobacco. She reports that she drinks about 0.6 oz of alcohol per week. She reports that she does not use drugs.  Allergies:  Allergies  Allergen Reactions  . Statins Nausea Only    Medications: I have reviewed the patient's current medications.  Results for orders placed or performed during the hospital encounter of 05/05/17 (from the past 48 hour(s))  Basic metabolic panel     Status: Abnormal   Collection Time: 05/05/17  6:30 PM  Result Value Ref Range   Sodium 133 (L) 135 - 145 mmol/L   Potassium 4.1 3.5 - 5.1 mmol/L   Chloride 99 (L) 101 - 111  mmol/L   CO2 21 (L) 22 - 32 mmol/L   Glucose, Bld 95 65 - 99 mg/dL   BUN 19 6 - 20 mg/dL   Creatinine, Ser 0.79 0.44 - 1.00 mg/dL   Calcium 9.5 8.9 - 10.3 mg/dL   GFR calc non Af Amer >60 >60 mL/min   GFR calc Af Amer >60 >60 mL/min    Comment: (NOTE) The eGFR has been calculated using the CKD EPI equation. This calculation has not been validated in all clinical situations. eGFR's persistently <60 mL/min signify possible Chronic Kidney Disease.    Anion gap 13 5 - 15    Comment: Performed at [facility], [address] [place], [place] [address]  CBC     Status: None   Collection Time: 05/05/17  6:30 PM  Result Value Ref Range   WBC 7.5 4.0 - 10.5 K/uL   RBC 4.67 3.87 - 5.11 MIL/uL   Hemoglobin 13.7 12.0 - 15.0 g/dL   HCT 41.1 36.0 - 46.0 %   MCV 88.0 78.0 - 100.0 fL   MCH 29.3 26.0 - 34.0 pg   MCHC 33.3 30.0 - 36.0 g/dL   RDW 14.3 11.5 - 15.5 %   Platelets 214 150 - 400 K/uL    Comment: Performed at [facility], [address] [place], [place] [address]      Dg Chest 1 View  Result Date: 05/05/2017 CLINICAL DATA:  Fall.  Hip pain. EXAM: CHEST  1 VIEW COMPARISON:  None. FINDINGS: The heart size and mediastinal contours are within normal limits. Both lungs are clear. The visualized skeletal structures are unremarkable. IMPRESSION: No active disease. Electronically Signed   By: David  Williams III M.D   On: 05/05/2017 19:10   Dg Hip Unilat With Pelvis 2-3 Views Left  Result Date: 05/05/2017 CLINICAL DATA:  Pain after fall. EXAM: DG HIP (WITH OR WITHOUT PELVIS) 2-3V LEFT COMPARISON:  None. FINDINGS: There is a fracture through the left femoral neck with moderate displacement. No dislocation of the femoral head. No other fractures identified. IMPRESSION: Left femoral neck fracture with displacement. Electronically Signed   By: David  Williams III M.D   On: 05/05/2017 19:10    Review of Systems  Constitutional: Negative.   HENT:       Glasses for vision previous   blepharoplasty  Eyes:       Patient wears glasses.  Respiratory: Negative.   Cardiovascular: Negative.   Gastrointestinal: Negative.   Genitourinary: Negative.   Musculoskeletal:       Patient self reports a diagnosis of polymyalgia.  She has osteoporosis.  Skin: Negative.   Neurological: Negative.   Endo/Heme/Allergies: Negative.   Psychiatric/Behavioral: Negative.    Blood pressure 99/63, pulse 62, temperature 98 F (36.7 C), temperature source Oral, resp. rate 16, height 5' 6" (1.676 m), weight 138 lb (62.6 kg), last menstrual period 01/13/1993, SpO2 98 %. Physical Exam  Constitutional: She is oriented to person, place, and time. She appears well-developed and well-nourished.  HENT:  Head: Normocephalic.  Eyes: Pupils are equal, round, and reactive to light.  Neck: Normal range of motion. Neck supple. No tracheal deviation present. No thyromegaly present.  Cardiovascular: Normal rate and regular rhythm.  Respiratory: Effort normal and breath sounds normal. She has no wheezes.  GI: Soft. She exhibits no distension.  Musculoskeletal:  Distal pulses are intact.  Left lower extremity shortened and externally rotated.  No pain with right hip range of motion.  Neurological: She is alert and oriented to person, place, and time.  Skin: Skin is warm.  Psychiatric: She has a normal mood and affect. Her behavior is normal. Judgment and thought content normal.    Assessment/Plan: Patient's husband at the bedside and procedure discussed with patient and her husband.  Patient has a displaced femoral neck fracture.  Plan left total hip arthroplasty for early mobilization.  Plan postop aspirin with negative history for DVT.  She should be able to go home in 2 to 3 days.  Plan procedure discussed including potential for leg length inequality.  Risk of dislocation, loosening, revision surgery, femur fracture discussed.  Patient understands and requests we proceed.  Mark C Yates 05/06/2017, 8:36  AM      

## 2017-05-07 ENCOUNTER — Ambulatory Visit: Payer: Medicare Other | Admitting: Physical Therapy

## 2017-05-07 ENCOUNTER — Encounter (HOSPITAL_COMMUNITY): Payer: Self-pay | Admitting: Orthopaedic Surgery

## 2017-05-07 LAB — BASIC METABOLIC PANEL
Anion gap: 7 (ref 5–15)
Anion gap: 8 (ref 5–15)
BUN: 14 mg/dL (ref 6–20)
BUN: 14 mg/dL (ref 6–20)
CO2: 22 mmol/L (ref 22–32)
CO2: 25 mmol/L (ref 22–32)
Calcium: 8.1 mg/dL — ABNORMAL LOW (ref 8.9–10.3)
Calcium: 8.4 mg/dL — ABNORMAL LOW (ref 8.9–10.3)
Chloride: 105 mmol/L (ref 101–111)
Chloride: 104 mmol/L (ref 101–111)
Creatinine, Ser: 0.82 mg/dL (ref 0.44–1.00)
Creatinine, Ser: 0.87 mg/dL (ref 0.44–1.00)
GFR calc Af Amer: >60 mL/min (ref >60)
GFR calc Af Amer: >60 mL/min (ref >60)
GFR calc non Af Amer: >60 mL/min (ref >60)
GFR calc non Af Amer: >60 mL/min (ref >60)
Glucose, Bld: 161 mg/dL — ABNORMAL HIGH (ref 65–99)
Glucose, Bld: 132 mg/dL — ABNORMAL HIGH (ref 65–99)
Potassium: 4.1 mmol/L (ref 3.5–5.1)
Potassium: 3.9 mmol/L (ref 3.5–5.1)
Sodium: 134 mmol/L — ABNORMAL LOW (ref 135–145)
Sodium: 137 mmol/L (ref 135–145)

## 2017-05-07 LAB — CBC
HCT: 28.7 % — ABNORMAL LOW (ref 36.0–46.0)
Hemoglobin: 9.5 g/dL — ABNORMAL LOW (ref 12.0–15.0)
MCH: 29.4 pg (ref 26.0–34.0)
MCHC: 33.1 g/dL (ref 30.0–36.0)
MCV: 88.9 fL (ref 78.0–100.0)
Platelets: 157 10*3/uL (ref 150–400)
RBC: 3.23 MIL/uL — ABNORMAL LOW (ref 3.87–5.11)
RDW: 14.5 % (ref 11.5–15.5)
WBC: 9.9 10*3/uL (ref 4.0–10.5)

## 2017-05-07 LAB — HEMOGLOBIN AND HEMATOCRIT, BLOOD
HCT: 30.9 % — ABNORMAL LOW (ref 36.0–46.0)
Hemoglobin: 10.2 g/dL — ABNORMAL LOW (ref 12.0–15.0)

## 2017-05-07 MED ORDER — POLYETHYLENE GLYCOL 3350 17 G PO PACK
17.0000 g | PACK | Freq: Every day | ORAL | Status: DC
  Administered 2017-05-07 – 2017-05-08 (×2): 17 g via ORAL
  Filled 2017-05-07 (×2): qty 1

## 2017-05-07 MED ORDER — ENSURE ENLIVE PO LIQD
237.0000 mL | Freq: Two times a day (BID) | ORAL | Status: DC
  Administered 2017-05-07 – 2017-05-08 (×2): 237 mL via ORAL

## 2017-05-07 NOTE — Progress Notes (Signed)
   Subjective: 1 Day Post-Op Procedure(s) (LRB): TOTAL ANTERIOR HIP ARTHROPLASTY -LEFT (Left) Patient reports pain as mild and moderate.    Objective: Vital signs in last 24 hours: Temp:  [97.8 F (36.6 C)-98.6 F (37 C)] 98.2 F (36.8 C) (04/25 0504) Pulse Rate:  [58-80] 73 (04/25 0504) Resp:  [11-23] 11 (04/24 1927) BP: (88-158)/(34-116) 112/37 (04/25 0504) SpO2:  [89 %-98 %] 95 % (04/25 0504) Weight:  [138 lb (62.6 kg)] 138 lb (62.6 kg) (04/24 1400)  Intake/Output from previous day: 04/24 0701 - 04/25 0700 In: 1300 [I.V.:1300] Out: 1050 [Urine:700; Blood:350] Intake/Output this shift: No intake/output data recorded.  Recent Labs    05/05/17 1830 05/06/17 0942  HGB 13.7 13.2   Recent Labs    05/05/17 1830 05/06/17 0942  WBC 7.5 9.5  RBC 4.67 4.47  HCT 41.1 39.8  PLT 214 196   Recent Labs    05/05/17 1830 05/06/17 0942  NA 133* 135  K 4.1 3.9  CL 99* 103  CO2 21* 24  BUN 19 16  CREATININE 0.79 0.67  GLUCOSE 95 103*  CALCIUM 9.5 8.7*   Recent Labs    05/06/17 0942  INR 0.99    Neurologically intact Dg C-arm 1-60 Min  Result Date: 05/06/2017 CLINICAL DATA:  Left hip replacement EXAM: OPERATIVE left HIP (WITH PELVIS IF PERFORMED) chew VIEWS TECHNIQUE: Fluoroscopic spot image(s) were submitted for interpretation post-operatively. COMPARISON:  05/05/2017 FINDINGS: Two low resolution intraoperative spot views of the left hip. Total fluoroscopy time was 22 seconds. Images demonstrate left hip replacement with normal alignment. IMPRESSION: Intraoperative fluoroscopic assistance provided during left hip replacement Electronically Signed   By: Donavan Foil M.D.   On: 05/06/2017 18:50   Dg C-arm 1-60 Min  Result Date: 05/06/2017 CLINICAL DATA:  Left anterior total hip arthroplasty EXAM: DG C-ARM 61-120 MIN; DG HIP (WITH OR WITHOUT PELVIS) 1V PORT LEFT COMPARISON:  None. FINDINGS: Two C-arm fluoroscopic views acquired intraoperatively. Uncemented left hip  arthroplasty without immediate intraoperative complications. No hardware failure or fracture is identified given limitations of the fluoroscopic technique. IMPRESSION: Intact uncemented left hip arthroplasty. Electronically Signed   By: Ashley Royalty M.D.   On: 05/06/2017 18:41   Dg Hip Port Unilat With Pelvis 1v Left  Result Date: 05/06/2017 CLINICAL DATA:  Left anterior total hip arthroplasty EXAM: DG C-ARM 61-120 MIN; DG HIP (WITH OR WITHOUT PELVIS) 1V PORT LEFT COMPARISON:  None. FINDINGS: Two C-arm fluoroscopic views acquired intraoperatively. Uncemented left hip arthroplasty without immediate intraoperative complications. No hardware failure or fracture is identified given limitations of the fluoroscopic technique. IMPRESSION: Intact uncemented left hip arthroplasty. Electronically Signed   By: Ashley Royalty M.D.   On: 05/06/2017 18:41   Dg Hip Operative Unilat W Or W/o Pelvis Left  Result Date: 05/06/2017 CLINICAL DATA:  Left hip replacement EXAM: OPERATIVE left HIP (WITH PELVIS IF PERFORMED) chew VIEWS TECHNIQUE: Fluoroscopic spot image(s) were submitted for interpretation post-operatively. COMPARISON:  05/05/2017 FINDINGS: Two low resolution intraoperative spot views of the left hip. Total fluoroscopy time was 22 seconds. Images demonstrate left hip replacement with normal alignment. IMPRESSION: Intraoperative fluoroscopic assistance provided during left hip replacement Electronically Signed   By: Donavan Foil M.D.   On: 05/06/2017 18:50    Assessment/Plan: 1 Day Post-Op Procedure(s) (LRB): TOTAL ANTERIOR HIP ARTHROPLASTY -LEFT (Left) Up with therapy, D/C foley  Angela [name] 05/07/2017, 7:28 AM

## 2017-05-07 NOTE — Plan of Care (Signed)

## 2017-05-07 NOTE — Evaluation (Signed)
Physical Therapy Evaluation Patient Details Name: Angela [name] MRN: [id number] DOB: July 20, 1940 Today's Date: 05/07/2017   History of Present Illness  77 y.o. female s/p L THA anterior approach 05/06/17. PMH includes: Osteroporsis, L knee surgery, cervical disectomy.   Clinical Impression  Patient is s/p above surgery resulting in functional limitations due to the deficits listed below (see PT Problem List). PTA, pt living with husband independent with all mobility. Upon eval pt presents with post op pain and weakness, dizziness, that limit her mobility. Ambulating short distance in room with min guard, will progress activity tolerance and therex next visit.   Patient will benefit from skilled PT to increase their independence and safety with mobility to allow discharge to the venue listed below.       Follow Up Recommendations Follow surgeon's recommendation for DC plan and follow-up therapies;Home health PT;Supervision/Assistance - 24 hour    Equipment Recommendations  Rolling walker with 5" wheels;3in1 (PT)    Recommendations for Other Services       Precautions / Restrictions Precautions Precautions: Fall Restrictions Weight Bearing Restrictions: Yes Other Position/Activity Restrictions: WBAT      Mobility  Bed Mobility               General bed mobility comments: OOB at entry  Transfers Overall transfer level: Needs assistance Equipment used: Rolling walker (2 wheeled) Transfers: Sit to/from Stand Sit to Stand: Min guard            Ambulation/Gait Ambulation/Gait assistance: Min guard Ambulation Distance (Feet): 45 Feet Assistive device: Rolling walker (2 wheeled) Gait Pattern/deviations: Step-to pattern Gait velocity: decreased   General Gait Details: short distance ambulation in room, pt became dizzy, SpO2= 82% on RA, cued breathing and rose back to 93% within 20 seconds. notifed Charity fundraiser. Patient able to walk back to chair with min guard   Stairs             Wheelchair Mobility    Modified Rankin (Stroke Patients Only)       Balance Overall balance assessment: Needs assistance   Sitting balance-Leahy Scale: Good       Standing balance-Leahy Scale: Poor                               Pertinent Vitals/Pain Pain Assessment: 0-10 Pain Score: 7  Pain Location: L hip Pain Descriptors / Indicators: Operative site guarding;Contraction Pain Intervention(s): Limited activity within patient's tolerance;Premedicated before session;Monitored during session;Repositioned    Home Living Family/patient expects to be discharged to:: Private residence Living Arrangements: Spouse/significant other Available Help at Discharge: Family Type of Home: House Home Access: Stairs to enter Entrance Stairs-Rails: Left Entrance Stairs-Number of Steps: 1 Home Layout: One level;Able to live on main level with bedroom/bathroom Home Equipment: None      Prior Function Level of Independence: Independent         Comments: driving walking without AD     Hand Dominance        Extremity/Trunk Assessment   Upper Extremity Assessment Upper Extremity Assessment: Defer to OT evaluation    Lower Extremity Assessment Lower Extremity Assessment: (RLE 5/5, LLE, 3/5 post op pain )       Communication   Communication: No difficulties  Cognition Arousal/Alertness: Awake/alert Behavior During Therapy: WFL for tasks assessed/performed Overall Cognitive Status: Within Functional Limits for tasks assessed  General Comments      Exercises     Assessment/Plan    PT Assessment Patient needs continued PT services  PT Problem List Decreased range of motion;Decreased strength;Decreased activity tolerance;Decreased balance;Decreased mobility       PT Treatment Interventions Gait training;DME instruction;Stair training;Functional mobility training;Therapeutic  activities;Therapeutic exercise;Balance training    PT Goals (Current goals can be found in the Care Plan section)  Acute Rehab PT Goals Patient Stated Goal: return home when ready PT Goal Formulation: With patient Time For Goal Achievement: 05/13/17 Potential to Achieve Goals: Good    Frequency 7X/week   Barriers to discharge        Co-evaluation               AM-PAC PT "6 Clicks" Daily Activity  Outcome Measure Difficulty turning over in bed (including adjusting bedclothes, sheets and blankets)?: A Little Difficulty moving from lying on back to sitting on the side of the bed? : A Little Difficulty sitting down on and standing up from a chair with arms (e.g., wheelchair, bedside commode, etc,.)?: A Little Help needed moving to and from a bed to chair (including a wheelchair)?: A Little Help needed walking in hospital room?: A Little Help needed climbing 3-5 steps with a railing? : A Little 6 Click Score: 18    End of Session Equipment Utilized During Treatment: Gait belt Activity Tolerance: Patient tolerated treatment well Patient left: in chair;with call bell/phone within reach;with family/visitor present Nurse Communication: Mobility status PT Visit Diagnosis: Unsteadiness on feet (R26.81);Other abnormalities of gait and mobility (R26.89);Pain Pain - Right/Left: Left Pain - part of body: Hip    Time: 0454-0981 PT Time Calculation (min) (ACUTE ONLY): 24 min   Charges:   PT Evaluation $PT Eval Low Complexity: 1 Low PT Treatments $Gait Training: 8-22 mins   PT G Codes:       Etta Grandchild, PT, DPT Acute Rehab Services Pager: [phone]    Etta Grandchild 05/07/2017, 1:56 PM

## 2017-05-07 NOTE — Progress Notes (Signed)
Physical Therapy Treatment Patient Details Name: Angela [name] MRN: [id number] DOB: Sep 17, 1940 Today's Date: 05/07/2017    History of Present Illness 77 y.o. female s/p L THA anterior approach 05/06/17. PMH includes: Osteroporsis, L knee surgery, cervical disectomy.     PT Comments    Patient progressing well with therapy this afternoon. Ambulating further distances now with close supervision. Able to perform standing therex this visit. Will plan to practice stairs in the AM, may need BID before safe return home, will update in AM.     Follow Up Recommendations  Follow surgeon's recommendation for DC plan and follow-up therapies;Home health PT;Supervision/Assistance - 24 hour     Equipment Recommendations  Rolling walker with 5" wheels;3in1 (PT)    Recommendations for Other Services       Precautions / Restrictions Precautions Precautions: Fall Restrictions Weight Bearing Restrictions: Yes Other Position/Activity Restrictions: WBAT    Mobility  Bed Mobility Overal bed mobility: Needs Assistance Bed Mobility: Supine to Sit;Sit to Supine     Supine to sit: Min guard Sit to supine: Min assist   General bed mobility comments: Min A to assist surgical leg back into bed.   Transfers Overall transfer level: Needs assistance Equipment used: Rolling walker (2 wheeled) Transfers: Sit to/from Stand Sit to Stand: Min guard            Ambulation/Gait Ambulation/Gait assistance: Min guard Ambulation Distance (Feet): 100 Feet Assistive device: Rolling walker (2 wheeled) Gait Pattern/deviations: Step-to pattern Gait velocity: decreased   General Gait Details: pt progressing distance and to supervision.    Stairs             Wheelchair Mobility    Modified Rankin (Stroke Patients Only)       Balance Overall balance assessment: Needs assistance   Sitting balance-Leahy Scale: Good       Standing balance-Leahy Scale: Poor                               Cognition Arousal/Alertness: Awake/alert Behavior During Therapy: WFL for tasks assessed/performed Overall Cognitive Status: Within Functional Limits for tasks assessed                                        Exercises Total Joint Exercises Hip ABduction/ADduction: 10 reps Knee Flexion: 10 reps Marching in Standing: 10 reps Standing Hip Extension: 10 reps    General Comments        Pertinent Vitals/Pain Pain Assessment: Faces Faces Pain Scale: Hurts even more Pain Location: L hip Pain Descriptors / Indicators: Operative site guarding;Contraction Pain Intervention(s): Limited activity within patient's tolerance;Monitored during session;Premedicated before session;Repositioned    Home Living                      Prior Function            PT Goals (current goals can now be found in the care plan section) Acute Rehab PT Goals Patient Stated Goal: return home when ready PT Goal Formulation: With patient Time For Goal Achievement: 05/13/17 Potential to Achieve Goals: Good    Frequency    7X/week      PT Plan Current plan remains appropriate    Co-evaluation              AM-PAC PT "6 Clicks" Daily Activity  Outcome Measure  Difficulty turning over in bed (including adjusting bedclothes, sheets and blankets)?: A Little Difficulty moving from lying on back to sitting on the side of the bed? : A Little Difficulty sitting down on and standing up from a chair with arms (e.g., wheelchair, bedside commode, etc,.)?: A Little Help needed moving to and from a bed to chair (including a wheelchair)?: A Little Help needed walking in hospital room?: A Little Help needed climbing 3-5 steps with a railing? : A Little 6 Click Score: 18    End of Session Equipment Utilized During Treatment: Gait belt Activity Tolerance: Patient tolerated treatment well Patient left: in chair;with call bell/phone within reach;with family/visitor  present Nurse Communication: Mobility status PT Visit Diagnosis: Unsteadiness on feet (R26.81);Other abnormalities of gait and mobility (R26.89);Pain Pain - Right/Left: Left Pain - part of body: Hip     Time: 1710-1730 PT Time Calculation (min) (ACUTE ONLY): 20 min  Charges:  $Gait Training: 8-22 mins                    G Codes:       Etta Grandchild, PT, DPT Acute Rehab Services Pager: [phone]     Etta Grandchild 05/07/2017, 5:40 PM

## 2017-05-07 NOTE — Progress Notes (Signed)
PROGRESS NOTE    Angela [name]  UXL:[id number] DOB: 1940/12/10 DOA: 05/05/2017 PCP: Sigmund Hazel, MD   Brief Narrative:  77 y.o. female who denies any significant past medical history, typically enjoying good health, and now presenting to the ED with severe left hip pain after a fall at home with imaging findings c/w L hip fracture.   Assessment & Plan:   Principal Problem:   Closed left hip fracture, initial encounter (HCC)  1. Left hip fracture  - Presents with severe left hip pain after a mechanical fall at home  - Radiographs reveal left femoral neck fracture with displacement  - Orthopedic surgery is consulting and much appreciated.   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  Planning for surgery.    - Now s/p L total anterior hip arthroplasty on 4/24 - ASA per ortho for DVT ppx - Apap, oxy, robaxin, dilaudid.  Bowel regimen. - She's been on fosamax before with rheum, will need to f/u for continued osteoporosis treatment   # Anemia: acute blood loss anemia.  post op, continue to monitor.  2. Hypoxia: resolved    DVT prophylaxis: ASA, SCD Code Status: full  Family Communication: daughter at bedside.  Husband at bedside Disposition Plan: pending surgery and PT eval   Consultants:   orthopedics  Procedures:   none  Antimicrobials: Anti-infectives (From admission, onward)   Start     Dose/Rate Route Frequency Ordered Stop   05/06/17 2130  ceFAZolin (ANCEF) IVPB 1 g/50 mL premix     1 g 100 mL/hr over 30 Minutes Intravenous Every 8 hours 05/06/17 2024 05/07/17 0535   05/06/17 1430  ceFAZolin (ANCEF) IVPB 2g/100 mL premix     2 g 200 mL/hr over 30 Minutes Intravenous On call to O.R. 05/06/17 0213 05/06/17 1605      Subjective: Feeling well.  Pain ok.   Objective: Vitals:   05/06/17 2020 05/06/17 2036 05/06/17 2355 05/07/17 0504  BP:  (!) 111/34 (!) 105/37 (!) 112/37  Pulse:  80 74 73  Resp:      Temp: 97.8 F (36.6 C) 98.6 F (37 C) 98.3 F (36.8 C) 98.2 F (36.8 C)   TempSrc:  Oral Oral Oral  SpO2:  93% 95% 95%  Weight:      Height:        Intake/Output Summary (Last 24 hours) at 05/07/2017 1544 Last data filed at 05/06/2017 1721 Gross per 24 hour  Intake 1300 ml  Output 1050 ml  Net 250 ml   Filed Weights   05/05/17 1752 05/06/17 1400  Weight: 62.6 kg (138 lb) 62.6 kg (138 lb)    Examination:  General: No acute distress. Cardiovascular: Heart sounds show a regular rate, and rhythm. No gallops or rubs. No murmurs. No JVD. Lungs: Clear to auscultation bilaterally with good air movement. No rales, rhonchi or wheezes. Abdomen: Soft, nontender, nondistended with normal active bowel sounds. No masses. No hepatosplenomegaly. Neurological: Alert and oriented 3. Moves all extremities 4. Cranial nerves II through XII grossly intact. Skin: Warm and dry. No rashes or lesions. Extremities: L hip with intact dressing Psychiatric: Mood and affect are normal. Insight and judgment are appropriate.    Data Reviewed: I have personally reviewed following labs and imaging studies  CBC: Recent Labs  Lab 05/05/17 1830 05/06/17 0942 05/07/17 0636 05/07/17 1153  WBC 7.5 9.5 9.9  --   HGB 13.7 13.2 9.5* 10.2*  HCT 41.1 39.8 28.7* 30.9*  MCV 88.0 89.0 88.9  --  PLT 214 196 157  --    Basic Metabolic Panel: Recent Labs  Lab 05/05/17 1830 05/06/17 0942 05/07/17 0636  NA 133* 135 134*  K 4.1 3.9 4.1  CL 99* 103 105  CO2 21* 24 22  GLUCOSE 95 103* 161*  BUN 19 16 14   CREATININE 0.79 0.67 0.82  CALCIUM 9.5 8.7* 8.1*   GFR: Estimated Creatinine Clearance: 55.5 mL/min (by C-G formula based on SCr of 0.82 mg/dL). Liver Function Tests: No results for input(s): AST, ALT, ALKPHOS, BILITOT, PROT, ALBUMIN in the last 168 hours. No results for input(s): LIPASE, AMYLASE in the last 168 hours. No results for input(s): AMMONIA in the last 168 hours. Coagulation Profile: Recent Labs  Lab 05/06/17 0942  INR 0.99   Cardiac Enzymes: No results for  input(s): CKTOTAL, CKMB, CKMBINDEX, TROPONINI in the last 168 hours. BNP (last 3 results) No results for input(s): PROBNP in the last 8760 hours. HbA1C: No results for input(s): HGBA1C in the last 72 hours. CBG: No results for input(s): GLUCAP in the last 168 hours. Lipid Profile: No results for input(s): CHOL, HDL, LDLCALC, TRIG, CHOLHDL, LDLDIRECT in the last 72 hours. Thyroid Function Tests: No results for input(s): TSH, T4TOTAL, FREET4, T3FREE, THYROIDAB in the last 72 hours. Anemia Panel: No results for input(s): VITAMINB12, FOLATE, FERRITIN, TIBC, IRON, RETICCTPCT in the last 72 hours. Sepsis Labs: No results for input(s): PROCALCITON, LATICACIDVEN in the last 168 hours.  Recent Results (from the past 240 hour(s))  Surgical PCR screen     Status: None   Collection Time: 05/06/17  8:33 AM  Result Value Ref Range Status   MRSA, PCR NEGATIVE NEGATIVE Final   Staphylococcus aureus NEGATIVE NEGATIVE Final    Comment: (NOTE) The Xpert SA Assay (FDA approved for NASAL specimens in patients 6 years of age and older), is one component of a comprehensive surveillance program. It is not intended to diagnose infection nor to guide or monitor treatment. Performed at [facility], [address], [place], [place] [address]          Radiology Studies: Dg Chest 1 View  Result Date: 05/05/2017 CLINICAL DATA:  Fall.  Hip pain. EXAM: CHEST  1 VIEW COMPARISON:  None. FINDINGS: The heart size and mediastinal contours are within normal limits. Both lungs are clear. The visualized skeletal structures are unremarkable. IMPRESSION: No active disease. Electronically Signed   By: Gerome Sam III M.D   On: 05/05/2017 19:10   Dg C-arm 1-60 Min  Result Date: 05/06/2017 CLINICAL DATA:  Left hip replacement EXAM: OPERATIVE left HIP (WITH PELVIS IF PERFORMED) chew VIEWS TECHNIQUE: Fluoroscopic spot image(s) were submitted for interpretation post-operatively. COMPARISON:  05/05/2017  FINDINGS: Two low resolution intraoperative spot views of the left hip. Total fluoroscopy time was 22 seconds. Images demonstrate left hip replacement with normal alignment. IMPRESSION: Intraoperative fluoroscopic assistance provided during left hip replacement Electronically Signed   By: Jasmine Pang M.D.   On: 05/06/2017 18:50   Dg C-arm 1-60 Min  Result Date: 05/06/2017 CLINICAL DATA:  Left anterior total hip arthroplasty EXAM: DG C-ARM 61-120 MIN; DG HIP (WITH OR WITHOUT PELVIS) 1V PORT LEFT COMPARISON:  None. FINDINGS: Two C-arm fluoroscopic views acquired intraoperatively. Uncemented left hip arthroplasty without immediate intraoperative complications. No hardware failure or fracture is identified given limitations of the fluoroscopic technique. IMPRESSION: Intact uncemented left hip arthroplasty. Electronically Signed   By: Tollie Eth M.D.   On: 05/06/2017 18:41   Dg Hip Port Unilat With  Pelvis 1v Left  Result Date: 05/06/2017 CLINICAL DATA:  Left anterior total hip arthroplasty EXAM: DG C-ARM 61-120 MIN; DG HIP (WITH OR WITHOUT PELVIS) 1V PORT LEFT COMPARISON:  None. FINDINGS: Two C-arm fluoroscopic views acquired intraoperatively. Uncemented left hip arthroplasty without immediate intraoperative complications. No hardware failure or fracture is identified given limitations of the fluoroscopic technique. IMPRESSION: Intact uncemented left hip arthroplasty. Electronically Signed   By: Tollie Eth M.D.   On: 05/06/2017 18:41   Dg Hip Operative Unilat W Or W/o Pelvis Left  Result Date: 05/06/2017 CLINICAL DATA:  Left hip replacement EXAM: OPERATIVE left HIP (WITH PELVIS IF PERFORMED) chew VIEWS TECHNIQUE: Fluoroscopic spot image(s) were submitted for interpretation post-operatively. COMPARISON:  05/05/2017 FINDINGS: Two low resolution intraoperative spot views of the left hip. Total fluoroscopy time was 22 seconds. Images demonstrate left hip replacement with normal alignment. IMPRESSION:  Intraoperative fluoroscopic assistance provided during left hip replacement Electronically Signed   By: Jasmine Pang M.D.   On: 05/06/2017 18:50   Dg Hip Unilat With Pelvis 2-3 Views Left  Result Date: 05/05/2017 CLINICAL DATA:  Pain after fall. EXAM: DG HIP (WITH OR WITHOUT PELVIS) 2-3V LEFT COMPARISON:  None. FINDINGS: There is a fracture through the left femoral neck with moderate displacement. No dislocation of the femoral head. No other fractures identified. IMPRESSION: Left femoral neck fracture with displacement. Electronically Signed   By: Gerome Sam III M.D   On: 05/05/2017 19:10        Scheduled Meds: . aspirin EC  325 mg Oral Q breakfast  . docusate sodium  100 mg Oral BID  . feeding supplement (ENSURE ENLIVE)  237 mL Oral BID BM   Continuous Infusions: . lactated ringers 10 mL/hr at 05/06/17 1407  . methocarbamol (ROBAXIN)  IV       LOS: 2 days    Time spent: over 30 min    Lacretia Nicks, MD [facility] Pager [phone]  If 7PM-7AM, please contact night-coverage [email or web address] Password [facility] 05/07/2017, 3:44 PM

## 2017-05-08 LAB — CBC
HCT: 28.5 % — ABNORMAL LOW (ref 36.0–46.0)
Hemoglobin: 9.4 g/dL — ABNORMAL LOW (ref 12.0–15.0)
MCH: 29.6 pg (ref 26.0–34.0)
MCHC: 33 g/dL (ref 30.0–36.0)
MCV: 89.6 fL (ref 78.0–100.0)
Platelets: 148 10*3/uL — ABNORMAL LOW (ref 150–400)
RBC: 3.18 MIL/uL — ABNORMAL LOW (ref 3.87–5.11)
RDW: 14.7 % (ref 11.5–15.5)
WBC: 8.2 10*3/uL (ref 4.0–10.5)

## 2017-05-08 LAB — BASIC METABOLIC PANEL
Anion gap: 9 (ref 5–15)
BUN: 10 mg/dL (ref 6–20)
CO2: 24 mmol/L (ref 22–32)
Calcium: 8.2 mg/dL — ABNORMAL LOW (ref 8.9–10.3)
Chloride: 106 mmol/L (ref 101–111)
Creatinine, Ser: 0.73 mg/dL (ref 0.44–1.00)
GFR calc Af Amer: >60 mL/min (ref >60)
GFR calc non Af Amer: >60 mL/min (ref >60)
Glucose, Bld: 93 mg/dL (ref 65–99)
Potassium: 3.8 mmol/L (ref 3.5–5.1)
Sodium: 139 mmol/L (ref 135–145)

## 2017-05-08 LAB — MAGNESIUM: Magnesium: 1.8 mg/dL (ref 1.7–2.4)

## 2017-05-08 MED ORDER — ACETAMINOPHEN 325 MG PO TABS: 650.0000 mg | ORAL_TABLET | Freq: Four times a day (QID) | ORAL | 2 refills | Status: AC | PRN

## 2017-05-08 MED ORDER — OXYCODONE HCL 5 MG PO TABS: 5.0000 mg | ORAL_TABLET | Freq: Four times a day (QID) | ORAL | 0 refills | Status: DC | PRN

## 2017-05-08 MED ORDER — SENNOSIDES-DOCUSATE SODIUM 8.6-50 MG PO TABS: 1.0000 | ORAL_TABLET | Freq: Every evening | ORAL | 0 refills | Status: AC | PRN

## 2017-05-08 MED ORDER — POLYETHYLENE GLYCOL 3350 17 G PO PACK: 17.0000 g | PACK | Freq: Every day | ORAL | 0 refills | Status: DC

## 2017-05-08 MED ORDER — ASPIRIN 325 MG PO TBEC: 325.0000 mg | DELAYED_RELEASE_TABLET | Freq: Every day | ORAL | 0 refills | Status: AC

## 2017-05-08 NOTE — Progress Notes (Signed)
Physical Therapy Treatment and Discharge Patient Details Name: Angela [name] MRN: [id number] DOB: 02/12/1940 Today's Date: 05/08/2017    History of Present Illness 77 y.o. female s/p L THA anterior approach 05/06/17. PMH includes: Osteroporsis, L knee surgery, cervical disectomy.     PT Comments    Patient has progressed well with therapy. Session focused on stair training and educating husband on proper guarding for safe home entry today. Both pt and husband report comfort in performing stairs at home, and are able to do without physical assistance at this time. Pt and family educated on safety considerations for home and have no further questions or concerns at this time. Pt has met all functional goals and will benefit from skilled home health PT when medically cleared for d/c.      Follow Up Recommendations  Follow surgeon's recommendation for DC plan and follow-up therapies;Home health PT;Supervision/Assistance - 24 hour     Equipment Recommendations  Rolling walker with 5" wheels;3in1 (PT)    Recommendations for Other Services       Precautions / Restrictions Precautions Precautions: Fall Restrictions Weight Bearing Restrictions: Yes Other Position/Activity Restrictions: WBAT    Mobility  Bed Mobility Overal bed mobility: Needs Assistance Bed Mobility: Supine to Sit;Sit to Supine     Supine to sit: Supervision Sit to supine: Supervision      Transfers Overall transfer level: Needs assistance Equipment used: Rolling walker (2 wheeled) Transfers: Sit to/from Stand Sit to Stand: Supervision            Ambulation/Gait Ambulation/Gait assistance: Supervision Ambulation Distance (Feet): 100 Feet Assistive device: Rolling walker (2 wheeled) Gait Pattern/deviations: Step-through pattern Gait velocity: decreased       Stairs Stairs: Yes Stairs assistance: Supervision Stair Management: One rail Left;Sideways;Step to pattern Number of Stairs: 4 General  stair comments: patient with supervision this visit, husband closly guarding. pt with improved mechanics on stairs and no LOB. both report comfort in entering home today after d/c   Wheelchair Mobility    Modified Rankin (Stroke Patients Only)       Balance Overall balance assessment: Needs assistance   Sitting balance-Leahy Scale: Good       Standing balance-Leahy Scale: Poor                              Cognition Arousal/Alertness: Awake/alert Behavior During Therapy: WFL for tasks assessed/performed Overall Cognitive Status: Within Functional Limits for tasks assessed                                        Exercises      General Comments        Pertinent Vitals/Pain Pain Assessment: Faces Faces Pain Scale: Hurts even more Pain Location: L hip Pain Descriptors / Indicators: Operative site guarding;Contraction Pain Intervention(s): Limited activity within patient's tolerance;Monitored during session;Premedicated before session;Repositioned    Home Living                      Prior Function            PT Goals (current goals can now be found in the care plan section) Acute Rehab PT Goals Patient Stated Goal: return home when ready PT Goal Formulation: With patient Time For Goal Achievement: 05/13/17 Potential to Achieve Goals: Good    Frequency  PT Plan Current plan remains appropriate    Co-evaluation              AM-PAC PT "6 Clicks" Daily Activity  Outcome Measure  Difficulty turning over in bed (including adjusting bedclothes, sheets and blankets)?: A Little Difficulty moving from lying on back to sitting on the side of the bed? : A Little Difficulty sitting down on and standing up from a chair with arms (e.g., wheelchair, bedside commode, etc,.)?: A Little Help needed moving to and from a bed to chair (including a wheelchair)?: A Little Help needed walking in hospital room?: A  Little Help needed climbing 3-5 steps with a railing? : A Little 6 Click Score: 18    End of Session Equipment Utilized During Treatment: Gait belt Activity Tolerance: Patient tolerated treatment well Patient left: in chair;with call bell/phone within reach;with family/visitor present Nurse Communication: Mobility status PT Visit Diagnosis: Unsteadiness on feet (R26.81);Other abnormalities of gait and mobility (R26.89);Pain Pain - Right/Left: Left Pain - part of body: Hip     Time: 1212-1230 PT Time Calculation (min) (ACUTE ONLY): 18 min  Charges:  $Gait Training: 8-22 mins                    G Codes:       Etta Grandchild, PT, DPT Acute Rehab Services Pager: [phone]     Etta Grandchild 05/08/2017, 1:16 PM

## 2017-05-08 NOTE — Progress Notes (Signed)
   Subjective: 2 Days Post-Op Procedure(s) (LRB): TOTAL ANTERIOR HIP ARTHROPLASTY -LEFT (Left) Patient reports pain as mild.  Just tylenol for pain since yesterday at noon. She states not sure she wants anything else.   Objective: Vital signs in last 24 hours: Temp:  [98.6 F (37 C)-99.5 F (37.5 C)] 99.5 F (37.5 C) (04/26 0434) Pulse Rate:  [70-79] 75 (04/26 0434) Resp:  [15-16] 16 (04/26 0434) BP: (97-106)/(37-46) 97/46 (04/26 0434) SpO2:  [92 %-95 %] 94 % (04/26 0434)  Intake/Output from previous day: 04/25 0701 - 04/26 0700 In: -  Out: 600 [Urine:600] Intake/Output this shift: Total I/O In: 120 [P.O.:120] Out: -   Recent Labs    05/05/17 1830 05/06/17 0942 05/07/17 0636 05/07/17 1153 05/08/17 0637  HGB 13.7 13.2 9.5* 10.2* 9.4*   Recent Labs    05/07/17 0636 05/07/17 1153 05/08/17 0637  WBC 9.9  --  8.2  RBC 3.23*  --  3.18*  HCT 28.7* 30.9* 28.5*  PLT 157  --  148*   Recent Labs    05/07/17 1621 05/08/17 0637  NA 137 139  K 3.9 3.8  CL 104 106  CO2 25 24  BUN 14 10  CREATININE 0.87 0.73  GLUCOSE 132* 93  CALCIUM 8.4* 8.2*   Recent Labs    05/06/17 0942  INR 0.99    Neurologically intact  LL equal .  No results found.  Assessment/Plan: 2 Days Post-Op Procedure(s) (LRB): TOTAL ANTERIOR HIP ARTHROPLASTY -LEFT (Left) Plan ; discharge home. ASA 325 one po daily for DVT prophy. Times 4 wks. Office one week to see me.   Angela [name] 05/08/2017, 12:11 PM

## 2017-05-08 NOTE — Plan of Care (Signed)
  Problem: Pain Managment: Goal: General experience of comfort will improve Outcome: Progressing   

## 2017-05-08 NOTE — Progress Notes (Signed)
Physical Therapy Treatment Patient Details Name: Angela [name] MRN: [id number] DOB: [birth year] Today's Date: 05/08/2017    History of Present Illness 77 y.o. female s/p L THA anterior approach 05/06/17. PMH includes: Osteroporsis, L knee surgery, cervical disectomy.     PT Comments    Patient progressing well this AM with therapy. Supervision level for bed mobility transfers and gait with improving mechanics. Practicing stairs with min guard, plan to see early afternoon to reinforce before signing off from PT, no concerns for return home today if medically ready.      Follow Up Recommendations  Follow surgeon's recommendation for DC plan and follow-up therapies;Home health PT;Supervision/Assistance - 24 hour     Equipment Recommendations  Rolling walker with 5" wheels;3in1 (PT)    Recommendations for Other Services       Precautions / Restrictions Precautions Precautions: Fall Restrictions Weight Bearing Restrictions: Yes Other Position/Activity Restrictions: WBAT    Mobility  Bed Mobility Overal bed mobility: Needs Assistance Bed Mobility: Supine to Sit;Sit to Supine     Supine to sit: Supervision Sit to supine: Supervision      Transfers Overall transfer level: Needs assistance Equipment used: Rolling walker (2 wheeled) Transfers: Sit to/from Stand Sit to Stand: Supervision            Ambulation/Gait Ambulation/Gait assistance: Supervision Ambulation Distance (Feet): 150 Feet Assistive device: Rolling walker (2 wheeled) Gait Pattern/deviations: Step-through pattern Gait velocity: decreased   General Gait Details: pt progressing distance and to supervision. step through pattern    Stairs Stairs: Yes Stairs assistance: Min guard Stair Management: One rail Left;Sideways;Step to pattern Number of Stairs: 4 General stair comments: cues for sequencing, min guard at this time. educated husband on proper guarding   Wheelchair Mobility    Modified  Rankin (Stroke Patients Only)       Balance Overall balance assessment: Needs assistance   Sitting balance-Leahy Scale: Good       Standing balance-Leahy Scale: Poor                              Cognition Arousal/Alertness: Awake/alert Behavior During Therapy: WFL for tasks assessed/performed Overall Cognitive Status: Within Functional Limits for tasks assessed                                        Exercises      General Comments        Pertinent Vitals/Pain Pain Assessment: Faces Faces Pain Scale: Hurts even more Pain Location: L hip Pain Descriptors / Indicators: Operative site guarding;Contraction Pain Intervention(s): Limited activity within patient's tolerance;Monitored during session;Premedicated before session;Repositioned    Home Living                      Prior Function            PT Goals (current goals can now be found in the care plan section) Acute Rehab PT Goals Patient Stated Goal: return home when ready PT Goal Formulation: With patient Time For Goal Achievement: 05/13/17 Potential to Achieve Goals: Good    Frequency    7X/week      PT Plan Current plan remains appropriate    Co-evaluation              AM-PAC PT "6 Clicks" Daily Activity  Outcome Measure  Difficulty turning over  in bed (including adjusting bedclothes, sheets and blankets)?: A Little Difficulty moving from lying on back to sitting on the side of the bed? : A Little Difficulty sitting down on and standing up from a chair with arms (e.g., wheelchair, bedside commode, etc,.)?: A Little Help needed moving to and from a bed to chair (including a wheelchair)?: A Little Help needed walking in hospital room?: A Little Help needed climbing 3-5 steps with a railing? : A Little 6 Click Score: 18    End of Session Equipment Utilized During Treatment: Gait belt Activity Tolerance: Patient tolerated treatment well Patient left: in  chair;with call bell/phone within reach;with family/visitor present Nurse Communication: Mobility status PT Visit Diagnosis: Unsteadiness on feet (R26.81);Other abnormalities of gait and mobility (R26.89);Pain Pain - Right/Left: Left Pain - part of body: Hip     Time: 6045-4098 PT Time Calculation (min) (ACUTE ONLY): 25 min  Charges:  $Gait Training: 8-22 mins                    G Codes:      Etta Grandchild, PT, DPT Acute Rehab Services Pager: [phone]     Etta Grandchild 05/08/2017, 9:15 AM

## 2017-05-08 NOTE — Discharge Summary (Signed)
Physician Discharge Summary  Angela [name]:[id number] DOB: [birth year] DOA: 05/05/2017  PCP: Sigmund Hazel, MD  Admit date: 05/05/2017 Discharge date: 05/08/2017  Time spent: 35 minutes  Recommendations for Outpatient Follow-up:  1. Follow up outpatient CBC/CMP 2. Follow up with ortho as outpatient 3. Follow up with rheum to follow up osteoporosis  Discharge Diagnoses:  Principal Problem:   Closed left hip fracture, initial encounter [facility])   Discharge Condition: stable  Diet recommendation: heart healthy  Filed Weights   05/05/17 1752 05/06/17 1400  Weight: 62.6 kg (138 lb) 62.6 kg (138 lb)    History of present illness:  77 y.o.femalewho denies any significant past medical history, typically enjoying good health, and now presenting to the ED with severe left hip pain after a fall at home with imaging findings c/w L hip fracture.  Now s/p L hip arthroplasty.  Doing well at d/c.   Hospital Course:  1.Left hip fracture -Presented with severe left hip pain after a mechanical fall at home -Radiographs revealed left femoral neck fracture with displacement -Orthopedic surgery c/s   - Now s/p L total anterior hip arthroplasty on 4/24 - ASA per ortho for DVT ppx x 4 weeks - Discharged with APAP.  Sent with oxycodone, just in case, though she hadn't complained of needing additional pain medications.  Bowel regimen.  PMP aware checked.  - continue vit D supplementation.  Calcium in diet. Follow up with rheum who's managed osteoporosis.   # Anemia: acute blood loss anemia.  post op, continue to monitor.  2. Hypoxia: resolved    Procedures:  Right direct anterior total hip arthroplasty 05/06/17   Consultations:  orthopedics  Discharge Exam: Vitals:   05/07/17 2124 05/08/17 0434  BP: (!) 105/37 (!) 97/46  Pulse: 70 75  Resp: 15 16  Temp: 98.6 F (37 C) 99.5 F (37.5 C)  SpO2: 95% 94%   Feeling well.  No complaints.  No pain.  General: No acute  distress. Cardiovascular: Heart sounds show a regular rate, and rhythm. No gallops or rubs. No murmurs. No JVD. Lungs: Clear to auscultation bilaterally with good air movement. No rales, rhonchi or wheezes. Abdomen: Soft, nontender, nondistended with normal active bowel sounds. No masses. No hepatosplenomegaly. Neurological: Alert and oriented 3. Moves all extremities 4. Cranial nerves II through XII grossly intact. Skin: Warm and dry. No rashes or lesions. Extremities: No clubbing or cyanosis. No edema. Pedal pulses 2+.  Dressing on LLE.  Psychiatric: Mood and affect are normal. Insight and judgment are appropriate.  Discharge Instructions   Discharge Instructions    Call MD for:  difficulty breathing, headache or visual disturbances   Complete by:  As directed    Call MD for:  persistant dizziness or light-headedness   Complete by:  As directed    Call MD for:  persistant nausea and vomiting   Complete by:  As directed    Call MD for:  redness, tenderness, or signs of infection (pain, swelling, redness, odor or green/yellow discharge around incision site)   Complete by:  As directed    Call MD for:  severe uncontrolled pain   Complete by:  As directed    Call MD for:  temperature >100.4   Complete by:  As directed    Diet - low sodium heart healthy   Complete by:  As directed    Discharge instructions   Complete by:  As directed    You were seen for a hip fracture.  This was repaired by surgery.  Please follow up with surgery in about 1 week.   Please follow up with your PCP in a few days.  Take aspirin daily for the next 30 days.  I prescribed miralax and senna which you can take for constipation.  I prescribed some oxycodone which you can take as needed for pain not controlled with tylenol.    Resume your vitamin D.  Ensure you get enough calcium in your diet.  Please follow up with your rheumatologist for your osteoporosis.  Return for new, recurrent, or worsening  symptoms.  Please ask your PCP to request records from this hospitalization so they know what was done and what the next steps will be.   Increase activity slowly   Complete by:  As directed      Allergies as of 05/08/2017      Reactions   Statins Nausea Only      Medication List    TAKE these medications   acetaminophen 325 MG tablet Commonly known as:  TYLENOL Take 2 tablets (650 mg total) by mouth every 6 (six) hours as needed.   aspirin 325 MG EC tablet Take 1 tablet (325 mg total) by mouth daily with breakfast. Start taking on:  05/09/2017   FISH OIL PO Take 1 capsule by mouth daily.   MULTIVITAMIN PO Take 1 tablet by mouth daily.   nabumetone 500 MG tablet Commonly known as:  RELAFEN Take 500 mg by mouth every 12 (twelve) hours.   oxyCODONE 5 MG immediate release tablet Commonly known as:  Oxy IR/ROXICODONE Take 1 tablet (5 mg total) by mouth every 6 (six) hours as needed for up to 16 doses for moderate pain (pain score 4-6).   polyethylene glycol packet Commonly known as:  MIRALAX Take 17 g by mouth daily.   senna-docusate 8.6-50 MG tablet Commonly known as:  Senokot-S Take 1 tablet by mouth at bedtime as needed for up to 20 days for mild constipation.   VITAMIN D-3 PO Take 1 capsule by mouth daily.      Allergies  Allergen Reactions  . Statins Nausea Only   Follow-up Information    Eldred Manges, MD Follow up in 1 week(s).   Specialty:  Orthopedic Surgery Contact information: [address] [place] [address] [phone]            The results of significant diagnostics from this hospitalization (including imaging, microbiology, ancillary and laboratory) are listed below for reference.    Significant Diagnostic Studies: Dg Chest 1 View  Result Date: 05/05/2017 CLINICAL DATA:  Fall.  Hip pain. EXAM: CHEST  1 VIEW COMPARISON:  None. FINDINGS: The heart size and mediastinal contours are within normal limits. Both lungs are  clear. The visualized skeletal structures are unremarkable. IMPRESSION: No active disease. Electronically Signed   By: Gerome Sam III M.D   On: 05/05/2017 19:10   Dg C-arm 1-60 Min  Result Date: 05/06/2017 CLINICAL DATA:  Left hip replacement EXAM: OPERATIVE left HIP (WITH PELVIS IF PERFORMED) chew VIEWS TECHNIQUE: Fluoroscopic spot image(s) were submitted for interpretation post-operatively. COMPARISON:  05/05/2017 FINDINGS: Two low resolution intraoperative spot views of the left hip. Total fluoroscopy time was 22 seconds. Images demonstrate left hip replacement with normal alignment. IMPRESSION: Intraoperative fluoroscopic assistance provided during left hip replacement Electronically Signed   By: Jasmine Pang M.D.   On: 05/06/2017 18:50   Dg C-arm 1-60 Min  Result Date: 05/06/2017 CLINICAL DATA:  Left anterior total  hip arthroplasty EXAM: DG C-ARM 61-120 MIN; DG HIP (WITH OR WITHOUT PELVIS) 1V PORT LEFT COMPARISON:  None. FINDINGS: Two C-arm fluoroscopic views acquired intraoperatively. Uncemented left hip arthroplasty without immediate intraoperative complications. No hardware failure or fracture is identified given limitations of the fluoroscopic technique. IMPRESSION: Intact uncemented left hip arthroplasty. Electronically Signed   By: Tollie Eth M.D.   On: 05/06/2017 18:41   Dg Hip Port Unilat With Pelvis 1v Left  Result Date: 05/06/2017 CLINICAL DATA:  Left anterior total hip arthroplasty EXAM: DG C-ARM 61-120 MIN; DG HIP (WITH OR WITHOUT PELVIS) 1V PORT LEFT COMPARISON:  None. FINDINGS: Two C-arm fluoroscopic views acquired intraoperatively. Uncemented left hip arthroplasty without immediate intraoperative complications. No hardware failure or fracture is identified given limitations of the fluoroscopic technique. IMPRESSION: Intact uncemented left hip arthroplasty. Electronically Signed   By: Tollie Eth M.D.   On: 05/06/2017 18:41   Dg Hip Operative Unilat W Or W/o Pelvis  Left  Result Date: 05/06/2017 CLINICAL DATA:  Left hip replacement EXAM: OPERATIVE left HIP (WITH PELVIS IF PERFORMED) chew VIEWS TECHNIQUE: Fluoroscopic spot image(s) were submitted for interpretation post-operatively. COMPARISON:  05/05/2017 FINDINGS: Two low resolution intraoperative spot views of the left hip. Total fluoroscopy time was 22 seconds. Images demonstrate left hip replacement with normal alignment. IMPRESSION: Intraoperative fluoroscopic assistance provided during left hip replacement Electronically Signed   By: Jasmine Pang M.D.   On: 05/06/2017 18:50   Dg Hip Unilat With Pelvis 2-3 Views Left  Result Date: 05/05/2017 CLINICAL DATA:  Pain after fall. EXAM: DG HIP (WITH OR WITHOUT PELVIS) 2-3V LEFT COMPARISON:  None. FINDINGS: There is a fracture through the left femoral neck with moderate displacement. No dislocation of the femoral head. No other fractures identified. IMPRESSION: Left femoral neck fracture with displacement. Electronically Signed   By: Gerome Sam III M.D   On: 05/05/2017 19:10    Microbiology: Recent Results (from the past 240 hour(s))  Surgical PCR screen     Status: None   Collection Time: 05/06/17  8:33 AM  Result Value Ref Range Status   MRSA, PCR NEGATIVE NEGATIVE Final   Staphylococcus aureus NEGATIVE NEGATIVE Final    Comment: (NOTE) The Xpert SA Assay (FDA approved for NASAL specimens in patients 85 years of age and older), is one component of a comprehensive surveillance program. It is not intended to diagnose infection nor to guide or monitor treatment. Performed at [facility], [address], [place], [place] [address]      Labs: Basic Metabolic Panel: Recent Labs  Lab 05/05/17 1830 05/06/17 0942 05/07/17 0636 05/07/17 1621 05/08/17 0637  NA 133* 135 134* 137 139  K 4.1 3.9 4.1 3.9 3.8  CL 99* 103 105 104 106  CO2 21* 24 22 25 24   GLUCOSE 95 103* 161* 132* 93  BUN 19 16 14 14 10   CREATININE 0.79 0.67 0.82 0.87 0.73   CALCIUM 9.5 8.7* 8.1* 8.4* 8.2*  MG  --   --   --   --  1.8   Liver Function Tests: No results for input(s): AST, ALT, ALKPHOS, BILITOT, PROT, ALBUMIN in the last 168 hours. No results for input(s): LIPASE, AMYLASE in the last 168 hours. No results for input(s): AMMONIA in the last 168 hours. CBC: Recent Labs  Lab 05/05/17 1830 05/06/17 0942 05/07/17 0636 05/07/17 1153 05/08/17 0637  WBC 7.5 9.5 9.9  --  8.2  HGB 13.7 13.2 9.5* 10.2* 9.4*  HCT 41.1 39.8 28.7*  30.9* 28.5*  MCV 88.0 89.0 88.9  --  89.6  PLT 214 196 157  --  148*   Cardiac Enzymes: No results for input(s): CKTOTAL, CKMB, CKMBINDEX, TROPONINI in the last 168 hours. BNP: BNP (last 3 results) No results for input(s): BNP in the last 8760 hours.  ProBNP (last 3 results) No results for input(s): PROBNP in the last 8760 hours.  CBG: No results for input(s): GLUCAP in the last 168 hours.     Signed:  Lacretia Nicks MD.  [facility] 05/08/2017, 7:48 PM

## 2017-05-08 NOTE — Care Management Note (Signed)
Case Management Note  Patient Details  Name: Angela [name] MRN: [id number] Date of Birth: 03/17/1940  Subjective/Objective:   77 yr old female s/p left total hip arthroplasty.                 Action/Plan: Case manager spoke with patient and her husband concerning discharge plan. Patient will not have [place], will follow up with Dr. Lorin Mercy. She will have family support at discharge.    Expected Discharge Date:   05/08/17               Expected Discharge Plan:  Home/self Care In-House Referral:  NA  Discharge planning Services  CM Consult  Post Acute Care Choice:  Durable Medical Equipment, Home Health Choice offered to:  Patient  DME Arranged:  Gilford Rile rolling/3in1 DME Agency:  [place]:  NA HH Agency:   Status of Service:  Completed, signed off  If discussed at [place] of Stay Meetings, dates discussed:    Additional Comments:  Ninfa Meeker, RN 05/08/2017, 12:30 PM

## 2017-05-08 NOTE — Care Management Important Message (Signed)
Important Message  Patient Details  Name: Angela [name] MRN: [id number] Date of Birth: [birth year]   Medicare Important Message Given:  Yes    Orbie Pyo 05/08/2017, 1:44 PM

## 2017-05-20 ENCOUNTER — Ambulatory Visit (INDEPENDENT_AMBULATORY_CARE_PROVIDER_SITE_OTHER): Payer: Medicare Other | Admitting: Orthopaedic Surgery

## 2017-05-20 ENCOUNTER — Ambulatory Visit (INDEPENDENT_AMBULATORY_CARE_PROVIDER_SITE_OTHER): Payer: Medicare Other

## 2017-05-20 ENCOUNTER — Encounter (INDEPENDENT_AMBULATORY_CARE_PROVIDER_SITE_OTHER): Payer: Self-pay | Admitting: Orthopaedic Surgery

## 2017-05-20 VITALS — BP 127/50 | HR 71 | Ht 66.0 in | Wt 140.0 lb

## 2017-05-20 DIAGNOSIS — Z96642 Presence of left artificial hip joint: Secondary | ICD-10-CM

## 2017-05-20 DIAGNOSIS — M25552 Pain in left hip: Secondary | ICD-10-CM | POA: Diagnosis not present

## 2017-05-20 NOTE — Progress Notes (Signed)
Post-Op Visit Note   Patient: Angela [name]           Date of Birth: [birth year]           MRN: [id number] Visit Date: 05/20/2017 PCP: Sigmund Hazel, MD   Assessment & Plan: Follow-up left total hip arthroplasty for displaced femoral neck fracture.  Steri-Strips changed incision looks good she is walking in the house and gradually progressed to walking in the neighborhood.  Return in 1 month.  No x-ray needed on return.  Standing with knees extended pelvis is level.  Chief Complaint:  Chief Complaint  Patient presents with  . Left Hip - Routine Post Op   Visit Diagnoses:  1. S/P hip replacement, left     Plan: Continue  ambulation return in 1 month.  Follow-Up Instructions: Return in about 1 month (around 06/20/2017).   Orders:  Orders Placed This Encounter  Procedures  . XR HIP UNILAT W OR W/O PELVIS 2-3 VIEWS LEFT   No orders of the defined types were placed in this encounter.   Imaging: Xr Hip Unilat W Or W/o Pelvis 2-3 Views Left  Result Date: 05/20/2017 AP pelvis frog-leg x-ray left hip shows good fit and fill total hip arthroplasty. Impression: Satisfactory post left total hip arthroplasty for displaced femoral neck fracture.   PMFS History: Patient Active Problem List   Diagnosis Date Noted  . Closed left hip fracture, initial encounter (HCC) 05/05/2017  . Polymyalgia (HCC) 07/08/2013   Past Medical History:  Diagnosis Date  . Carpal tunnel syndrome on right 10/08  . Elevated cholesterol 5/93  . Nephrolithiasis 2010  . Osteoporosis   . Polymyalgia (HCC) 2/97    Family History  Problem Relation Age of Onset  . Hypertension Mother   . Thyroid disease Mother   . Stroke Father     Past Surgical History:  Procedure Laterality Date  . BLEPHAROPLASTY Bilateral 2/14   Dr. Shawna Orleans  . CARPAL TUNNEL RELEASE Right 6/08  . CERVICAL DISCECTOMY  12/91  . KNEE SURGERY Left   . spots removed     frozen and biopsied-all benign  . TONSILLECTOMY AND  ADENOIDECTOMY    . TOTAL HIP ARTHROPLASTY Left 05/06/2017   Procedure: TOTAL ANTERIOR HIP ARTHROPLASTY -LEFT;  Surgeon: Eldred Manges, MD;  Location: [facility] OR;  Service: Orthopedics;  Laterality: Left;   Social History   Occupational History  . Not on file  Tobacco Use  . Smoking status: Never Smoker  . Smokeless tobacco: Never Used  Substance and Sexual Activity  . Alcohol use: Yes    Alcohol/week: 0.6 oz    Types: 1 Standard drinks or equivalent per week    Comment: 2 Drinks/month  . Drug use: No  . Sexual activity: Never    Partners: Male    Birth control/protection: Other-see comments    Comment: spouse with vasectomy

## 2017-06-17 DIAGNOSIS — Z1231 Encounter for screening mammogram for malignant neoplasm of breast: Secondary | ICD-10-CM | POA: Diagnosis not present

## 2017-06-23 ENCOUNTER — Encounter: Payer: Self-pay | Admitting: Obstetrics & Gynecology

## 2017-06-24 ENCOUNTER — Ambulatory Visit (INDEPENDENT_AMBULATORY_CARE_PROVIDER_SITE_OTHER): Payer: Medicare Other | Admitting: Orthopaedic Surgery

## 2017-06-24 ENCOUNTER — Encounter (INDEPENDENT_AMBULATORY_CARE_PROVIDER_SITE_OTHER): Payer: Self-pay | Admitting: Orthopaedic Surgery

## 2017-06-24 VITALS — BP 134/53 | HR 62 | Ht 66.0 in | Wt 140.0 lb

## 2017-06-24 DIAGNOSIS — S72002A Fracture of unspecified part of neck of left femur, initial encounter for closed fracture: Secondary | ICD-10-CM

## 2017-06-24 DIAGNOSIS — Z96642 Presence of left artificial hip joint: Secondary | ICD-10-CM

## 2017-06-24 NOTE — Progress Notes (Signed)
Post-Op Visit Note   Patient: Angela [name]           Date of Birth: 06/28/1940           MRN: [id number] Visit Date: 06/24/2017 PCP: Sigmund Hazel, MD   Assessment & Plan: Post left total hip arthroplasty for displaced left femoral neck fracture with direct anterior approach.  She is ambulating with a quad cane but can ambulate without it.  She will work on abduction exercises continuing to increase her walking distance and endurance.  I plan to recheck her back in 3 months.  No x-ray needed on return  Chief Complaint:  Chief Complaint  Patient presents with  . Left Hip - Routine Post Op   Visit Diagnoses:  1. Closed left hip fracture, initial encounter (HCC)   2. History of total hip arthroplasty, left     Plan: Patient had some mild swelling left lower extremity after surgery as expected.  Hip incision is well-healed.  She is had some problems with hand numbness had previous carpal tunnel release on the left years ago.  She is now off the walker and this likely was aggravating her carpal tunnel symptoms.  I plan to recheck her again in 3 months for recheck.  She can elevate her foot for the swelling that she notes during the afternoon.  Follow-Up Instructions: Return in about 3 months (around 09/24/2017).   Orders:  No orders of the defined types were placed in this encounter.  No orders of the defined types were placed in this encounter.   Imaging: No results found.  PMFS History: Patient Active Problem List   Diagnosis Date Noted  . Closed left hip fracture, initial encounter (HCC) 05/05/2017  . Polymyalgia (HCC) 07/08/2013   Past Medical History:  Diagnosis Date  . Carpal tunnel syndrome on right 10/08  . Elevated cholesterol 5/93  . Nephrolithiasis 2010  . Osteoporosis   . Polymyalgia (HCC) 2/97    Family History  Problem Relation Age of Onset  . Hypertension Mother   . Thyroid disease Mother   . Stroke Father     Past Surgical History:  Procedure  Laterality Date  . BLEPHAROPLASTY Bilateral 2/14   Dr. Shawna Orleans  . CARPAL TUNNEL RELEASE Right 6/08  . CERVICAL DISCECTOMY  12/91  . KNEE SURGERY Left   . spots removed     frozen and biopsied-all benign  . TONSILLECTOMY AND ADENOIDECTOMY    . TOTAL HIP ARTHROPLASTY Left 05/06/2017   Procedure: TOTAL ANTERIOR HIP ARTHROPLASTY -LEFT;  Surgeon: Eldred Manges, MD;  Location: [facility] OR;  Service: Orthopedics;  Laterality: Left;   Social History   Occupational History  . Not on file  Tobacco Use  . Smoking status: Never Smoker  . Smokeless tobacco: Never Used  Substance and Sexual Activity  . Alcohol use: Yes    Alcohol/week: 0.6 oz    Types: 1 Standard drinks or equivalent per week    Comment: 2 Drinks/month  . Drug use: No  . Sexual activity: Never    Partners: Male    Birth control/protection: Other-see comments    Comment: spouse with vasectomy

## 2017-07-10 ENCOUNTER — Encounter (INDEPENDENT_AMBULATORY_CARE_PROVIDER_SITE_OTHER): Payer: Self-pay | Admitting: Orthopaedic Surgery

## 2017-07-10 ENCOUNTER — Ambulatory Visit (INDEPENDENT_AMBULATORY_CARE_PROVIDER_SITE_OTHER): Payer: Self-pay

## 2017-07-10 ENCOUNTER — Ambulatory Visit (INDEPENDENT_AMBULATORY_CARE_PROVIDER_SITE_OTHER): Payer: Medicare Other | Admitting: Orthopaedic Surgery

## 2017-07-10 VITALS — BP 146/60 | HR 62 | Ht 66.0 in | Wt 140.0 lb

## 2017-07-10 DIAGNOSIS — Z96642 Presence of left artificial hip joint: Secondary | ICD-10-CM | POA: Diagnosis not present

## 2017-07-10 DIAGNOSIS — T849XXA Unspecified complication of internal orthopedic prosthetic device, implant and graft, initial encounter: Secondary | ICD-10-CM | POA: Insufficient documentation

## 2017-07-10 DIAGNOSIS — T84091D Other mechanical complication of internal left hip prosthesis, subsequent encounter: Secondary | ICD-10-CM

## 2017-07-10 NOTE — Progress Notes (Signed)
Post-Op Visit Note   Patient: Angela [name]           Date of Birth: [birth year]           MRN: [id number] Visit Date: 07/10/2017 PCP: Sigmund Hazel, MD   Assessment & Plan: Patient returns she is taking care of her grandchildren when another grandchild had thoracic scoliosis spine fusion surgery [facility].  Children are active she is walking a lot she started having increased left groin pain without limping which is persisted.  Prior to that from her surgery 05/06/2017 she states she had been doing great without pain.  She returns today for repeat x-rays.  No erythema no drainage she has not noticed any difference in her leg lengths.  She discontinued her cane and then started having increased pain with limping at the end of the day.  X-rays obtained today shows a femoral stem has subsided with loss of the calcar and collar is down to the lesser trochanter.  She will get back on her walker and be only 50% weightbearing for a month.  Return in 1 month repeat x-rays.  Femoral stem does not appear undersized.  She had been extremely active particularly with her grandchildren and this likely contributed to the subsidence.  I discussed with her that usually with time and limited weightbearing stem will become stable and asymptomatic.   Chief Complaint:  Chief Complaint  Patient presents with  . Left Hip - Pain   Visit Diagnoses:  1. History of total hip arthroplasty, left   2. Femoral stem subsidence with osteoporosis.   Plan: Resume walker 50% weightbearing maximum x1 month repeat x-rays 1 month.  We reviewed the x-rays which showed the femoral stem subsidence.  We looked at Intra-Op images under fluoroscopy.  Patient had intact calcar and had been ambulating in the community without problems but likely has had some fragmentation of the calcar with gradual subsidence of the prosthesis.  Serial x-rays were reviewed with patient and her husband.  We also discussed potential if it continues  to subside and revision surgery of the femoral stem may be needed using a larger stem and leaving it proud.  She has not had any instability problems with the slight shortening that is present.  Follow-Up Instructions: No follow-ups on file.   Orders:  Orders Placed This Encounter  Procedures  . XR HIP UNILAT W OR W/O PELVIS 2-3 VIEWS LEFT   No orders of the defined types were placed in this encounter.   Imaging: No results found.  PMFS History: Patient Active Problem List   Diagnosis Date Noted  . Closed left hip fracture, initial encounter (HCC) 05/05/2017  . Polymyalgia (HCC) 07/08/2013   Past Medical History:  Diagnosis Date  . Carpal tunnel syndrome on right 10/08  . Elevated cholesterol 5/93  . Nephrolithiasis 2010  . Osteoporosis   . Polymyalgia (HCC) 2/97    Family History  Problem Relation Age of Onset  . Hypertension Mother   . Thyroid disease Mother   . Stroke Father     Past Surgical History:  Procedure Laterality Date  . BLEPHAROPLASTY Bilateral 2/14   Dr. Shawna Orleans  . CARPAL TUNNEL RELEASE Right 6/08  . CERVICAL DISCECTOMY  12/91  . KNEE SURGERY Left   . spots removed     frozen and biopsied-all benign  . TONSILLECTOMY AND ADENOIDECTOMY    . TOTAL HIP ARTHROPLASTY Left 05/06/2017   Procedure: TOTAL ANTERIOR HIP ARTHROPLASTY -LEFT;  Surgeon: Eldred Manges, MD;  Location: [facility] OR;  Service: Orthopedics;  Laterality: Left;   Social History   Occupational History  . Not on file  Tobacco Use  . Smoking status: Never Smoker  . Smokeless tobacco: Never Used  Substance and Sexual Activity  . Alcohol use: Yes    Alcohol/week: 0.6 oz    Types: 1 Standard drinks or equivalent per week    Comment: 2 Drinks/month  . Drug use: No  . Sexual activity: Never    Partners: Male    Birth control/protection: Other-see comments    Comment: spouse with vasectomy

## 2017-07-14 ENCOUNTER — Telehealth (INDEPENDENT_AMBULATORY_CARE_PROVIDER_SITE_OTHER): Payer: Self-pay | Admitting: Orthopaedic Surgery

## 2017-07-14 NOTE — Telephone Encounter (Signed)
Please advise 

## 2017-07-14 NOTE — Telephone Encounter (Signed)
Surgery  05/06/17  Total Anterior Hip Arthroplasty Left Hip    Pt called would like to know if she can do scissor kicks post surgery

## 2017-07-14 NOTE — Telephone Encounter (Signed)
Rest it . Has some stem settling and should be less active for it to ingrow and get stable.

## 2017-07-15 NOTE — Telephone Encounter (Signed)
I called patient and advised. 

## 2017-08-04 DIAGNOSIS — Z85828 Personal history of other malignant neoplasm of skin: Secondary | ICD-10-CM | POA: Diagnosis not present

## 2017-08-04 DIAGNOSIS — L309 Dermatitis, unspecified: Secondary | ICD-10-CM | POA: Diagnosis not present

## 2017-08-04 DIAGNOSIS — L821 Other seborrheic keratosis: Secondary | ICD-10-CM | POA: Diagnosis not present

## 2017-08-04 DIAGNOSIS — D1800 Hemangioma unspecified site: Secondary | ICD-10-CM | POA: Diagnosis not present

## 2017-08-04 DIAGNOSIS — L570 Actinic keratosis: Secondary | ICD-10-CM | POA: Diagnosis not present

## 2017-08-04 DIAGNOSIS — L814 Other melanin hyperpigmentation: Secondary | ICD-10-CM | POA: Diagnosis not present

## 2017-08-04 DIAGNOSIS — D225 Melanocytic nevi of trunk: Secondary | ICD-10-CM | POA: Diagnosis not present

## 2017-08-04 DIAGNOSIS — L723 Sebaceous cyst: Secondary | ICD-10-CM | POA: Diagnosis not present

## 2017-08-05 DIAGNOSIS — Z6823 Body mass index (BMI) 23.0-23.9, adult: Secondary | ICD-10-CM | POA: Diagnosis not present

## 2017-08-05 DIAGNOSIS — Z0000 Encounter for general adult medical examination without abnormal findings: Secondary | ICD-10-CM | POA: Diagnosis not present

## 2017-08-05 DIAGNOSIS — S72002A Fracture of unspecified part of neck of left femur, initial encounter for closed fracture: Secondary | ICD-10-CM | POA: Diagnosis not present

## 2017-08-05 DIAGNOSIS — H9193 Unspecified hearing loss, bilateral: Secondary | ICD-10-CM | POA: Diagnosis not present

## 2017-08-05 DIAGNOSIS — M810 Age-related osteoporosis without current pathological fracture: Secondary | ICD-10-CM | POA: Diagnosis not present

## 2017-08-05 DIAGNOSIS — Z1389 Encounter for screening for other disorder: Secondary | ICD-10-CM | POA: Diagnosis not present

## 2017-08-12 ENCOUNTER — Ambulatory Visit (INDEPENDENT_AMBULATORY_CARE_PROVIDER_SITE_OTHER): Payer: Medicare Other | Admitting: Orthopaedic Surgery

## 2017-08-12 ENCOUNTER — Encounter (INDEPENDENT_AMBULATORY_CARE_PROVIDER_SITE_OTHER): Payer: Self-pay | Admitting: Orthopaedic Surgery

## 2017-08-12 ENCOUNTER — Ambulatory Visit (INDEPENDENT_AMBULATORY_CARE_PROVIDER_SITE_OTHER): Payer: Medicare Other

## 2017-08-12 VITALS — BP 124/53 | HR 66 | Ht 66.0 in | Wt 140.0 lb

## 2017-08-12 DIAGNOSIS — Z96642 Presence of left artificial hip joint: Secondary | ICD-10-CM

## 2017-08-12 NOTE — Progress Notes (Signed)
Office Visit Note   Patient: Angela [name]           Date of Birth: 08/15/1940           MRN: [id number] Visit Date: 08/12/2017              Requested by: Sigmund Hazel, MD [address] [place], [place] [address] PCP: Sigmund Hazel, MD   Assessment & Plan: Visit Diagnoses:  1. History of total hip arthroplasty, left     Plan: Patient will 50% weightbearing gradual increase weightbearing working away after 2 weeks onto a cane used in the opposite right hand.  She Artie has a cane at home.  I will recheck her in 1 month for AP frog-leg x-ray of her hip and hopeful releasing her for full weightbearing and normal workout activity as tolerated.  Follow-Up Instructions: Return in about 1 month (around 09/12/2017).   Orders:  Orders Placed This Encounter  Procedures  . XR HIP UNILAT W OR W/O PELVIS 2-3 VIEWS LEFT   No orders of the defined types were placed in this encounter.     Procedures: No procedures performed   Clinical Data: No additional findings.   Subjective: Chief Complaint  Patient presents with  . Left Hip - Follow-up    Post THA 05/06/17    HPI 77 year old female returns post total hip arthroplasty 05/06/2017.  She had increased pain in her hip and x-rays 1 month postop showed that the femoral prosthesis is subsided.  She had been community ambulating and then had gradual increased pain.  She is being on a walker which touchdown weightbearing only.  In reality she is probably been 50% partial weightbearing.  She denies pain in her hip sometimes it feels slightly full to her. Review of Systems reviewed updated unchanged since surgery in April 2019 other than as mentioned HPI.   Objective: Vital Signs: BP (!) 124/53   Pulse 66   Ht 5\' 6"  (1.676 m)   Wt 140 lb (63.5 kg)   LMP 01/13/1993   BMI 22.60 kg/m   Physical Exam  Constitutional: She is oriented to person, place, and time. She appears well-developed.  HENT:  Head: Normocephalic.  Right Ear:  External ear normal.  Left Ear: External ear normal.  Eyes: Pupils are equal, round, and reactive to light.  Neck: No tracheal deviation present. No thyromegaly present.  Cardiovascular: Normal rate.  Pulmonary/Chest: Effort normal.  Abdominal: Soft.  Neurological: She is alert and oriented to person, place, and time.  Skin: Skin is warm and dry.  Psychiatric: She has a normal mood and affect. Her behavior is normal.    Ortho Exam incision is well-healed.  Slight firmness in the area adjacent to the incision as expected.  Quad strength is good distal pulses are normal.  She is able to ambulate in the office with negative Trendelenburg gait and without thigh pain or groin pain.  Specialty Comments:  No specialty comments available.  Imaging: Xr Hip Unilat W Or W/o Pelvis 2-3 Views Left  Result Date: 08/12/2017 AP pelvis frog-leg left hip obtained and reviewed.  This shows no further subsidence of the femoral prosthesis in comparison to previous x-rays.  The calcar is just above the lesser trochanter. Impression: Postop total hip arthroplasty.  Comparison x-rays from immediately postop and last office visit on 05/20/2017 shows no further subsidence of the femoral prosthesis.    PMFS History: Patient Active Problem List   Diagnosis Date Noted  . 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 History of total hip arthroplasty, left 07/10/2017  . Closed left hip fracture, initial encounter (HCC) 05/05/2017  . Polymyalgia (HCC) 07/08/2013   Past Medical History:  Diagnosis Date  . Carpal tunnel syndrome on right 10/08  . Elevated cholesterol 5/93  . Nephrolithiasis 2010  . Osteoporosis   . Polymyalgia (HCC) 2/97    Family History  Problem Relation Age of Onset  . Hypertension Mother   . Thyroid disease Mother   . Stroke Father     Past Surgical History:  Procedure Laterality Date  . BLEPHAROPLASTY Bilateral 2/14   Dr. Shawna Orleans  . CARPAL TUNNEL RELEASE  Right 6/08  . CERVICAL DISCECTOMY  12/91  . KNEE SURGERY Left   . spots removed     frozen and biopsied-all benign  . TONSILLECTOMY AND ADENOIDECTOMY    . TOTAL HIP ARTHROPLASTY Left 05/06/2017   Procedure: TOTAL ANTERIOR HIP ARTHROPLASTY -LEFT;  Surgeon: Eldred Manges, MD;  Location: [facility] OR;  Service: Orthopedics;  Laterality: Left;   Social History   Occupational History  . Not on file  Tobacco Use  . Smoking status: Never Smoker  . Smokeless tobacco: Never Used  Substance and Sexual Activity  . Alcohol use: Yes    Alcohol/week: 0.6 oz    Types: 1 Standard drinks or equivalent per week    Comment: 2 Drinks/month  . Drug use: No  . Sexual activity: Never    Partners: Male    Birth control/protection: Other-see comments    Comment: spouse with vasectomy

## 2017-08-14 DIAGNOSIS — M810 Age-related osteoporosis without current pathological fracture: Secondary | ICD-10-CM | POA: Diagnosis not present

## 2017-09-09 ENCOUNTER — Ambulatory Visit (INDEPENDENT_AMBULATORY_CARE_PROVIDER_SITE_OTHER): Payer: Medicare Other

## 2017-09-09 ENCOUNTER — Ambulatory Visit (INDEPENDENT_AMBULATORY_CARE_PROVIDER_SITE_OTHER): Payer: Medicare Other | Admitting: Orthopaedic Surgery

## 2017-09-09 DIAGNOSIS — Z96642 Presence of left artificial hip joint: Secondary | ICD-10-CM

## 2017-09-09 NOTE — Progress Notes (Signed)
Office Visit Note   Patient: Angela [name]           Date of Birth: 04-05-1940           MRN: [id number] Visit Date: 09/09/2017              Requested by: Sigmund Hazel, MD [address] [place], [place] [address] PCP: Sigmund Hazel, MD   Assessment & Plan: Visit Diagnoses:  1. History of total hip arthroplasty, left         Femoral stem subsidence post op after fall .    Plan: Recheck 6 months.  Single standing AP pelvis x-ray on return.  She now has a stable stem has no pain and should do well.  She can stop using her cane.  Follow-Up Instructions: No follow-ups on file.   Orders:  Orders Placed This Encounter  Procedures  . XR HIP UNILAT W OR W/O PELVIS 2-3 VIEWS LEFT   No orders of the defined types were placed in this encounter.     Procedures: No procedures performed   Clinical Data: No additional findings.   Subjective: Chief Complaint  Patient presents with  . Left Hip - Follow-up    HPI 77 year old female returns post left total hip arthroplasty 05/06/17 for displaced femoral neck fracture.  Postoperatively she had a fall and had some femoral stem subsidence losing the 9 mm calcar with the collar now sitting on the lesser trochanter.  She had been limited weightbearing states she is not having any discomfort at this point.  She does not notice any leg length inequality.  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  She is able to walk in the office without pain today.  Review of Systems reviewed updated unchanged from surgery in April.   Objective: Vital Signs: LMP 01/13/1993   Physical Exam  Constitutional: She is oriented to person, place, and time. She appears well-developed.  HENT:  Head: Normocephalic.  Right Ear: External ear normal.  Left Ear: External ear normal.  Eyes: Pupils are equal, round, and reactive to light.  Neck: No tracheal deviation present. No thyromegaly present.   Cardiovascular: Normal rate.  Pulmonary/Chest: Effort normal.  Abdominal: Soft.  Neurological: She is alert and oriented to person, place, and time.  Skin: Skin is warm and dry.  Psychiatric: She has a normal mood and affect. Her behavior is normal.    Ortho Exam hip incisions well-healed she is able ambulate without limping.  She has trace pelvic obliquity in sitting position she appears to be a few millimeters short on the left femur from the femoral stem subsidence.  I gave her a 3/16 inch Hapad that she can put in her left shoe she so desires.  Currently she does not notice any inequality in her leg lengths.  I plan to check her back again in 6 months.  X-ray results were reviewed.  Specialty Comments:  No specialty comments available.  Imaging: Xr Hip Unilat W Or W/o Pelvis 2-3 Views Left  Result Date: 09/09/2017 Standing AP frog-leg lateral x-rays obtained and reviewed.  This shows left total hip arthroplasty with no further subsidence of the stem with calcar on the lesser trochanter.  No lucency around the stem noted.  Comparison to previous x-rays last 2 months are unchanged. Impression: Post left total hip arthroplasty for femoral neck fracture with some femoral stem subsidence now stable.    PMFS History:  Patient Active Problem List   Diagnosis Date Noted  . 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 History of total hip arthroplasty, left 07/10/2017  . Closed left hip fracture, initial encounter (HCC) 05/05/2017  . Polymyalgia (HCC) 07/08/2013   Past Medical History:  Diagnosis Date  . Carpal tunnel syndrome on right 10/08  . Elevated cholesterol 5/93  . Nephrolithiasis 2010  . Osteoporosis   . Polymyalgia (HCC) 2/97    Family History  Problem Relation Age of Onset  . Hypertension Mother   . Thyroid disease Mother   . Stroke Father     Past Surgical History:  Procedure Laterality Date  . BLEPHAROPLASTY Bilateral 2/14   Dr. Shawna Orleans   . CARPAL TUNNEL RELEASE Right 6/08  . CERVICAL DISCECTOMY  12/91  . KNEE SURGERY Left   . spots removed     frozen and biopsied-all benign  . TONSILLECTOMY AND ADENOIDECTOMY    . TOTAL HIP ARTHROPLASTY Left 05/06/2017   Procedure: TOTAL ANTERIOR HIP ARTHROPLASTY -LEFT;  Surgeon: Eldred Manges, MD;  Location: [facility] OR;  Service: Orthopedics;  Laterality: Left;   Social History   Occupational History  . Not on file  Tobacco Use  . Smoking status: Never Smoker  . Smokeless tobacco: Never Used  Substance and Sexual Activity  . Alcohol use: Yes    Alcohol/week: 1.0 standard drinks    Types: 1 Standard drinks or equivalent per week    Comment: 2 Drinks/month  . Drug use: No  . Sexual activity: Never    Partners: Male    Birth control/protection: Other-see comments    Comment: spouse with vasectomy

## 2017-09-15 DIAGNOSIS — Z8619 Personal history of other infectious and parasitic diseases: Secondary | ICD-10-CM | POA: Diagnosis not present

## 2017-09-15 DIAGNOSIS — R0781 Pleurodynia: Secondary | ICD-10-CM | POA: Diagnosis not present

## 2017-09-22 ENCOUNTER — Other Ambulatory Visit: Payer: Self-pay | Admitting: Family Medicine

## 2017-09-22 DIAGNOSIS — R911 Solitary pulmonary nodule: Secondary | ICD-10-CM

## 2017-09-25 ENCOUNTER — Ambulatory Visit
Admission: RE | Admit: 2017-09-25 | Discharge: 2017-09-25 | Disposition: A | Discharge status: Home / Self Care | Payer: Medicare Other | Source: Ambulatory Visit | Attending: Family Medicine | Admitting: Family Medicine

## 2017-09-25 DIAGNOSIS — B349 Viral infection, unspecified: Secondary | ICD-10-CM | POA: Diagnosis not present

## 2017-09-25 DIAGNOSIS — R918 Other nonspecific abnormal finding of lung field: Secondary | ICD-10-CM | POA: Diagnosis not present

## 2017-09-25 DIAGNOSIS — R911 Solitary pulmonary nodule: Secondary | ICD-10-CM

## 2017-09-25 IMAGING — CT CT CHEST W/O CM
1 series · 14 of 34 positions shown, 18 images · non-contrast
Comparison: Chest x-ray dated [DATE].

CLINICAL DATA: Follow-up nodule RIGHT mid lung. Pleurisy for 7
days. LEFT lower lobe granuloma. History of histoplasmosis.

EXAM:
CT CHEST WITHOUT CONTRAST
TECHNIQUE: Multidetector CT imaging of the chest was performed following the
standard protocol without IV contrast.

[Series 2: chest w/(date) · axial · 0.73mm/px · z∈[-337,-63]mm · 14 of 161 slices shown, 18 images]
[im 12/161  mediastinal]
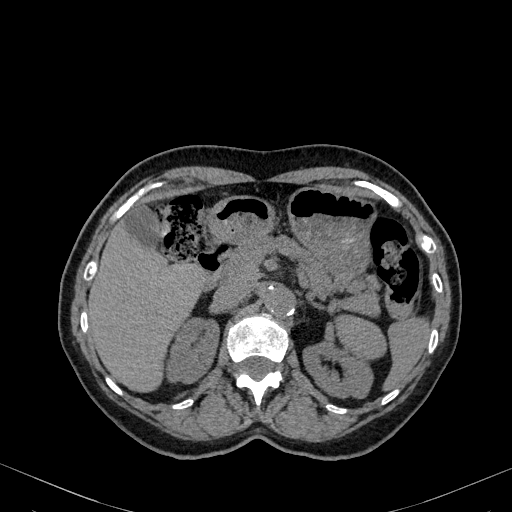
[im 12/161  lung]
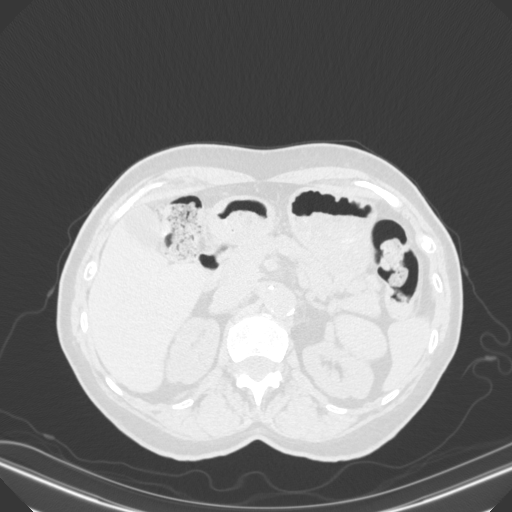
[im 24/161  lung]
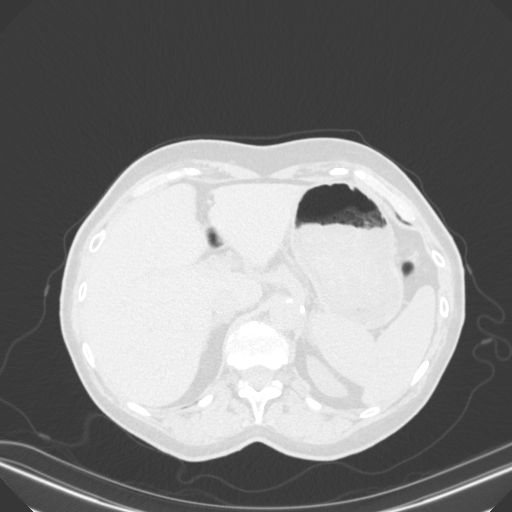
[im 33/161  lung]
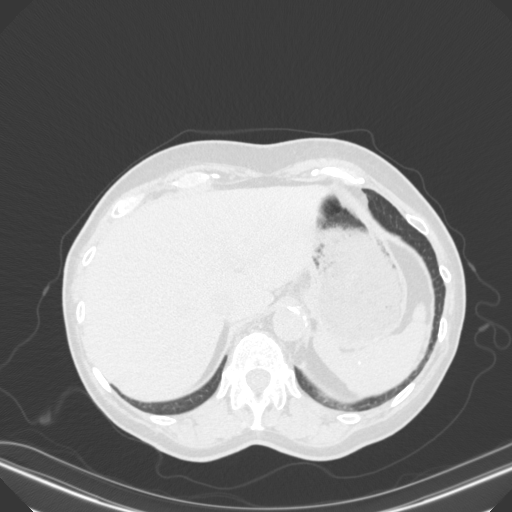
[im 48/161  lung]
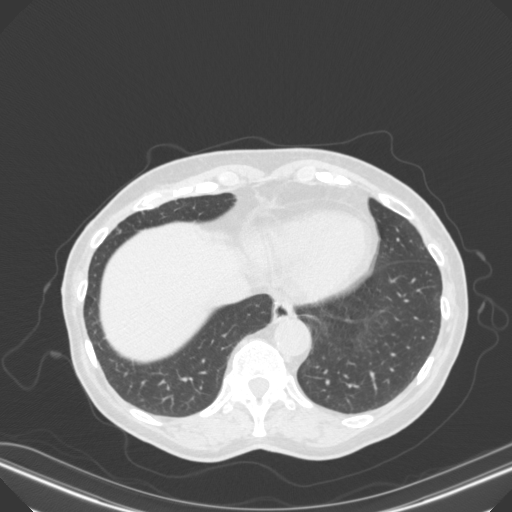
[im 60/161  mediastinal]
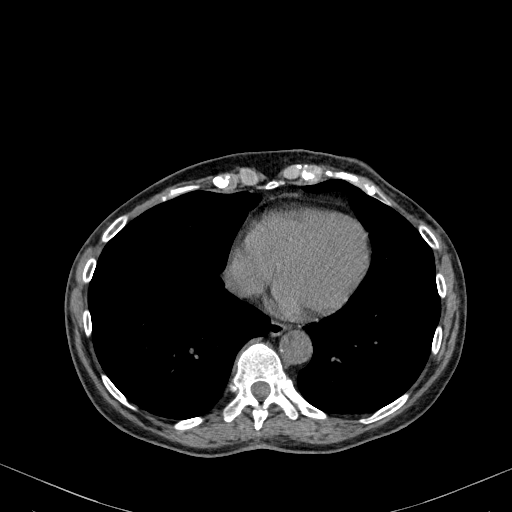
[im 60/161  lung]
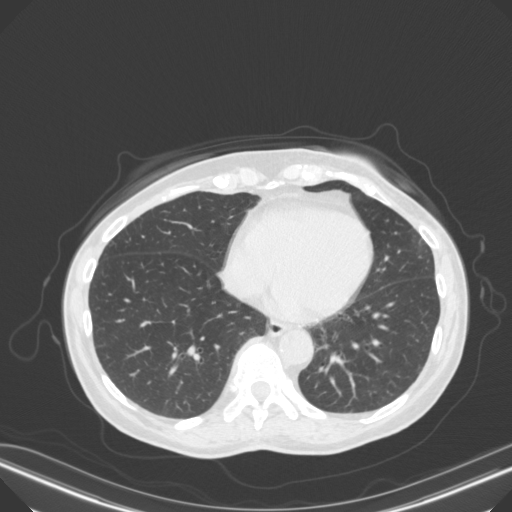
[im 66/161  lung]
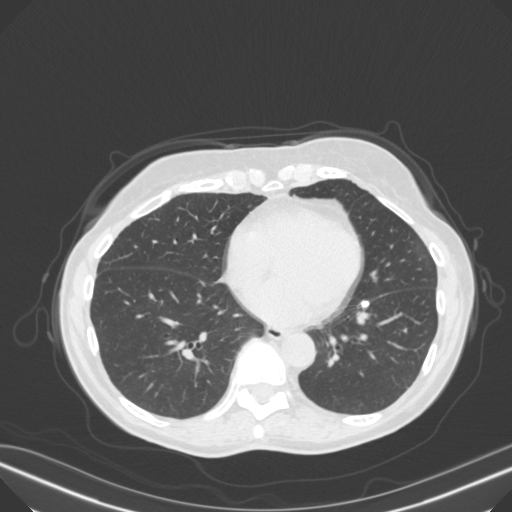
[im 77/161  lung]
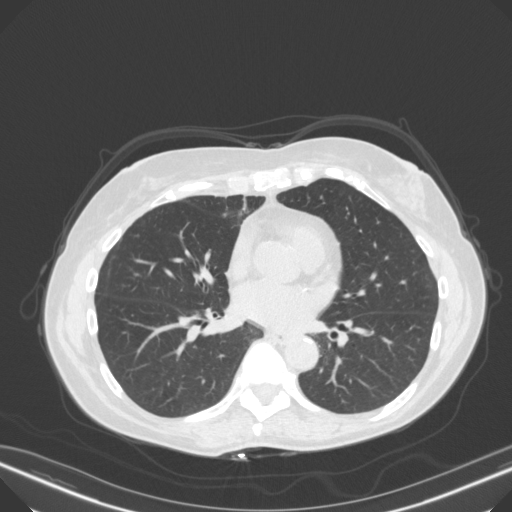
[im 86/161  lung]
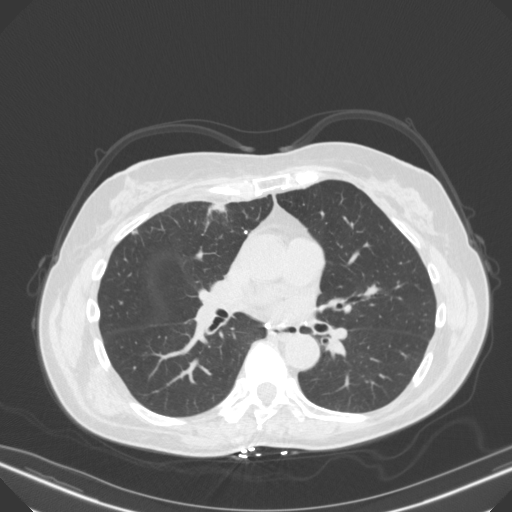
[im 95/161  mediastinal]
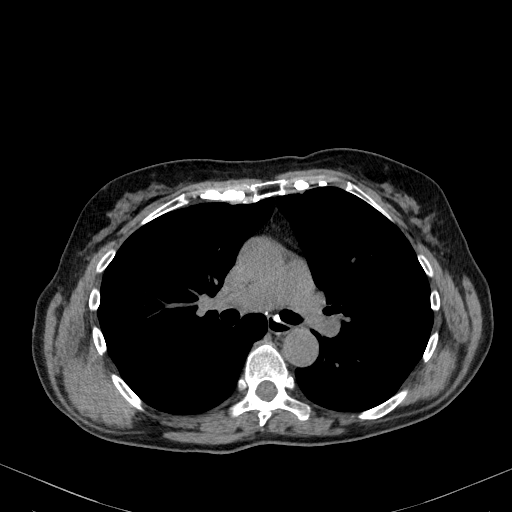
[im 95/161  lung]
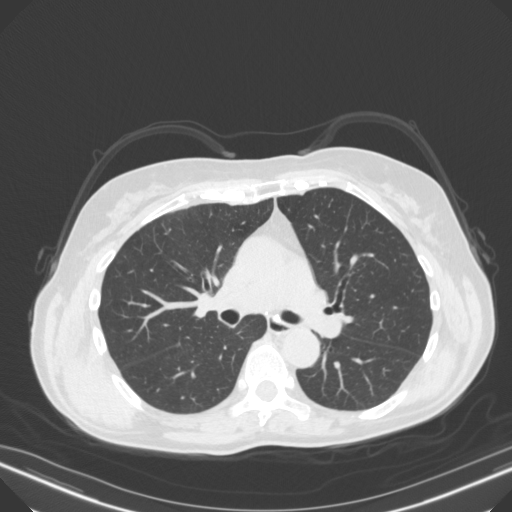
[im 101/161  lung]
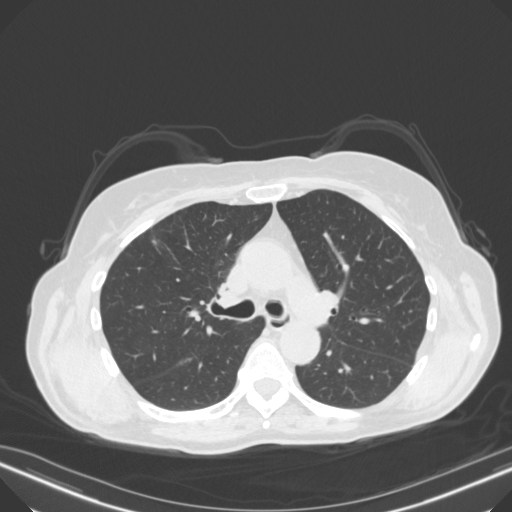
[im 119/161  lung]
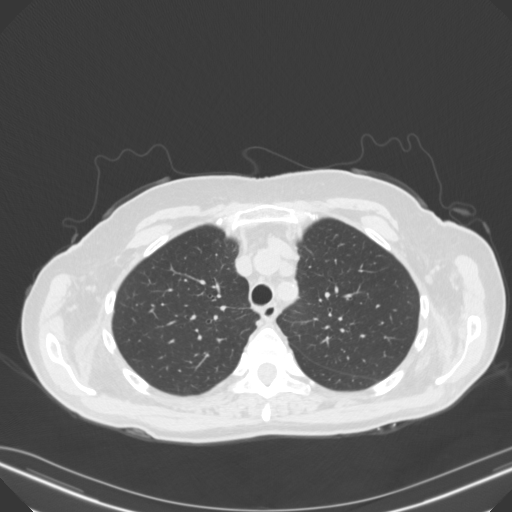
[im 129/161  lung]
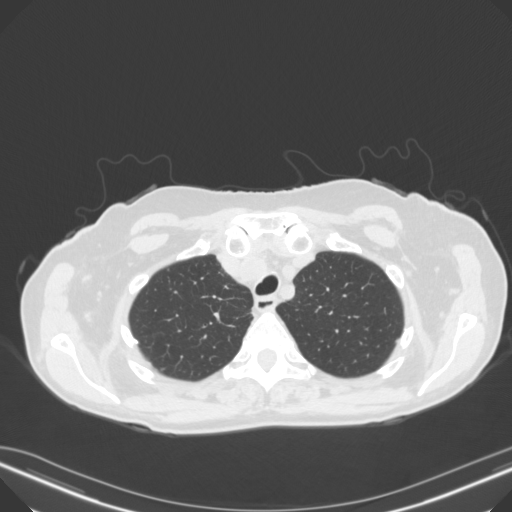
[im 137/161  mediastinal]
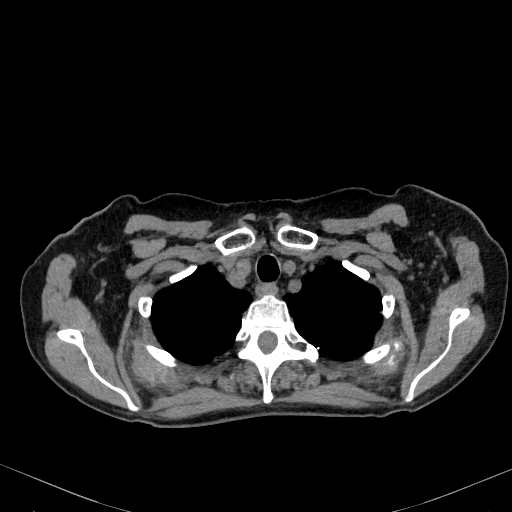
[im 137/161  lung]
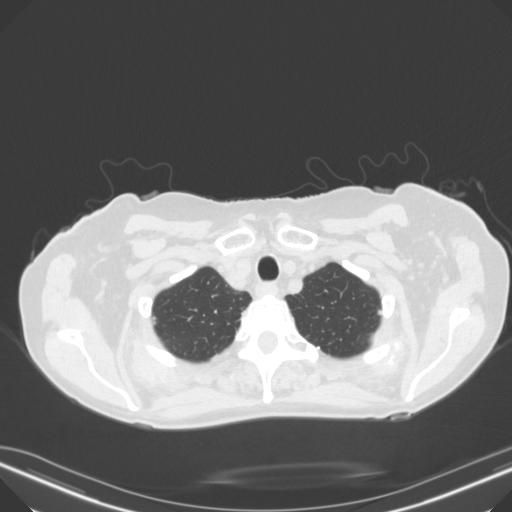
[im 149/161  lung]
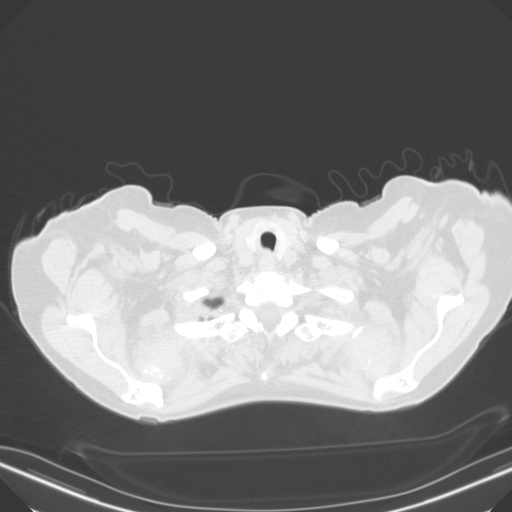

[14 of 34 positions shown; findings below may reference images not displayed]

FINDINGS: Cardiovascular: Heart size is normal. Coronary artery
calcifications, particularly dense within the LEFT main and LEFT
anterior descending coronary arteries. No pericardial effusion. No
thoracic aortic aneurysm. Scattered aortic atherosclerosis.

Mediastinum/Nodes: No mass or suspicious lymphadenopathy within the
mediastinum or perihilar regions. Calcified lymph nodes within the
mediastinum and LEFT hilum, compatible with the given history of
histoplasmosis.

Esophagus appears normal.  Trachea appears normal.

Lungs/Pleura: 4 mm pulmonary nodule within the anterolateral aspects
of the RIGHT upper lobe, likely corresponding to the chest x-ray
finding (series 5, image 60).

Additional 4 mm pleural based nodule within the inferior portion of
the RIGHT upper lobe, also anterolateral aspect (series 5, image
74).

Focal slightly irregular pleural thickening within the inferior
segment of the RIGHT upper lobe (series 5, image 79). Probable
adjacent chronic nodular scarring/atelectasis within the
anterior-inferior aspects of the RIGHT upper lobe (series 5, images
71 through 77.

Two benign calcified granulomas within the LEFT lower lobe. No
noncalcified pulmonary nodules within the LEFT lung.

No pleural effusion or pneumothorax. No evidence of consolidating
pneumonia.

Upper Abdomen: Limited images of the upper abdomen are unremarkable.

Musculoskeletal: No acute or suspicious osseous finding.
IMPRESSION: 1. Small nonspecific pulmonary nodules within the RIGHT lung, both
measuring 4 mm with 1 likely corresponding to the recent chest x-ray
finding, as well as chronic appearing nodular scarring/atelectasis
along the pleura of the RIGHT upper lobe. Recommend follow-up
noncontrast chest CT in 12 months to ensure stability of all
findings. This recommendation follows the consensus statement:
Guidelines for Management of Incidental Pulmonary Nodules Detected
[DATE].
2. Coronary artery calcifications, particularly dense within the
LEFT main and LEFT anterior descending coronary arteries. Recommend
correlation with any possible associated cardiac symptoms.

Aortic Atherosclerosis ([ZL]-[ZL]).

## 2017-09-29 DIAGNOSIS — I2510 Atherosclerotic heart disease of native coronary artery without angina pectoris: Secondary | ICD-10-CM | POA: Diagnosis not present

## 2017-09-29 DIAGNOSIS — Z8619 Personal history of other infectious and parasitic diseases: Secondary | ICD-10-CM | POA: Diagnosis not present

## 2017-09-29 DIAGNOSIS — R05 Cough: Secondary | ICD-10-CM | POA: Diagnosis not present

## 2017-09-29 DIAGNOSIS — R062 Wheezing: Secondary | ICD-10-CM | POA: Diagnosis not present

## 2017-09-29 DIAGNOSIS — R0781 Pleurodynia: Secondary | ICD-10-CM | POA: Diagnosis not present

## 2017-09-30 ENCOUNTER — Ambulatory Visit (INDEPENDENT_AMBULATORY_CARE_PROVIDER_SITE_OTHER): Payer: Medicare Other | Admitting: Orthopaedic Surgery

## 2017-09-30 DIAGNOSIS — R05 Cough: Secondary | ICD-10-CM | POA: Diagnosis not present

## 2017-09-30 DIAGNOSIS — R0781 Pleurodynia: Secondary | ICD-10-CM | POA: Diagnosis not present

## 2017-09-30 DIAGNOSIS — Z8619 Personal history of other infectious and parasitic diseases: Secondary | ICD-10-CM | POA: Diagnosis not present

## 2017-09-30 DIAGNOSIS — I2510 Atherosclerotic heart disease of native coronary artery without angina pectoris: Secondary | ICD-10-CM | POA: Diagnosis not present

## 2017-09-30 DIAGNOSIS — R062 Wheezing: Secondary | ICD-10-CM | POA: Diagnosis not present

## 2017-10-05 ENCOUNTER — Telehealth (INDEPENDENT_AMBULATORY_CARE_PROVIDER_SITE_OTHER): Payer: Self-pay | Admitting: Orthopaedic Surgery

## 2017-10-05 NOTE — Telephone Encounter (Signed)
I called discussed.she walked one half mile and then had some pain. She will get back on her walker for 2 or 3 days then use the cane again for one week and let me know. If continued pain will need larger stem and longer neck due to stem loosening and subsidence. She will call us in a couple weeks. FYI

## 2017-10-05 NOTE — Telephone Encounter (Signed)
Patient called and requested that you call you this evening.  CB#[phone].  Thank you.

## 2017-10-05 NOTE — Telephone Encounter (Signed)
Please advise. Patient would like to speak with you.

## 2017-10-06 NOTE — Telephone Encounter (Signed)
noted 

## 2017-10-09 DIAGNOSIS — I10 Essential (primary) hypertension: Secondary | ICD-10-CM | POA: Diagnosis not present

## 2017-10-09 DIAGNOSIS — I2510 Atherosclerotic heart disease of native coronary artery without angina pectoris: Secondary | ICD-10-CM | POA: Diagnosis not present

## 2017-10-09 DIAGNOSIS — E782 Mixed hyperlipidemia: Secondary | ICD-10-CM | POA: Diagnosis not present

## 2017-10-12 DIAGNOSIS — I2510 Atherosclerotic heart disease of native coronary artery without angina pectoris: Secondary | ICD-10-CM | POA: Diagnosis not present

## 2017-10-23 DIAGNOSIS — Z23 Encounter for immunization: Secondary | ICD-10-CM | POA: Diagnosis not present

## 2017-10-28 DIAGNOSIS — E782 Mixed hyperlipidemia: Secondary | ICD-10-CM | POA: Diagnosis not present

## 2017-10-28 DIAGNOSIS — I2510 Atherosclerotic heart disease of native coronary artery without angina pectoris: Secondary | ICD-10-CM | POA: Diagnosis not present

## 2017-10-28 DIAGNOSIS — I10 Essential (primary) hypertension: Secondary | ICD-10-CM | POA: Diagnosis not present

## 2017-11-10 DIAGNOSIS — I2510 Atherosclerotic heart disease of native coronary artery without angina pectoris: Secondary | ICD-10-CM | POA: Diagnosis not present

## 2017-11-10 DIAGNOSIS — R05 Cough: Secondary | ICD-10-CM | POA: Diagnosis not present

## 2017-11-10 DIAGNOSIS — Z8619 Personal history of other infectious and parasitic diseases: Secondary | ICD-10-CM | POA: Diagnosis not present

## 2017-11-10 DIAGNOSIS — R062 Wheezing: Secondary | ICD-10-CM | POA: Diagnosis not present

## 2017-11-10 DIAGNOSIS — E7800 Pure hypercholesterolemia, unspecified: Secondary | ICD-10-CM | POA: Diagnosis not present

## 2017-11-10 DIAGNOSIS — R0781 Pleurodynia: Secondary | ICD-10-CM | POA: Diagnosis not present

## 2017-11-12 ENCOUNTER — Telehealth (INDEPENDENT_AMBULATORY_CARE_PROVIDER_SITE_OTHER): Payer: Self-pay | Admitting: Orthopaedic Surgery

## 2017-11-12 NOTE — Telephone Encounter (Signed)
Please advise 

## 2017-11-12 NOTE — Telephone Encounter (Signed)
I called and spoke with patient. She was diagnosed with pleurisy after last visit.  She was given a chest xray which showed a nodule and then had a CT Chest which showed the nodule was ok but that she had some blockages. She then had a Nuc Med stress test, which showed the blockages were not major, but she had to see a cardiologist. The cardiologist has put her on Crestor 10mg  and Aspirin 81mg  daily.  I offered patient work in appointment at 3:15p on 11/17/17. I did advise that she may have to wait, as it is a work in appointment. She expressed understanding.

## 2017-11-12 NOTE — Telephone Encounter (Signed)
Patient called advised she had to go back on the walker last Friday. Patient said she started to hurt again. Patient asked what should she do now. Patient said the pain is in the groin area. Patient said she is trying to avoid surgery. The number to contact patient is [phone]

## 2017-11-12 NOTE — Telephone Encounter (Signed)
Needs ROV and new xrays  thanks

## 2017-11-12 NOTE — Telephone Encounter (Signed)
Please work patient in to 3:15p time slot on 11/17/17. She has had recent THA with possible hardware loosening and increasing hip pain.  Thanks.

## 2017-11-13 NOTE — Telephone Encounter (Signed)
appt scheduled

## 2017-11-17 ENCOUNTER — Ambulatory Visit (INDEPENDENT_AMBULATORY_CARE_PROVIDER_SITE_OTHER): Payer: Medicare Other

## 2017-11-17 ENCOUNTER — Ambulatory Visit (INDEPENDENT_AMBULATORY_CARE_PROVIDER_SITE_OTHER): Payer: Medicare Other | Admitting: Orthopaedic Surgery

## 2017-11-17 ENCOUNTER — Encounter (INDEPENDENT_AMBULATORY_CARE_PROVIDER_SITE_OTHER): Payer: Self-pay | Admitting: Orthopaedic Surgery

## 2017-11-17 ENCOUNTER — Other Ambulatory Visit (INDEPENDENT_AMBULATORY_CARE_PROVIDER_SITE_OTHER): Payer: Self-pay

## 2017-11-17 VITALS — Ht 66.0 in | Wt 140.0 lb

## 2017-11-17 DIAGNOSIS — Z96642 Presence of left artificial hip joint: Secondary | ICD-10-CM | POA: Diagnosis not present

## 2017-11-18 DIAGNOSIS — Z23 Encounter for immunization: Secondary | ICD-10-CM | POA: Diagnosis not present

## 2017-11-18 DIAGNOSIS — L820 Inflamed seborrheic keratosis: Secondary | ICD-10-CM | POA: Diagnosis not present

## 2017-11-18 DIAGNOSIS — L570 Actinic keratosis: Secondary | ICD-10-CM | POA: Diagnosis not present

## 2017-11-18 DIAGNOSIS — D485 Neoplasm of uncertain behavior of skin: Secondary | ICD-10-CM | POA: Diagnosis not present

## 2017-11-20 ENCOUNTER — Encounter (INDEPENDENT_AMBULATORY_CARE_PROVIDER_SITE_OTHER): Payer: Self-pay | Admitting: Orthopaedic Surgery

## 2017-11-20 NOTE — Progress Notes (Signed)
Office Visit Note   Patient: Angela [name]           Date of Birth: 14-Sep-1940           MRN: [id number] Visit Date: 11/17/2017              Requested by: Sigmund Hazel, MD [address] [place], [place] [address] PCP: Sigmund Hazel, MD   Assessment & Plan: Visit Diagnoses:  1. S/P hip replacement, left     Plan: Post total hip arthroplasty for left femoral neck fracture.  Postoperatively a month after the surgery she is had subsidence of the femoral prosthesis with pain that resolved with use of a walker and now with recurrent pain again in her thigh consistent with likely loose femoral stem.  Reviewed her x-rays with similar partners including Dr. Magnus Ivan.  We will proceed with bone scan to evaluate her for potential stem loosening.  We discussed possibility of revision with placement of a larger stem to restore her leg length.  We discussed that the stem may be solid at this point and potentially might be very difficult to remove and she might require revision to a longer ball if the femoral stem was solid and ingrown.  Office follow-up after bone scan.  Follow-Up Instructions: No follow-ups on file.   Orders:  Orders Placed This Encounter  Procedures  . XR HIP UNILAT W OR W/O PELVIS 2-3 VIEWS LEFT   No orders of the defined types were placed in this encounter.     Procedures: No procedures performed   Clinical Data: No additional findings.   Subjective: Chief Complaint  Patient presents with  . Left Hip - Pain    HPI 77 year old female returns post left anterior total hip arthroplasty 05/06/2017 for femoral neck fracture.  In May on follow-up she had some subsidence of her femoral stem was placed on nonweightbearing for 6 weeks using a walker or only touchdown weightbearing had resolution of symptoms was back ambulating with a cane and then off a cane and then had recurrence of pain once again.  She is placed on limited weightbearing for couple weeks was doing much  better and then recently had recurrence of thigh pain.  She is back using her walker.  Review of Systems view of systems updated unchanged from previous office visits.   Objective: Vital Signs: Ht 5\' 6"  (1.676 m)   Wt 140 lb (63.5 kg)   LMP 01/13/1993   BMI 22.60 kg/m   Physical Exam  Constitutional: She is oriented to person, place, and time. She appears well-developed.  HENT:  Head: Normocephalic.  Right Ear: External ear normal.  Left Ear: External ear normal.  Eyes: Pupils are equal, round, and reactive to light.  Neck: No tracheal deviation present. No thyromegaly present.  Cardiovascular: Normal rate.  Pulmonary/Chest: Effort normal.  Abdominal: Soft.  Neurological: She is alert and oriented to person, place, and time.  Skin: Skin is warm and dry.  Psychiatric: She has a normal mood and affect. Her behavior is normal.    Ortho Exam patient is ambulatory with a walker.  On the left side due to shortening she is using a heel lift.  Specialty Comments:  No specialty comments available.  Imaging: No results found.   PMFS History: Patient Active Problem List   Diagnosis Date Noted  . 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 History of total hip arthroplasty, left 07/10/2017  . Closed left hip fracture, initial  encounter (HCC) 05/05/2017  . Polymyalgia (HCC) 07/08/2013   Past Medical History:  Diagnosis Date  . Carpal tunnel syndrome on right 10/08  . Elevated cholesterol 5/93  . Nephrolithiasis 2010  . Osteoporosis   . Polymyalgia (HCC) 2/97    Family History  Problem Relation Age of Onset  . Hypertension Mother   . Thyroid disease Mother   . Stroke Father     Past Surgical History:  Procedure Laterality Date  . BLEPHAROPLASTY Bilateral 2/14   Dr. Shawna Orleans  . CARPAL TUNNEL RELEASE Right 6/08  . CERVICAL DISCECTOMY  12/91  . KNEE SURGERY Left   . spots removed     frozen and biopsied-all benign  . TONSILLECTOMY AND  ADENOIDECTOMY    . TOTAL HIP ARTHROPLASTY Left 05/06/2017   Procedure: TOTAL ANTERIOR HIP ARTHROPLASTY -LEFT;  Surgeon: Eldred Manges, MD;  Location: [facility] OR;  Service: Orthopedics;  Laterality: Left;   Social History   Occupational History  . Not on file  Tobacco Use  . Smoking status: Never Smoker  . Smokeless tobacco: Never Used  Substance and Sexual Activity  . Alcohol use: Yes    Alcohol/week: 1.0 standard drinks    Types: 1 Standard drinks or equivalent per week    Comment: 2 Drinks/month  . Drug use: No  . Sexual activity: Never    Partners: Male    Birth control/protection: Other-see comments    Comment: spouse with vasectomy

## 2017-11-30 ENCOUNTER — Encounter (HOSPITAL_COMMUNITY)
Admission: RE | Admit: 2017-11-30 | Discharge: 2017-11-30 | Disposition: A | Discharge status: Home / Self Care | Payer: Medicare Other | Source: Ambulatory Visit | Attending: Orthopaedic Surgery | Admitting: Orthopaedic Surgery

## 2017-11-30 DIAGNOSIS — Z471 Aftercare following joint replacement surgery: Secondary | ICD-10-CM | POA: Diagnosis not present

## 2017-11-30 DIAGNOSIS — Z96652 Presence of left artificial knee joint: Secondary | ICD-10-CM | POA: Diagnosis not present

## 2017-11-30 DIAGNOSIS — Z96642 Presence of left artificial hip joint: Secondary | ICD-10-CM

## 2017-11-30 IMAGING — NM NM BONE 3 PHASE
10 series · 20 of 20 positions shown · non-contrast
Comparison: None

Radiographic correlation: LEFT hip radiographs [DATE]

CLINICAL DATA: Fell in [DATE], hip fracture, post hip
replacement surgery in [DATE], having LEFT hip pain question
aseptic loosening

EXAM:
NUCLEAR MEDICINE 3-PHASE BONE SCAN
TECHNIQUE: Radionuclide angiographic images, immediate static blood pool
images, and 3-hour delayed static images were obtained of the hips
after intravenous injection of radiopharmaceutical.
RADIOPHARMACEUTICALS:  21.6 mCi [OJ] MDP IV

[Series 1: flow · 2.07mm/px · 6 of 48 frames shown (1 of 2)]
[frame 5/48  full-range]
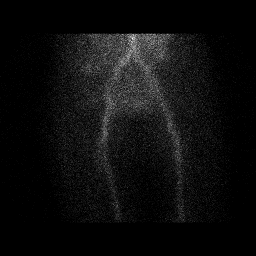
[frame 13/48  full-range]
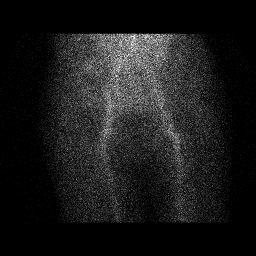
[frame 21/48  full-range]
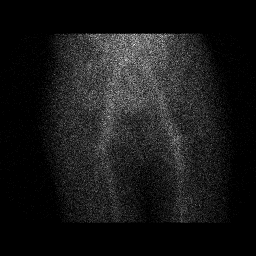
[frame 29/48  full-range]
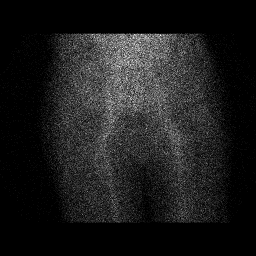
[frame 37/48  full-range]
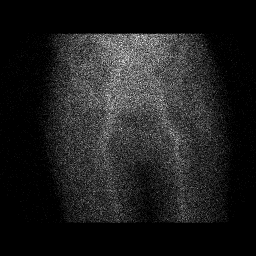
[frame 45/48  full-range]
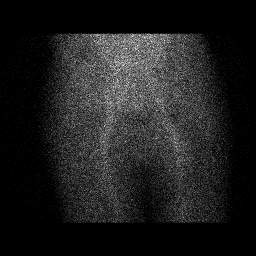

[Series 1: delay · delayed · 2.07mm/px · 1 of 1 slices shown (1 of 4)]
[im 1/1]
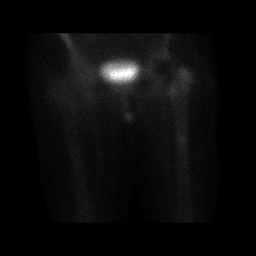

[Series 1: delay · delayed · 2.07mm/px · 1 of 1 slices shown (2 of 4)]
[im 1/1]
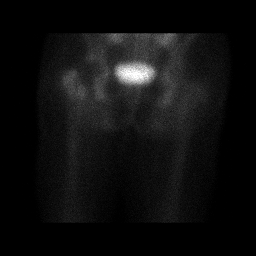

[Series 1: flow · 2.07mm/px · 6 of 48 frames shown (2 of 2)]
[frame 5/48  full-range]
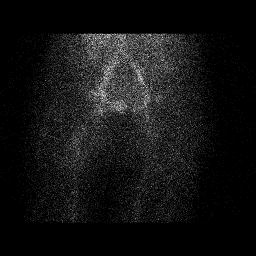
[frame 13/48  full-range]
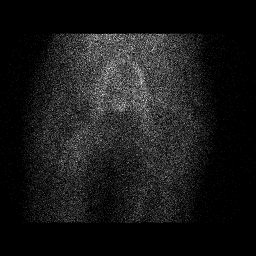
[frame 21/48  full-range]
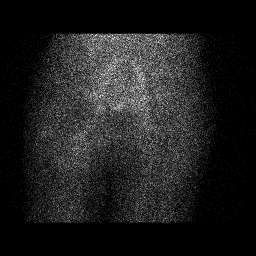
[frame 29/48  full-range]
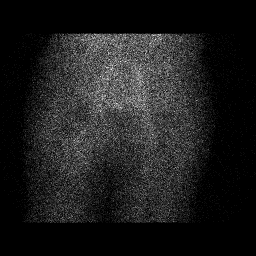
[frame 37/48  full-range]
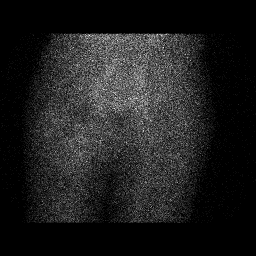
[frame 45/48  full-range]
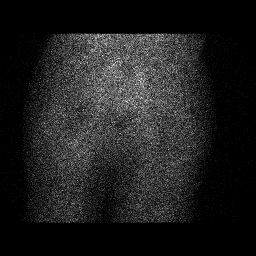

[Series 2: delay · delayed · 2.07mm/px · 1 of 1 slices shown (3 of 4)]
[im 1/1]
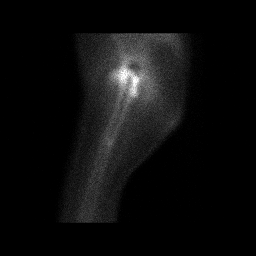

[Series 2: blood pool · 2.07mm/px · 1 of 1 slices shown (1 of 2)]
[im 1/1]
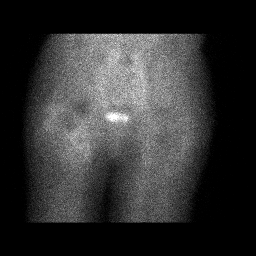

[Series 2: blood pool · 2.07mm/px · 1 of 1 slices shown (2 of 2)]
[im 1/1]
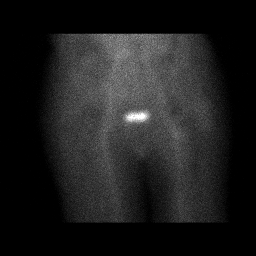

[Series 2: delay · delayed · 2.07mm/px · 1 of 1 slices shown (4 of 4)]
[im 1/1]
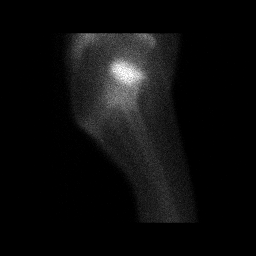

[Series 3: lat bp · 2.07mm/px · 1 of 1 slices shown (1 of 2)]
[im 1/1]
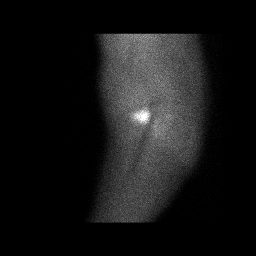

[Series 3: lat bp · 2.07mm/px · 1 of 1 slices shown (2 of 2)]
[im 1/1]
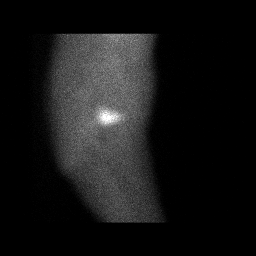

[20 of 20 positions shown; findings below may reference images not displayed]

FINDINGS: Vascular phase: Mildly increased blood flow to soft tissues
inferomedial to the LEFT hip posteriorly. Normal blood flow to the
RIGHT hip.

Blood pool phase: Increased blood pool to the soft tissues at the
posteromedial aspect of the LEFT hip region. Normal blood pool at
RIGHT hip and pelvis.

Delayed phase: Photopenic defect at LEFT hip from hip replacement
surgery. Minimally increased tracer accumulation adjacent to the
proximal and distal aspects of the femoral component of the LEFT
prosthesis consistent with expected postoperative change. No
additional focal or intense increased uptake is seen at the LEFT hip
to suggest loosening or infection. Normal uptake of tracer at the
RIGHT hip joint and at the visualized portions of the pelvis and
RIGHT femur.
IMPRESSION: Likely postoperative scintigraphic findings at the LEFT hip
prosthesis without definite scintigraphic evidence of loosening or
infection.

## 2017-11-30 MED ORDER — TECHNETIUM TC 99M MEDRONATE IV KIT
21.6000 | PACK | Freq: Once | INTRAVENOUS | Status: AC | PRN
  Administered 2017-11-30: 21.6 via INTRAVENOUS

## 2017-12-04 ENCOUNTER — Ambulatory Visit (INDEPENDENT_AMBULATORY_CARE_PROVIDER_SITE_OTHER): Payer: Medicare Other | Admitting: Orthopaedic Surgery

## 2017-12-04 ENCOUNTER — Encounter (INDEPENDENT_AMBULATORY_CARE_PROVIDER_SITE_OTHER): Payer: Self-pay | Admitting: Orthopaedic Surgery

## 2017-12-04 VITALS — BP 165/62 | HR 64 | Ht 66.0 in | Wt 140.0 lb

## 2017-12-04 DIAGNOSIS — Z96642 Presence of left artificial hip joint: Secondary | ICD-10-CM | POA: Diagnosis not present

## 2017-12-04 NOTE — Progress Notes (Signed)
Office Visit Note   Patient: Angela [name]           Date of Birth: [birth year]           MRN: [id number] Visit Date: 12/04/2017              Requested by: Sigmund Hazel, MD [address] [place], [place] [address] PCP: Sigmund Hazel, MD   Assessment & Plan: Visit Diagnoses:  1. History of total hip arthroplasty, left     Plan: Continue to have to use her walker.  She has osteo-porosis and started Prolia.  We reviewed x-rays again that showed after surgery in April that in July prosthesis had settled with loss of the femoral neck and subsidence down to the lesser trochanter.  Since July there is been no change all the way until November.  Scan shows no evidence of loosening or infection.  She will continue protective weightbearing and then gradually increase her activities at a very slow rate.  I will recheck in February.  Follow-Up Instructions: No follow-ups on file.   Orders:  No orders of the defined types were placed in this encounter.  No orders of the defined types were placed in this encounter.     Procedures: No procedures performed   Clinical Data: No additional findings.   Subjective: Chief Complaint  Patient presents with  . Left Hip - Follow-up    Bone Scan review    HPI patient returns post left hip replacement on 05/11/2017 for femoral neck fracture.  She is had a bone scan due to subsidence of the femoral prosthesis which is negative for evidence of infection or loosening.  Review of Systems updated and unchanged.   Objective: Vital Signs: BP (!) 165/62   Pulse 64   Ht 5\' 6"  (1.676 m)   Wt 140 lb (63.5 kg)   LMP 01/13/1993   BMI 22.60 kg/m   Physical Exam  Constitutional: She is oriented to person, place, and time. She appears well-developed.  HENT:  Head: Normocephalic.  Right Ear: External ear normal.  Left Ear: External ear normal.  Eyes: Pupils are equal, round, and reactive to light.  Neck: No tracheal deviation present. No  thyromegaly present.  Cardiovascular: Normal rate.  Pulmonary/Chest: Effort normal.  Abdominal: Soft.  Neurological: She is alert and oriented to person, place, and time.  Skin: Skin is warm and dry.  Psychiatric: She has a normal mood and affect. Her behavior is normal.    Ortho Exam patient's amatory there is no edema around the hip.  Hip incision is well-healed.  Good knee range of motion.  Specialty Comments:  No specialty comments available.  Imaging:CLINICAL DATA:  Larey Seat in April 2019, hip fracture, post hip replacement surgery in June 2019, having LEFT hip pain question aseptic loosening  EXAM: NUCLEAR MEDICINE 3-PHASE BONE SCAN  TECHNIQUE: Radionuclide angiographic images, immediate static blood pool images, and 3-hour delayed static images were obtained of the hips after intravenous injection of radiopharmaceutical.  RADIOPHARMACEUTICALS:  21.6 mCi Tc-59m MDP IV  COMPARISON:  None  Radiographic correlation: LEFT hip radiographs 11/17/2017  FINDINGS: Vascular phase: Mildly increased blood flow to soft tissues inferomedial to the LEFT hip posteriorly. Normal blood flow to the RIGHT hip.  Blood pool phase: Increased blood pool to the soft tissues at the posteromedial aspect of the LEFT hip region. Normal blood pool at RIGHT hip and pelvis.  Delayed phase: Photopenic defect at LEFT hip from hip replacement surgery. Minimally increased tracer  accumulation adjacent to the proximal and distal aspects of the femoral component of the LEFT prosthesis consistent with expected postoperative change. No additional focal or intense increased uptake is seen at the LEFT hip to suggest loosening or infection. Normal uptake of tracer at the RIGHT hip joint and at the visualized portions of the pelvis and RIGHT femur.  IMPRESSION: Likely postoperative scintigraphic findings at the LEFT hip prosthesis without definite scintigraphic evidence of loosening  or infection.   Electronically Signed   By: Ulyses Southward M.D.   On: 11/30/2017 14:41    PMFS History: Patient Active Problem List   Diagnosis Date Noted  . 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 History of total hip arthroplasty, left 07/10/2017  . Closed left hip fracture, initial encounter (HCC) 05/05/2017  . Polymyalgia (HCC) 07/08/2013   Past Medical History:  Diagnosis Date  . Carpal tunnel syndrome on right 10/08  . Elevated cholesterol 5/93  . Nephrolithiasis 2010  . Osteoporosis   . Polymyalgia (HCC) 2/97    Family History  Problem Relation Age of Onset  . Hypertension Mother   . Thyroid disease Mother   . Stroke Father     Past Surgical History:  Procedure Laterality Date  . BLEPHAROPLASTY Bilateral 2/14   Dr. Shawna Orleans  . CARPAL TUNNEL RELEASE Right 6/08  . CERVICAL DISCECTOMY  12/91  . KNEE SURGERY Left   . spots removed     frozen and biopsied-all benign  . TONSILLECTOMY AND ADENOIDECTOMY    . TOTAL HIP ARTHROPLASTY Left 05/06/2017   Procedure: TOTAL ANTERIOR HIP ARTHROPLASTY -LEFT;  Surgeon: Eldred Manges, MD;  Location: [facility];  Service: Orthopedics;  Laterality: Left;   Social History   Occupational History  . Not on file  Tobacco Use  . Smoking status: Never Smoker  . Smokeless tobacco: Never Used  Substance and Sexual Activity  . Alcohol use: Yes    Alcohol/week: 1.0 standard drinks    Types: 1 Standard drinks or equivalent per week    Comment: 2 Drinks/month  . Drug use: No  . Sexual activity: Never    Partners: Male    Birth control/protection: Other-see comments    Comment: spouse with vasectomy

## 2017-12-15 DIAGNOSIS — Z6822 Body mass index (BMI) 22.0-22.9, adult: Secondary | ICD-10-CM | POA: Diagnosis not present

## 2017-12-15 DIAGNOSIS — M8000XD Age-related osteoporosis with current pathological fracture, unspecified site, subsequent encounter for fracture with routine healing: Secondary | ICD-10-CM | POA: Diagnosis not present

## 2017-12-15 DIAGNOSIS — M353 Polymyalgia rheumatica: Secondary | ICD-10-CM | POA: Diagnosis not present

## 2018-02-19 DIAGNOSIS — M810 Age-related osteoporosis without current pathological fracture: Secondary | ICD-10-CM | POA: Diagnosis not present

## 2018-03-11 DIAGNOSIS — R05 Cough: Secondary | ICD-10-CM | POA: Diagnosis not present

## 2018-03-11 DIAGNOSIS — R062 Wheezing: Secondary | ICD-10-CM | POA: Diagnosis not present

## 2018-03-11 DIAGNOSIS — Z8619 Personal history of other infectious and parasitic diseases: Secondary | ICD-10-CM | POA: Diagnosis not present

## 2018-03-11 DIAGNOSIS — I2510 Atherosclerotic heart disease of native coronary artery without angina pectoris: Secondary | ICD-10-CM | POA: Diagnosis not present

## 2018-03-11 DIAGNOSIS — R0781 Pleurodynia: Secondary | ICD-10-CM | POA: Diagnosis not present

## 2018-03-11 DIAGNOSIS — E7800 Pure hypercholesterolemia, unspecified: Secondary | ICD-10-CM | POA: Diagnosis not present

## 2018-03-12 ENCOUNTER — Encounter (INDEPENDENT_AMBULATORY_CARE_PROVIDER_SITE_OTHER): Payer: Self-pay | Admitting: Orthopaedic Surgery

## 2018-03-12 ENCOUNTER — Ambulatory Visit (INDEPENDENT_AMBULATORY_CARE_PROVIDER_SITE_OTHER): Payer: Medicare Other | Admitting: Orthopaedic Surgery

## 2018-03-12 ENCOUNTER — Ambulatory Visit (INDEPENDENT_AMBULATORY_CARE_PROVIDER_SITE_OTHER): Payer: Self-pay

## 2018-03-12 VITALS — BP 123/54 | HR 70 | Ht 66.0 in | Wt 138.0 lb

## 2018-03-12 DIAGNOSIS — Z96642 Presence of left artificial hip joint: Secondary | ICD-10-CM

## 2018-03-12 NOTE — Progress Notes (Signed)
Office Visit Note   Patient: Angela [name]           Date of Birth: Oct 13, 1940           MRN: [id number] Visit Date: 03/12/2018              Requested by: Sigmund Hazel, MD [address] [place], [place] [address] PCP: Sigmund Hazel, MD   Assessment & Plan: Visit Diagnoses:  1. History of total hip arthroplasty, left     Plan: Patient can return on an as-needed basis.  Follow-Up Instructions: No follow-ups on file.   Orders:  Orders Placed This Encounter  Procedures  . XR Pelvis 1-2 Views   No orders of the defined types were placed in this encounter.     Procedures: No procedures performed   Clinical Data: No additional findings.   Subjective: Chief Complaint  Patient presents with  . Left Hip - Follow-up    05/06/17 Left THA    HPI patient here in follow-up of left total hip arthroplasty done for femoral neck fracture that was displaced.  She had been protected weightbearing had good relief and the collar of the prosthesis is down to the lesser trochanter.  She is amatory without pain no limping.  She is on Prolia and as I discussed with her previously their recommendations at patient cycle off the medication for 6 months and get a bone density test 2 years after the start of treatment and then restarted if bone density is not returned to normal.  Problems with continued use of diphosphonates not allowing time for the bone to remodel and increased risk for midshaft femur fractures etc.  Had a previous bone density test to rule out loosening which was negative.  Review of Systems reviewed updated and unchanged.  Of note is polymyalgia.   Objective: Vital Signs: BP (!) 123/54   Pulse 70   Ht 5\' 6"  (1.676 m)   Wt 138 lb (62.6 kg)   LMP 01/13/1993   BMI 22.27 kg/m   Physical Exam Constitutional:      Appearance: She is well-developed.  HENT:     Head: Normocephalic.     Right Ear: External ear normal.     Left Ear: External ear normal.  Eyes:   Pupils: Pupils are equal, round, and reactive to light.  Neck:     Thyroid: No thyromegaly.     Trachea: No tracheal deviation.  Cardiovascular:     Rate and Rhythm: Normal rate.  Pulmonary:     Effort: Pulmonary effort is normal.  Abdominal:     Palpations: Abdomen is soft.  Skin:    General: Skin is warm and dry.  Neurological:     Mental Status: She is alert and oriented to person, place, and time.  Psychiatric:        Behavior: Behavior normal.     Ortho Exam patient's half inch short on the left femur has a healed left underneath her left foot.  Standing position she is close to the level.  Normal heel toe gait negative straight leg raising hip incisions well-healed.  Specialty Comments:  No specialty comments available.  Imaging: No results found.   PMFS History: Patient Active Problem List   Diagnosis Date Noted  . 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 History of total hip arthroplasty, left 07/10/2017  . Closed left hip fracture, initial encounter (HCC) 05/05/2017  . Polymyalgia (HCC) 07/08/2013   Past Medical History:  Diagnosis Date  . Carpal tunnel syndrome on right 10/08  . Elevated cholesterol 5/93  . Nephrolithiasis 2010  . Osteoporosis   . Polymyalgia (HCC) 2/97    Family History  Problem Relation Age of Onset  . Hypertension Mother   . Thyroid disease Mother   . Stroke Father     Past Surgical History:  Procedure Laterality Date  . BLEPHAROPLASTY Bilateral 2/14   Dr. Shawna Orleans  . CARPAL TUNNEL RELEASE Right 6/08  . CERVICAL DISCECTOMY  12/91  . KNEE SURGERY Left   . spots removed     frozen and biopsied-all benign  . TONSILLECTOMY AND ADENOIDECTOMY    . TOTAL HIP ARTHROPLASTY Left 05/06/2017   Procedure: TOTAL ANTERIOR HIP ARTHROPLASTY -LEFT;  Surgeon: Eldred Manges, MD;  Location: [facility] OR;  Service: Orthopedics;  Laterality: Left;   Social History   Occupational History  . Not on file  Tobacco Use  .  Smoking status: Never Smoker  . Smokeless tobacco: Never Used  Substance and Sexual Activity  . Alcohol use: Yes    Alcohol/week: 1.0 standard drinks    Types: 1 Standard drinks or equivalent per week    Comment: 2 Drinks/month  . Drug use: No  . Sexual activity: Never    Partners: Male    Birth control/protection: Other-see comments    Comment: spouse with vasectomy

## 2018-03-15 DIAGNOSIS — E7800 Pure hypercholesterolemia, unspecified: Secondary | ICD-10-CM | POA: Diagnosis not present

## 2018-03-15 DIAGNOSIS — Z8619 Personal history of other infectious and parasitic diseases: Secondary | ICD-10-CM | POA: Diagnosis not present

## 2018-03-15 DIAGNOSIS — I2510 Atherosclerotic heart disease of native coronary artery without angina pectoris: Secondary | ICD-10-CM | POA: Diagnosis not present

## 2018-03-15 DIAGNOSIS — R030 Elevated blood-pressure reading, without diagnosis of hypertension: Secondary | ICD-10-CM | POA: Diagnosis not present

## 2018-04-06 DIAGNOSIS — H8110 Benign paroxysmal vertigo, unspecified ear: Secondary | ICD-10-CM | POA: Diagnosis not present

## 2018-04-14 DIAGNOSIS — H903 Sensorineural hearing loss, bilateral: Secondary | ICD-10-CM | POA: Diagnosis not present

## 2018-04-14 DIAGNOSIS — H9120 Sudden idiopathic hearing loss, unspecified ear: Secondary | ICD-10-CM | POA: Diagnosis not present

## 2018-06-10 DIAGNOSIS — D3132 Benign neoplasm of left choroid: Secondary | ICD-10-CM | POA: Diagnosis not present

## 2018-06-10 DIAGNOSIS — H04123 Dry eye syndrome of bilateral lacrimal glands: Secondary | ICD-10-CM | POA: Diagnosis not present

## 2018-06-10 DIAGNOSIS — H02403 Unspecified ptosis of bilateral eyelids: Secondary | ICD-10-CM | POA: Diagnosis not present

## 2018-06-10 DIAGNOSIS — H52203 Unspecified astigmatism, bilateral: Secondary | ICD-10-CM | POA: Diagnosis not present

## 2018-06-23 ENCOUNTER — Encounter: Payer: Self-pay | Admitting: Obstetrics & Gynecology

## 2018-06-23 DIAGNOSIS — Z1231 Encounter for screening mammogram for malignant neoplasm of breast: Secondary | ICD-10-CM | POA: Diagnosis not present

## 2018-07-30 DIAGNOSIS — H9122 Sudden idiopathic hearing loss, left ear: Secondary | ICD-10-CM | POA: Diagnosis not present

## 2018-07-30 DIAGNOSIS — H903 Sensorineural hearing loss, bilateral: Secondary | ICD-10-CM | POA: Diagnosis not present

## 2018-08-09 ENCOUNTER — Other Ambulatory Visit: Payer: Self-pay | Admitting: Otolaryngology

## 2018-08-09 DIAGNOSIS — D225 Melanocytic nevi of trunk: Secondary | ICD-10-CM | POA: Diagnosis not present

## 2018-08-09 DIAGNOSIS — H9122 Sudden idiopathic hearing loss, left ear: Secondary | ICD-10-CM

## 2018-08-09 DIAGNOSIS — L821 Other seborrheic keratosis: Secondary | ICD-10-CM | POA: Diagnosis not present

## 2018-08-09 DIAGNOSIS — Z85828 Personal history of other malignant neoplasm of skin: Secondary | ICD-10-CM | POA: Diagnosis not present

## 2018-08-09 DIAGNOSIS — B078 Other viral warts: Secondary | ICD-10-CM | POA: Diagnosis not present

## 2018-08-09 DIAGNOSIS — L814 Other melanin hyperpigmentation: Secondary | ICD-10-CM | POA: Diagnosis not present

## 2018-08-23 DIAGNOSIS — M810 Age-related osteoporosis without current pathological fracture: Secondary | ICD-10-CM | POA: Diagnosis not present

## 2018-08-30 DIAGNOSIS — E7800 Pure hypercholesterolemia, unspecified: Secondary | ICD-10-CM | POA: Diagnosis not present

## 2018-08-30 DIAGNOSIS — I700 Atherosclerosis of aorta: Secondary | ICD-10-CM | POA: Diagnosis not present

## 2018-08-30 DIAGNOSIS — M810 Age-related osteoporosis without current pathological fracture: Secondary | ICD-10-CM | POA: Diagnosis not present

## 2018-08-30 DIAGNOSIS — I2510 Atherosclerotic heart disease of native coronary artery without angina pectoris: Secondary | ICD-10-CM | POA: Diagnosis not present

## 2018-08-30 DIAGNOSIS — H9193 Unspecified hearing loss, bilateral: Secondary | ICD-10-CM | POA: Diagnosis not present

## 2018-08-30 DIAGNOSIS — Z0000 Encounter for general adult medical examination without abnormal findings: Secondary | ICD-10-CM | POA: Diagnosis not present

## 2018-08-30 DIAGNOSIS — Z8781 Personal history of (healed) traumatic fracture: Secondary | ICD-10-CM | POA: Diagnosis not present

## 2018-08-30 DIAGNOSIS — R911 Solitary pulmonary nodule: Secondary | ICD-10-CM | POA: Diagnosis not present

## 2018-09-01 ENCOUNTER — Other Ambulatory Visit: Payer: Self-pay

## 2018-09-01 ENCOUNTER — Other Ambulatory Visit: Payer: Self-pay | Admitting: Family Medicine

## 2018-09-01 DIAGNOSIS — R911 Solitary pulmonary nodule: Secondary | ICD-10-CM

## 2018-09-03 ENCOUNTER — Other Ambulatory Visit: Payer: Self-pay

## 2018-09-03 ENCOUNTER — Ambulatory Visit (INDEPENDENT_AMBULATORY_CARE_PROVIDER_SITE_OTHER): Payer: Medicare Other | Admitting: Obstetrics & Gynecology

## 2018-09-03 ENCOUNTER — Encounter: Payer: Self-pay | Admitting: Obstetrics & Gynecology

## 2018-09-03 VITALS — BP 120/60 | HR 60 | Temp 97.3°F | Ht 65.5 in | Wt 143.0 lb

## 2018-09-03 DIAGNOSIS — Z124 Encounter for screening for malignant neoplasm of cervix: Secondary | ICD-10-CM

## 2018-09-03 DIAGNOSIS — Z01419 Encounter for gynecological examination (general) (routine) without abnormal findings: Secondary | ICD-10-CM | POA: Diagnosis not present

## 2018-09-03 NOTE — Progress Notes (Signed)
78 y.o. G3P3 Married White or Caucasian female here for annual exam.  Doing well.  Had issues with vertigo in February.  This was associated with hearing loss.  Saw Dr. Janace Hoard, ENT.  Was placed on prednisone.  Hearing did improve some.  Has MRI scheduled to assess for tumor.  Did have hearing aids adjusted.  This has helped as well.  Had hip fracture in April.  On Prolia.  Seeing Dr. Amil Amen.  Has BMD scheduled for December.    Denies vaginal bleeding.    PCP:  Dr. Kathyrn Lass.  Last appt was a week ago.  This was a virtual visit.  Had blood work in June.    Patient's last menstrual period was 01/13/1993.          Sexually active: No.  The current method of family planning is post menopausal status.    Exercising: Yes.    walk Smoker:  no  Health Maintenance: Pap:  03/05/17 Neg   08/04/14 Neg  History of abnormal Pap:  no MMG:  06/23/18 BIRADS1:neg  Colonoscopy:  07/2014 Normal  BMD:   12/01/16 osteoporosis  TDaP:  2019 Pneumonia vaccine(s):  Done  Shingrix:   No Hep C testing: n/a Screening Labs: PCP   reports that she has never smoked. She has never used smokeless tobacco. She reports current alcohol use of about 1.0 standard drinks of alcohol per week. She reports that she does not use drugs.  Past Medical History:  Diagnosis Date  . Carpal tunnel syndrome on right 10/08  . Elevated cholesterol 5/93  . Nephrolithiasis 2010  . Osteoporosis   . Polymyalgia ([place]) 2/97    Past Surgical History:  Procedure Laterality Date  . BLEPHAROPLASTY Bilateral 2/14   Dr. Isidoro Donning  . CARPAL TUNNEL RELEASE Right 6/08  . CERVICAL DISCECTOMY  12/91  . KNEE SURGERY Left   . spots removed     frozen and biopsied-all benign  . TONSILLECTOMY AND ADENOIDECTOMY    . TOTAL HIP ARTHROPLASTY Left 05/06/2017   Procedure: TOTAL ANTERIOR HIP ARTHROPLASTY -LEFT;  Surgeon: Marybelle Killings, MD;  Location: [facility];  Service: Orthopedics;  Laterality: Left;    Current Outpatient Medications   Medication Sig Dispense Refill  . aspirin EC 81 MG tablet Take by mouth.    . Cholecalciferol (VITAMIN D-3 PO) Take 1 capsule by mouth daily.    Marland Kitchen denosumab (PROLIA) 60 MG/ML SOSY injection Inject 60 mg into the skin.    . Multiple Vitamins-Minerals (MULTIVITAMIN PO) Take 1 tablet by mouth daily.     . Omega-3 Fatty Acids (FISH OIL PO) Take 1 capsule by mouth daily.     . rosuvastatin (CRESTOR) 10 MG tablet Take 10 mg by mouth daily.  2  . SYMBICORT 160-4.5 MCG/ACT inhaler Inhale 2 puffs into the lungs 2 (two) times daily.     No current facility-administered medications for this visit.     Family History  Problem Relation Age of Onset  . Hypertension Mother   . Thyroid disease Mother   . Stroke Father     Review of Systems  All other systems reviewed and are negative.   Exam:   BP 120/60   Pulse 60   Temp (!) 97.3 F (36.3 C) (Temporal)   Ht 5' 5.5" (1.664 m)   Wt 143 lb (64.9 kg)   LMP 01/13/1993   BMI 23.43 kg/m    Height: 5' 5.5" (166.4 cm)  Ht Readings from Last 3 Encounters:  09/03/18 5' 5.5" (1.664 m)  03/12/18 5\' 6"  (1.676 m)  12/04/17 5\' 6"  (1.676 m)    General appearance: alert, cooperative and appears stated age Head: Normocephalic, without obvious abnormality, atraumatic Neck: no adenopathy, supple, symmetrical, trachea midline and thyroid normal to inspection and palpation Lungs: clear to auscultation bilaterally Breasts: normal appearance, no masses or tenderness Heart: regular rate and rhythm Abdomen: soft, non-tender; bowel sounds normal; no masses,  no organomegaly Extremities: extremities normal, atraumatic, no cyanosis or edema Skin: Skin color, texture, turgor normal. No rashes or lesions Lymph nodes: Cervical, supraclavicular, and axillary nodes normal. No abnormal inguinal nodes palpated Neurologic: Grossly normal   Pelvic: External genitalia:  no lesions              Urethra:  normal appearing urethra with no masses, tenderness or  lesions              Bartholins and Skenes: normal                 Vagina: normal appearing vagina with normal color and discharge, no lesions, vaginal apex narrowing              Cervix: no lesions              Pap taken: No. Bimanual Exam:  Uterus:  normal size, contour, position, consistency, mobility, non-tender              Adnexa: normal adnexa and no mass, fullness, tenderness               Rectovaginal: Confirms               Anus:  normal sphincter tone, no lesions  Chaperone was present for exam.  A:  Well Woman with normal exam PMP, no HRT Polymyalgia, followed by Dr. Amil Amen H/o nephrolithiasis Osteoporosis with hip fracture 4/19, on Prolia Elevated lipids Recent vertigo Vaginal apex narrowing  P:   Mammogram guidelines reviewed.  Pt is UTD. pap smear neg 2019.  Not indicated today. Colonoscopy is UTD Tdap 2019 Declines shingrix vaccination Lab work done with Dr. Kathyrn Lass in June Return annually or prn

## 2018-09-07 ENCOUNTER — Ambulatory Visit
Admission: RE | Admit: 2018-09-07 | Discharge: 2018-09-07 | Disposition: A | Discharge status: Home / Self Care | Payer: Medicare Other | Source: Ambulatory Visit | Attending: Otolaryngology | Admitting: Otolaryngology

## 2018-09-07 ENCOUNTER — Other Ambulatory Visit: Payer: Self-pay

## 2018-09-07 DIAGNOSIS — H9122 Sudden idiopathic hearing loss, left ear: Secondary | ICD-10-CM

## 2018-09-07 DIAGNOSIS — H748X2 Other specified disorders of left middle ear and mastoid: Secondary | ICD-10-CM | POA: Diagnosis not present

## 2018-09-07 IMAGING — MR MR BRAIN/IAC WITHOUT AND WITH CONTRAST
12 series · 38 of 48 positions shown · IV contrast (multihance)
Comparison: None.

CLINICAL DATA: Sudden hearing loss left ear

Creatinine was obtained on site at [HOSPITAL] at [HOSPITAL].
Results: Creatinine 0.8 mg/dL.
EXAM:
MRI HEAD WITHOUT AND WITH CONTRAST
TECHNIQUE: Multiplanar, multiecho pulse sequences of the brain and surrounding
structures were obtained without and with intravenous contrast.
CONTRAST:  13mL MULTIHANCE GADOBENATE DIMEGLUMINE 529 MG/ML IV SOLN

[Series 2: T1 · sagittal · 5.0mm · 0.45mm/px · 2 of 21 slices shown (1 of 4)]
[im 1/21]
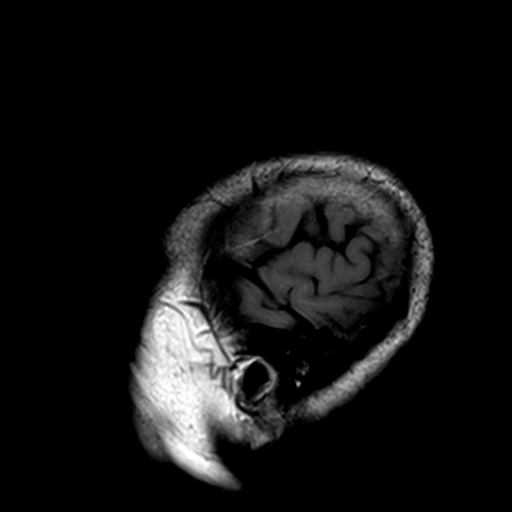
[im 21/21]
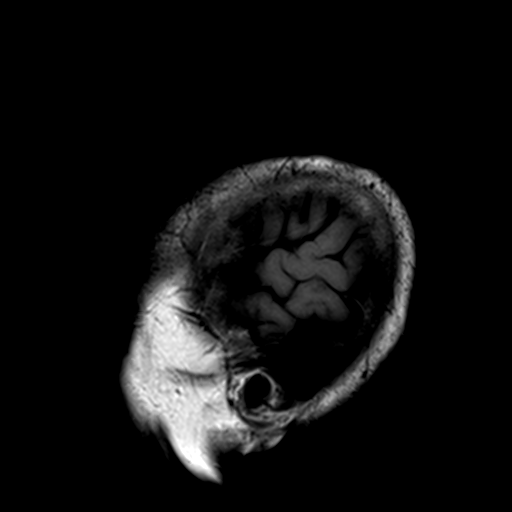

[Series 3: [id number]_512 · axial · 5.0mm · 0.60mm/px · z∈[-53,+90]mm · 3 of 24 slices shown]
[im 1/24]
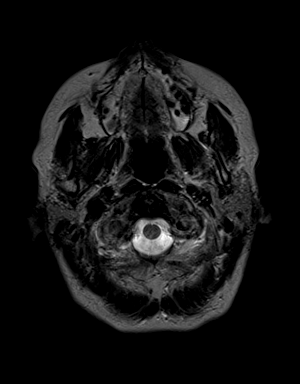
[im 12/24]
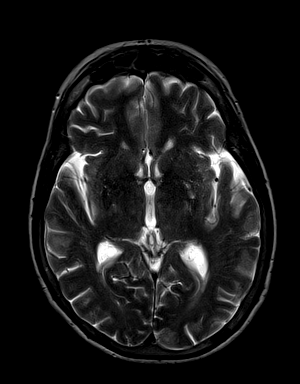
[im 24/24]
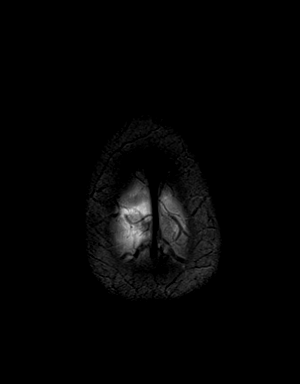

[Series 4: ep2d_diff_3 · axial · 3.0mm · 1.80mm/px · z∈[-54,+92]mm · 8 of 99 slices shown]
[im 1/99]
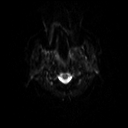
[im 20/99]
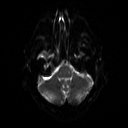
[im 30/99]
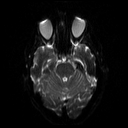
[im 40/99]
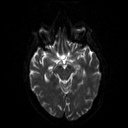
[im 59/99]
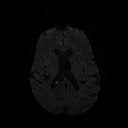
[im 69/99]
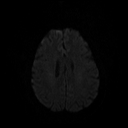
[im 79/99]
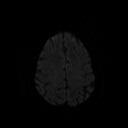
[im 99/99]
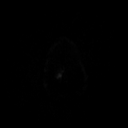

[Series 5: ep2d_diff_3_adc · axial · 3.0mm · 1.80mm/px · z∈[-54,+92]mm · 6 of 50 slices shown]
[im 1/50]
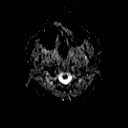
[im 10/50]
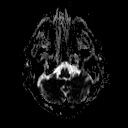
[im 20/50]
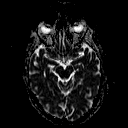
[im 30/50]
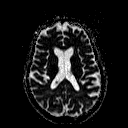
[im 40/50]
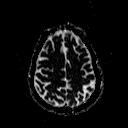
[im 50/50]
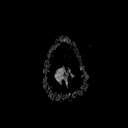

[Series 6: T1 · coronal · 3.0mm · 0.35mm/px · 1 of 11 slices shown (2 of 4)]
[im 1/11]
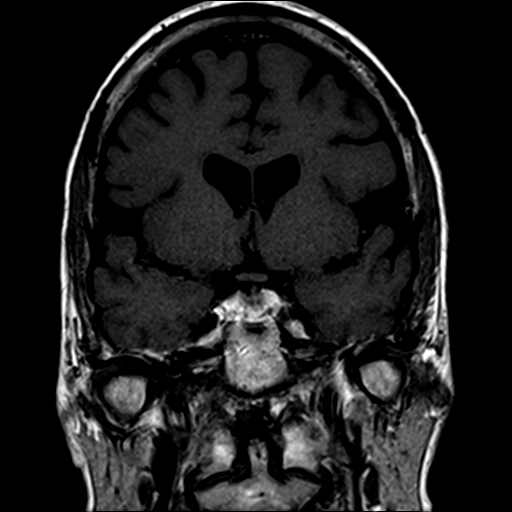

[Series 8: swi_images · axial · 3.0mm · 0.90mm/px · z∈[-57,+95]mm · 6 of 52 slices shown]
[im 1/52]
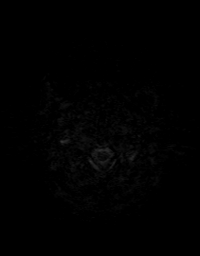
[im 11/52]
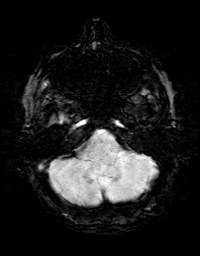
[im 21/52]
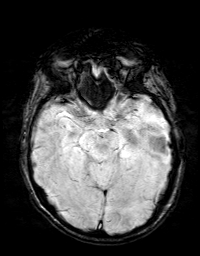
[im 31/52]
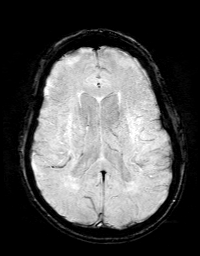
[im 41/52]
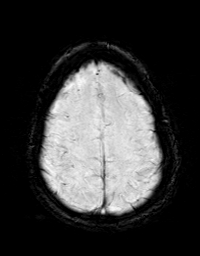
[im 52/52]
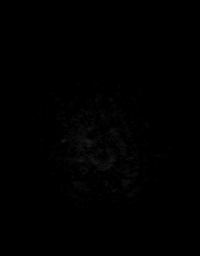

[Series 9: FLAIR · axial · 3.0mm · 0.43mm/px · z∈[-53,+90]mm · 3 of 30 slices shown]
[im 1/30]
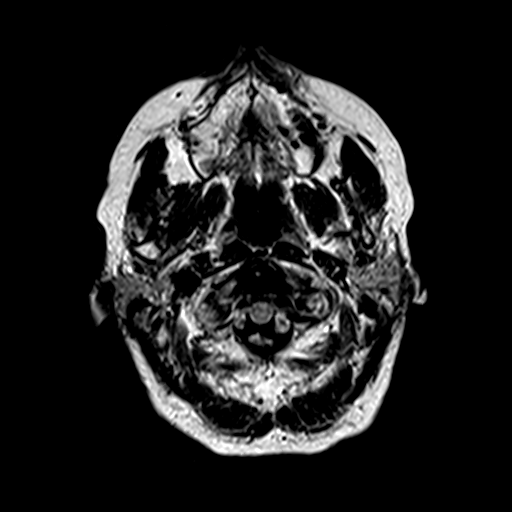
[im 15/30]
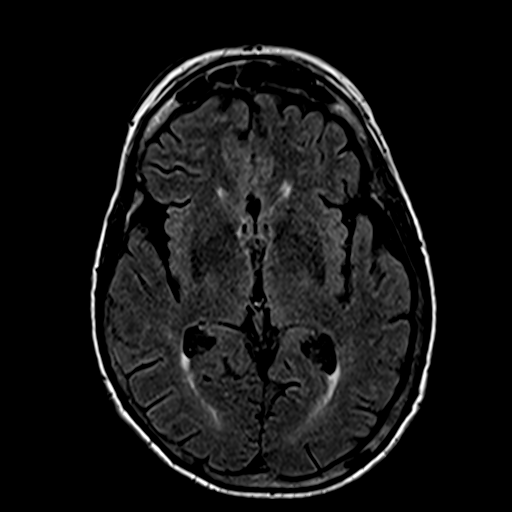
[im 30/30]
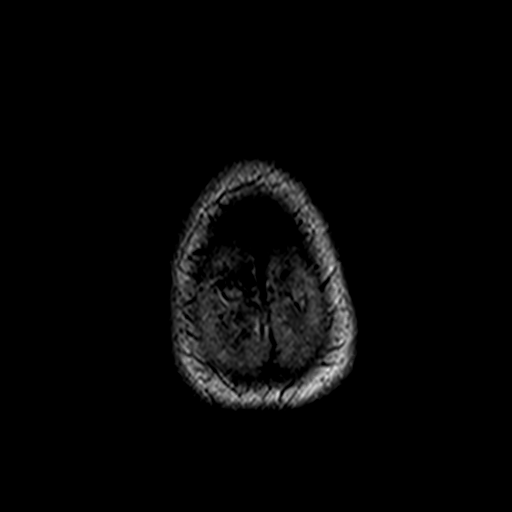

[Series 10: T1 · axial · 3.0mm · 0.35mm/px · 1 of 11 slices shown (3 of 4)]
[im 1/11]
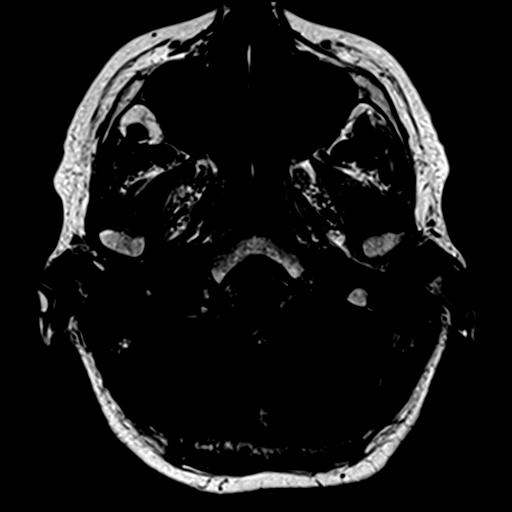

[Series 11: bSSFP · axial · 1.0mm · 0.28mm/px · z∈[-45,-15]mm · 4 of 32 slices shown]
[im 1/32]
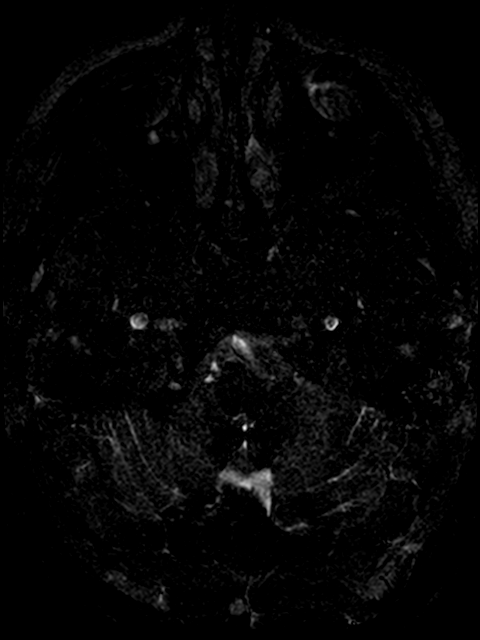
[im 11/32]
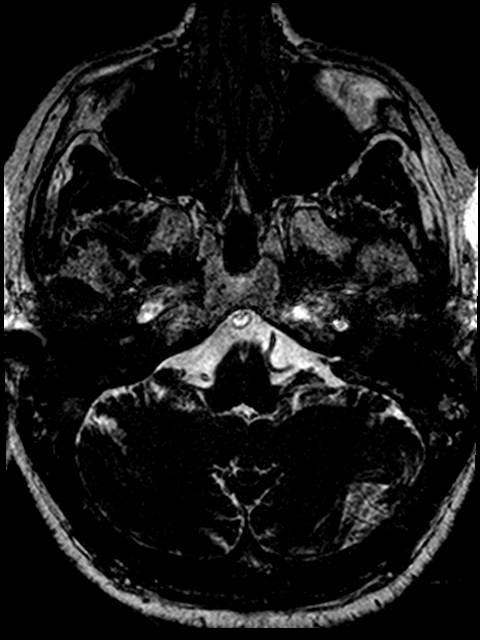
[im 21/32]
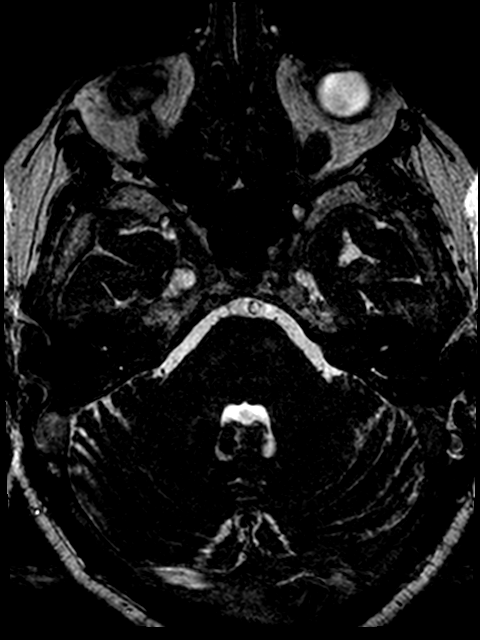
[im 32/32]
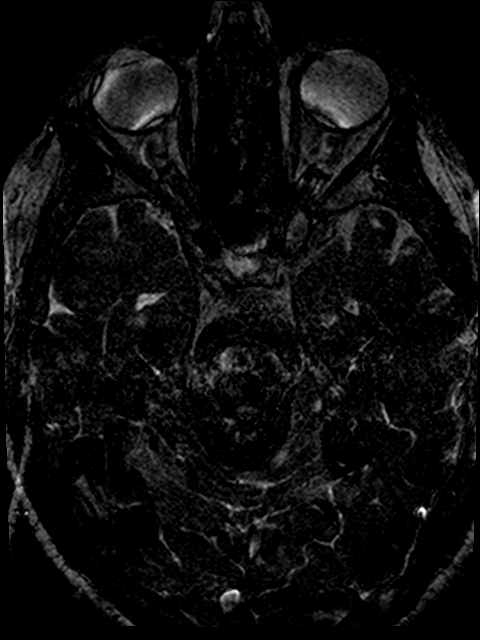

[Series 12: T1 · coronal · 3.0mm · 0.35mm/px · 1 of 11 slices shown (4 of 4)]
[im 1/11]
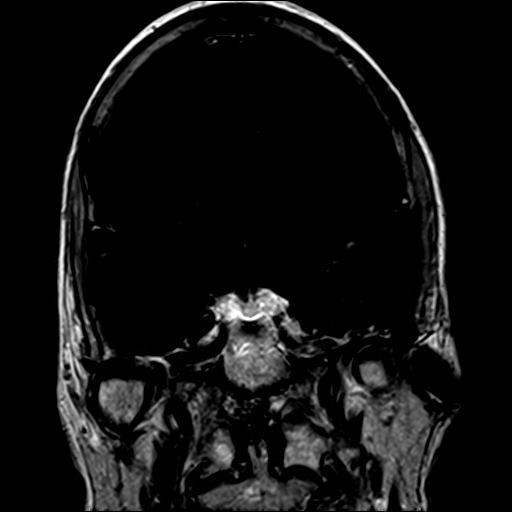

[Series 13: T1 post-contrast · axial · 3.0mm · 0.35mm/px · 1 of 11 slices shown]
[im 1/11]
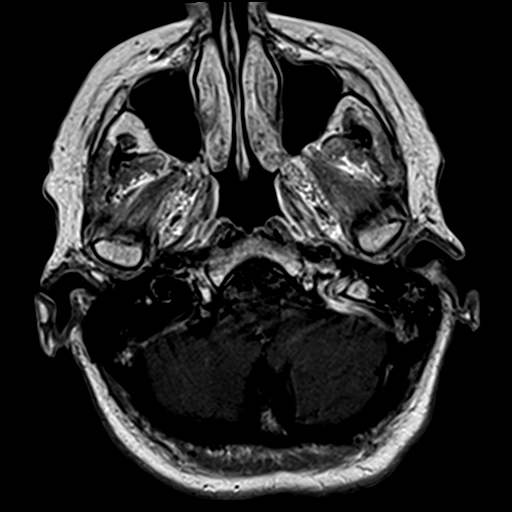

[Series 14: axial (person_name) · axial · 2.0mm · 0.45mm/px · z∈[-60,-42]mm · 2 of 80 slices shown]
[im 1/80]
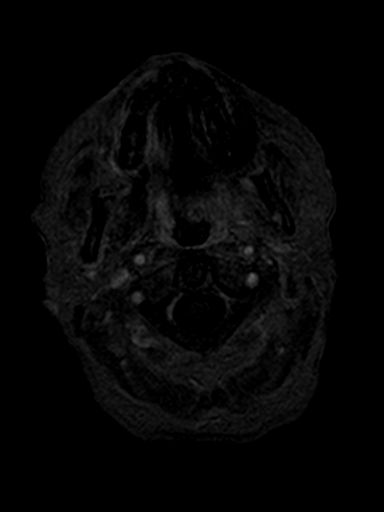
[im 10/80]
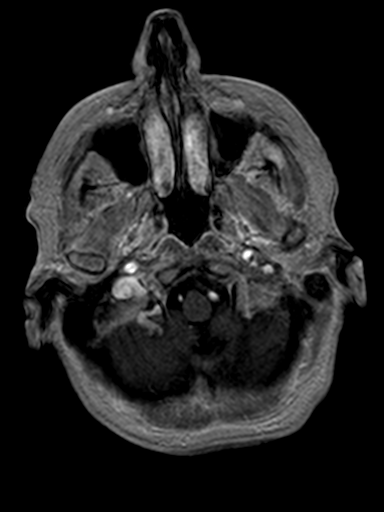

[38 of 48 positions shown; findings below may reference images not displayed]

FINDINGS: Brain: IAC protocol. Seventh and eighth cranial nerves normal.
Negative for vestibular schwannoma. Basilar cisterns normal. No
posterior fossa mass. Brainstem and cerebellum normal. Small left
mastoid effusion. Right mastoid clear.

Ventricle size normal. Mild cerebral atrophy. Negative for acute
infarct, hemorrhage, mass. Mild white matter hyperintensity in the
parietal lobe bilaterally. Normal enhancement postcontrast
administration

Vascular: Normal arterial flow voids.

Skull and upper cervical spine: Negative

Sinuses/Orbits: Mild mucosal edema paranasal sinuses. Left mastoid
effusion. Bilateral cataract surgery.

Other: None
IMPRESSION: Negative for vestibular schwannoma or mass. Left mastoid effusion
noted.

No acute intracranial abnormality.

## 2018-09-07 MED ORDER — GADOBENATE DIMEGLUMINE 529 MG/ML IV SOLN
13.0000 mL | Freq: Once | INTRAVENOUS | Status: AC | PRN
  Administered 2018-09-07: 11:00:00 13 mL via INTRAVENOUS

## 2018-10-07 DIAGNOSIS — Z23 Encounter for immunization: Secondary | ICD-10-CM | POA: Diagnosis not present

## 2018-10-25 ENCOUNTER — Encounter: Payer: Self-pay | Admitting: Cardiology

## 2018-10-25 ENCOUNTER — Ambulatory Visit (INDEPENDENT_AMBULATORY_CARE_PROVIDER_SITE_OTHER): Payer: Medicare Other | Admitting: Cardiology

## 2018-10-25 ENCOUNTER — Other Ambulatory Visit: Payer: Self-pay

## 2018-10-25 VITALS — BP 149/67 | HR 94 | Temp 98.0°F | Ht 65.0 in | Wt 145.0 lb

## 2018-10-25 DIAGNOSIS — I2510 Atherosclerotic heart disease of native coronary artery without angina pectoris: Secondary | ICD-10-CM | POA: Insufficient documentation

## 2018-10-25 DIAGNOSIS — I2584 Coronary atherosclerosis due to calcified coronary lesion: Secondary | ICD-10-CM | POA: Diagnosis not present

## 2018-10-25 DIAGNOSIS — R030 Elevated blood-pressure reading, without diagnosis of hypertension: Secondary | ICD-10-CM | POA: Insufficient documentation

## 2018-10-25 NOTE — Progress Notes (Signed)
Follow up visit  Subjective:   Angela [name], female    DOB: 1940-09-11, 78 y.o.   MRN: [id number]   Chief Complaint  Patient presents with  . Coronary Artery Disease  . Follow-up    1 year     HPI  78 y/o Caucasian female with hypertension and hyperlipidemia, incidental finding of left main and LAD calcified plaque, hip surgery 04/2017 owing to fall.  Patient denies chest pain, shortness of breath, palpitations, leg edema, orthopnea, PND, TIA/syncope. She walks roughly 3 miles/day without any symptoms. She cut down her Crestor 10 mg to 1/2 tablet a day due to nausea. She has not tolerated other statins either. She ha snot had lipid panel checked since 2019 fall. Blood pressure elevated today, but much lower at home.    Past Medical History:  Diagnosis Date  . Carpal tunnel syndrome on right 10/08  . Elevated cholesterol 5/93  . Nephrolithiasis 2010  . Osteoporosis   . 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 BLEPHAROPLASTY Bilateral 2/14   Dr. Isidoro Donning  . CARPAL TUNNEL RELEASE Right 6/08  . CERVICAL DISCECTOMY  12/91  . KNEE SURGERY Left   . spots removed     frozen and biopsied-all benign  . TONSILLECTOMY AND ADENOIDECTOMY    . TOTAL HIP ARTHROPLASTY Left 05/06/2017   Procedure: TOTAL ANTERIOR HIP ARTHROPLASTY -LEFT;  Surgeon: Marybelle Killings, MD;  Location: [facility];  Service: Orthopedics;  Laterality: Left;     Social History   Socioeconomic History  . Marital status: Married    Spouse name: Not on file  . Number of children: 3  . Years of education: Not on file  . Highest education level: Not on file  Occupational History  . Not on file  Social Needs  . Financial resource strain: Not on file  . Food insecurity    Worry: Not on file    Inability: Not on file  . Transportation needs    Medical: Not on file    Non-medical: Not on file  Tobacco Use  . Smoking status: Never Smoker  . Smokeless tobacco: Never  Used  Substance and Sexual Activity  . Alcohol use: Yes    Alcohol/week: 1.0 standard drinks    Types: 1 Standard drinks or equivalent per week    Comment: 2 Drinks/month  . Drug use: No  . Sexual activity: Not Currently    Partners: Male    Birth control/protection: Other-see comments    Comment: spouse with vasectomy  Lifestyle  . Physical activity    Days per week: Not on file    Minutes per session: Not on file  . Stress: Not on file  Relationships  . Social Herbalist on phone: Not on file    Gets together: Not on file    Attends religious service: Not on file    Active member of club or organization: Not on file    Attends meetings of clubs or organizations: Not on file    Relationship status: Not on file  . Intimate partner violence    Fear of current or ex partner: Not on file    Emotionally abused: Not on file    Physically abused: Not on file    Forced sexual activity: Not on file  Other Topics Concern  . Not on file  Social History Narrative  . Not on file     Family  Follow up visit  Subjective:   Angela [name], female    DOB: 1940-09-11, 78 y.o.   MRN: [id number]   Chief Complaint  Patient presents with  . Coronary Artery Disease  . Follow-up    1 year     HPI  78 y/o Caucasian female with hypertension and hyperlipidemia, incidental finding of left main and LAD calcified plaque, hip surgery 04/2017 owing to fall.  Patient denies chest pain, shortness of breath, palpitations, leg edema, orthopnea, PND, TIA/syncope. She walks roughly 3 miles/day without any symptoms. She cut down her Crestor 10 mg to 1/2 tablet a day due to nausea. She has not tolerated other statins either. She ha snot had lipid panel checked since 2019 fall. Blood pressure elevated today, but much lower at home.    Past Medical History:  Diagnosis Date  . Carpal tunnel syndrome on right 10/08  . Elevated cholesterol 5/93  . Nephrolithiasis 2010  . Osteoporosis   . 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 BLEPHAROPLASTY Bilateral 2/14   Dr. Isidoro Donning  . CARPAL TUNNEL RELEASE Right 6/08  . CERVICAL DISCECTOMY  12/91  . KNEE SURGERY Left   . spots removed     frozen and biopsied-all benign  . TONSILLECTOMY AND ADENOIDECTOMY    . TOTAL HIP ARTHROPLASTY Left 05/06/2017   Procedure: TOTAL ANTERIOR HIP ARTHROPLASTY -LEFT;  Surgeon: Marybelle Killings, MD;  Location: [facility];  Service: Orthopedics;  Laterality: Left;     Social History   Socioeconomic History  . Marital status: Married    Spouse name: Not on file  . Number of children: 3  . Years of education: Not on file  . Highest education level: Not on file  Occupational History  . Not on file  Social Needs  . Financial resource strain: Not on file  . Food insecurity    Worry: Not on file    Inability: Not on file  . Transportation needs    Medical: Not on file    Non-medical: Not on file  Tobacco Use  . Smoking status: Never Smoker  . Smokeless tobacco: Never  Used  Substance and Sexual Activity  . Alcohol use: Yes    Alcohol/week: 1.0 standard drinks    Types: 1 Standard drinks or equivalent per week    Comment: 2 Drinks/month  . Drug use: No  . Sexual activity: Not Currently    Partners: Male    Birth control/protection: Other-see comments    Comment: spouse with vasectomy  Lifestyle  . Physical activity    Days per week: Not on file    Minutes per session: Not on file  . Stress: Not on file  Relationships  . Social Herbalist on phone: Not on file    Gets together: Not on file    Attends religious service: Not on file    Active member of club or organization: Not on file    Attends meetings of clubs or organizations: Not on file    Relationship status: Not on file  . Intimate partner violence    Fear of current or ex partner: Not on file    Emotionally abused: Not on file    Physically abused: Not on file    Forced sexual activity: Not on file  Other Topics Concern  . Not on file  Social History Narrative  . Not on file     Family  Follow up visit  Subjective:   Angela [name], female    DOB: 1940-09-11, 78 y.o.   MRN: [id number]   Chief Complaint  Patient presents with  . Coronary Artery Disease  . Follow-up    1 year     HPI  78 y/o Caucasian female with hypertension and hyperlipidemia, incidental finding of left main and LAD calcified plaque, hip surgery 04/2017 owing to fall.  Patient denies chest pain, shortness of breath, palpitations, leg edema, orthopnea, PND, TIA/syncope. She walks roughly 3 miles/day without any symptoms. She cut down her Crestor 10 mg to 1/2 tablet a day due to nausea. She has not tolerated other statins either. She ha snot had lipid panel checked since 2019 fall. Blood pressure elevated today, but much lower at home.    Past Medical History:  Diagnosis Date  . Carpal tunnel syndrome on right 10/08  . Elevated cholesterol 5/93  . Nephrolithiasis 2010  . Osteoporosis   . 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 BLEPHAROPLASTY Bilateral 2/14   Dr. Isidoro Donning  . CARPAL TUNNEL RELEASE Right 6/08  . CERVICAL DISCECTOMY  12/91  . KNEE SURGERY Left   . spots removed     frozen and biopsied-all benign  . TONSILLECTOMY AND ADENOIDECTOMY    . TOTAL HIP ARTHROPLASTY Left 05/06/2017   Procedure: TOTAL ANTERIOR HIP ARTHROPLASTY -LEFT;  Surgeon: Marybelle Killings, MD;  Location: [facility];  Service: Orthopedics;  Laterality: Left;     Social History   Socioeconomic History  . Marital status: Married    Spouse name: Not on file  . Number of children: 3  . Years of education: Not on file  . Highest education level: Not on file  Occupational History  . Not on file  Social Needs  . Financial resource strain: Not on file  . Food insecurity    Worry: Not on file    Inability: Not on file  . Transportation needs    Medical: Not on file    Non-medical: Not on file  Tobacco Use  . Smoking status: Never Smoker  . Smokeless tobacco: Never  Used  Substance and Sexual Activity  . Alcohol use: Yes    Alcohol/week: 1.0 standard drinks    Types: 1 Standard drinks or equivalent per week    Comment: 2 Drinks/month  . Drug use: No  . Sexual activity: Not Currently    Partners: Male    Birth control/protection: Other-see comments    Comment: spouse with vasectomy  Lifestyle  . Physical activity    Days per week: Not on file    Minutes per session: Not on file  . Stress: Not on file  Relationships  . Social Herbalist on phone: Not on file    Gets together: Not on file    Attends religious service: Not on file    Active member of club or organization: Not on file    Attends meetings of clubs or organizations: Not on file    Relationship status: Not on file  . Intimate partner violence    Fear of current or ex partner: Not on file    Emotionally abused: Not on file    Physically abused: Not on file    Forced sexual activity: Not on file  Other Topics Concern  . Not on file  Social History Narrative  . Not on file     Family

## 2018-11-01 DIAGNOSIS — I2584 Coronary atherosclerosis due to calcified coronary lesion: Secondary | ICD-10-CM | POA: Diagnosis not present

## 2018-11-01 DIAGNOSIS — I2510 Atherosclerotic heart disease of native coronary artery without angina pectoris: Secondary | ICD-10-CM | POA: Diagnosis not present

## 2018-11-02 LAB — LIPID PANEL
Chol/HDL Ratio: 2.1 ratio (ref 0.0–4.4)
Cholesterol, Total: 169 mg/dL (ref 100–199)
HDL: 79 mg/dL (ref >39)
LDL Chol Calc (NIH): 75 mg/dL (ref 0–99)
Triglycerides: 83 mg/dL (ref 0–149)
VLDL Cholesterol Cal: 15 mg/dL (ref 5–40)

## 2018-11-15 ENCOUNTER — Other Ambulatory Visit: Payer: Self-pay

## 2018-11-15 ENCOUNTER — Encounter: Payer: Self-pay | Admitting: Cardiology

## 2018-11-15 ENCOUNTER — Ambulatory Visit (INDEPENDENT_AMBULATORY_CARE_PROVIDER_SITE_OTHER): Payer: Medicare Other | Admitting: Cardiology

## 2018-11-15 VITALS — BP 140/42 | HR 65 | Temp 93.3°F | Ht 65.0 in | Wt 146.0 lb

## 2018-11-15 DIAGNOSIS — R030 Elevated blood-pressure reading, without diagnosis of hypertension: Secondary | ICD-10-CM

## 2018-11-15 DIAGNOSIS — I2510 Atherosclerotic heart disease of native coronary artery without angina pectoris: Secondary | ICD-10-CM | POA: Diagnosis not present

## 2018-11-15 DIAGNOSIS — R011 Cardiac murmur, unspecified: Secondary | ICD-10-CM | POA: Diagnosis not present

## 2018-11-15 DIAGNOSIS — I2584 Coronary atherosclerosis due to calcified coronary lesion: Secondary | ICD-10-CM | POA: Diagnosis not present

## 2018-11-15 DIAGNOSIS — R0989 Other specified symptoms and signs involving the circulatory and respiratory systems: Secondary | ICD-10-CM

## 2018-11-15 MED ORDER — ROSUVASTATIN CALCIUM 5 MG PO TABS: 5.0000 mg | ORAL_TABLET | Freq: Every day | ORAL | 3 refills | Status: DC

## 2018-11-15 NOTE — Progress Notes (Signed)
Follow up visit  Subjective:   Angela [name], female    DOB: 02-08-1940, 78 y.o.   MRN: [id number]   Chief Complaint  Patient presents with  . Hypertension    3 week follow up     HPI  78 y/o Caucasian female with hyperlipidemia, incidental finding of left main and LAD calcified plaque, hip surgery 04/2017 owing to fall.  Patient denies chest pain, shortness of breath, palpitations, leg edema, orthopnea, PND, TIA/syncope. She walks roughly 3 miles/day without any symptoms. She cut down her Crestor 10 mg to 1/2 tablet a day due to nausea.  Nonetheless, her lipid panel is favorable with HDL of 79 and LDL of 75.  At last visit, blood pressure was elevated.  Thus, follow-up appointment was made today to recheck her blood pressure.    BP today is 140/42 mmHg. Home BP log shows BP 104-139/45-76 mmHg. She has never had an echocardiogram.   Past Medical History:  Diagnosis Date  . Carpal tunnel syndrome on right 10/08  . Elevated cholesterol 5/93  . Nephrolithiasis 2010  . Osteoporosis   . Polymyalgia ([facility]) 2/97     Past Surgical History:  Procedure Laterality Date  . BLEPHAROPLASTY Bilateral 2/14   Dr. Shawna Orleans  . CARPAL TUNNEL RELEASE Right 6/08  . CERVICAL DISCECTOMY  12/91  . KNEE SURGERY Left   . spots removed     frozen and biopsied-all benign  . TONSILLECTOMY AND ADENOIDECTOMY    . TOTAL HIP ARTHROPLASTY Left 05/06/2017   Procedure: TOTAL ANTERIOR HIP ARTHROPLASTY -LEFT;  Surgeon: Eldred Manges, MD;  Location: [facility];  Service: Orthopedics;  Laterality: Left;     Social History   Socioeconomic History  . Marital status: Married    Spouse name: Not on file  . Number of children: 3  . Years of education: Not on file  . Highest education level: Not on file  Occupational History  . Not on file  Social Needs  . Financial resource strain: Not on file  . Food insecurity    Worry: Not on file    Inability: Not on file  . Transportation needs    Medical:  Not on file    Non-medical: Not on file  Tobacco Use  . Smoking status: Never Smoker  . Smokeless tobacco: Never Used  Substance and Sexual Activity  . Alcohol use: Yes    Alcohol/week: 1.0 standard drinks    Types: 1 Standard drinks or equivalent per week    Comment: 2 Drinks/month  . Drug use: No  . Sexual activity: Not Currently    Partners: Male    Birth control/protection: Other-see comments    Comment: spouse with vasectomy  Lifestyle  . Physical activity    Days per week: Not on file    Minutes per session: Not on file  . Stress: Not on file  Relationships  . Social Musician on phone: Not on file    Gets together: Not on file    Attends religious service: Not on file    Active member of club or organization: Not on file    Attends meetings of clubs or organizations: Not on file    Relationship status: Not on file  . Intimate partner violence    Fear of current or ex partner: Not on file    Emotionally abused: Not on file    Physically abused: Not on file    Forced sexual activity: Not on  file  Other Topics Concern  . Not on file  Social History Narrative  . Not on file     Family History  Problem Relation Age of Onset  . Hypertension Mother   . Thyroid disease Mother   . Stroke Father      Current Outpatient Medications on File Prior to Visit  Medication Sig Dispense Refill  . aspirin EC 81 MG tablet Take by mouth.    . Cholecalciferol (VITAMIN D-3 PO) Take 1 capsule by mouth daily.    Marland Kitchen denosumab (PROLIA) 60 MG/ML SOSY injection Inject 60 mg into the skin.    . Multiple Vitamins-Minerals (MULTIVITAMIN PO) Take 1 tablet by mouth daily.     . Omega-3 Fatty Acids (FISH OIL PO) Take 1 capsule by mouth daily.     . rosuvastatin (CRESTOR) 10 MG tablet Take 5 mg by mouth daily.   2  . SYMBICORT 160-4.5 MCG/ACT inhaler Inhale 2 puffs into the lungs 2 (two) times daily.     No current facility-administered medications on file prior to visit.      Cardiovascular studies:  EKG 10/25/2018: Sinus rhythm 60 bpm. Incomplete RBBB.  Lexiscan myoview stress test 10/12/2017: 1. The resting electrocardiogram demonstrated normal sinus rhythm, normal resting conduction, no resting arrhythmias and normal rest repolarization. Stress EKG is nondiagnostic for ischemia as a pharmacologic stress test. 2. Myocardial perfusion imaging is normal. Overall left ventricular systolic function was normal without regional wall motion abnormalities. The left ventricular ejection fraction was 82%. 3. Low risk study.   CT Chest 09/25/2017: 1. Small nonspecific pulmonary nodules within the RIGHT lung, both measuring 4 mm with 1 likely corresponding to the recent chest x-ray finding, as well as chronic appearing nodular scarring/atelectasis along the pleura of the RIGHT upper lobe. Recommend follow-up noncontrast chest CT in 12 months to ensure stability of all findings. This recommendation follows the consensus statement: Guidelines for Management of Incidental Pulmonary Nodules Detected on CT Images: From the Fleischner Society 2017; Radiology 2017; 284:228-243. 2. Coronary artery calcifications, particularly dense within the LEFT main and LEFT anterior descending coronary arteries. Recommend correlation with any possible associated cardiac symptoms.  Recent labs: Lipid panel 11/01/2018: Cholesterol 169, triglycerides 83, HDL 79, LDL 75.  09/30/2017: Glucose 85. BUN/creatinine 12/0.67.  EGFR 86/104.  Sodium 140, potassium 4.7.  Rest of the CMP normal. Cholesterol 230, triglycerides 52, HDL 73, LDL 146   Review of Systems  Constitution: Negative for decreased appetite, malaise/fatigue, weight gain and weight loss.  HENT: Negative for congestion.   Eyes: Negative for visual disturbance.  Cardiovascular: Negative for chest pain, dyspnea on exertion, leg swelling, palpitations and syncope.  Respiratory: Negative for cough.   Endocrine: Negative for  cold intolerance.  Hematologic/Lymphatic: Does not bruise/bleed easily.  Skin: Negative for itching and rash.  Musculoskeletal: Negative for myalgias.  Gastrointestinal: Negative for abdominal pain, nausea and vomiting.  Genitourinary: Negative for dysuria.  Neurological: Negative for dizziness and weakness.  Psychiatric/Behavioral: The patient is not nervous/anxious.   All other systems reviewed and are negative.        Vitals:   11/15/18 1035  BP: (!) 140/42  Pulse: 65  Temp: (!) 93.3 F (34.1 C)  SpO2: 100%     Body mass index is 24.3 kg/m. Filed Weights   11/15/18 1035  Weight: 146 lb (66.2 kg)     Objective:   Physical Exam  Constitutional: She is oriented to person, place, and time. She appears well-developed and well-nourished. No distress.  HENT:  Head: Normocephalic and atraumatic.  Eyes: Pupils are equal, round, and reactive to light. Conjunctivae are normal.  Neck: No JVD present.  Cardiovascular: Normal rate, regular rhythm and intact distal pulses.  Murmur heard.  Early diastolic murmur is present with a grade of 1/6 at the upper right sternal border. Pulmonary/Chest: Effort normal and breath sounds normal. She has no wheezes. She has no rales.  Abdominal: Soft. Bowel sounds are normal. There is no rebound.  Musculoskeletal:        General: No edema.  Lymphadenopathy:    She has no cervical adenopathy.  Neurological: She is alert and oriented to person, place, and time. No cranial nerve deficit.  Skin: Skin is warm and dry.  Psychiatric: She has a normal mood and affect.  Nursing note and vitals reviewed.         Assessment & Recommendations:   78 y/o Caucasian female with hypertension and hyperlipidemia, incidental finding of left main and LAD calcified plaque, hip surgery 04/2017 owing to fall.  Coronary artery calcification: No angina symptoms. Nuclear stress test with no iscehmia, infarction (09/2017). Continue risk factor modification.  Continue aspirin 81 mg daily. LDL 75 and HDL 79 on rosuvastatin 5 mg daily. Continue the same. She could not tolerate higher dose due to nausea.  Elevated blood pressure reading without diagnosis of hypertension Better controlled. No antihypertensive therapy needed at this time. She does have very wide pulse pressure with a very soft early diastolic murmur. Will obtain echocardiogram to evaluate for aortic regurgitation.    [name], MD [facility] Pager: [phone] Office: [phone] If no answer Cell [phone]

## 2018-11-19 ENCOUNTER — Other Ambulatory Visit: Payer: Medicare Other

## 2018-11-19 ENCOUNTER — Other Ambulatory Visit: Payer: Self-pay

## 2018-11-19 ENCOUNTER — Ambulatory Visit
Admission: RE | Admit: 2018-11-19 | Discharge: 2018-11-19 | Disposition: A | Discharge status: Home / Self Care | Payer: Medicare Other | Source: Ambulatory Visit | Attending: Family Medicine | Admitting: Family Medicine

## 2018-11-19 DIAGNOSIS — R911 Solitary pulmonary nodule: Secondary | ICD-10-CM

## 2018-11-19 IMAGING — CT CT CHEST W/O CM
1 series · 13 of 34 positions shown, 17 images · non-contrast
Comparison: [DATE]
COMPARISON: [DATE]

Addendum:
CLINICAL DATA: Follow-up pulmonary nodule.

EXAM:
CT CHEST WITHOUT CONTRAST
TECHNIQUE: Multidetector CT imaging of the chest was performed following the
standard protocol without IV contrast.

[Series 2: chest w/(date) · axial · 0.64mm/px · z∈[+1005,+1279]mm · 13 of 161 slices shown, 17 images]
[im 12/161  mediastinal]
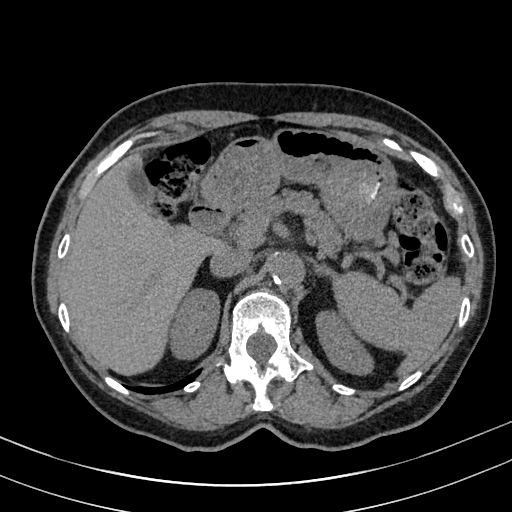
[im 12/161  lung]
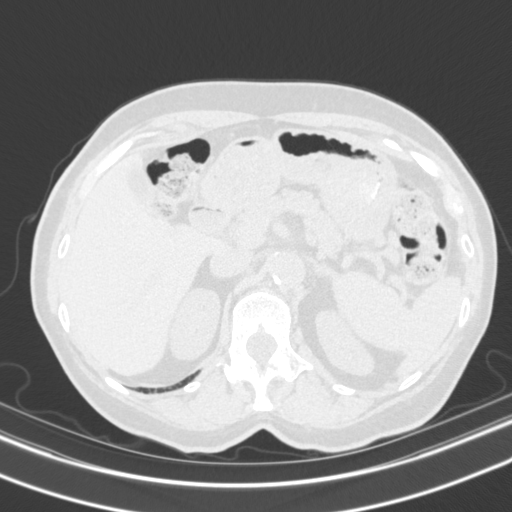
[im 24/161  lung]
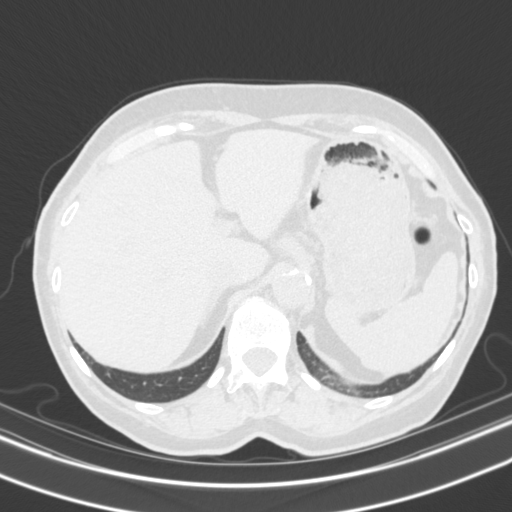
[im 36/161  lung]
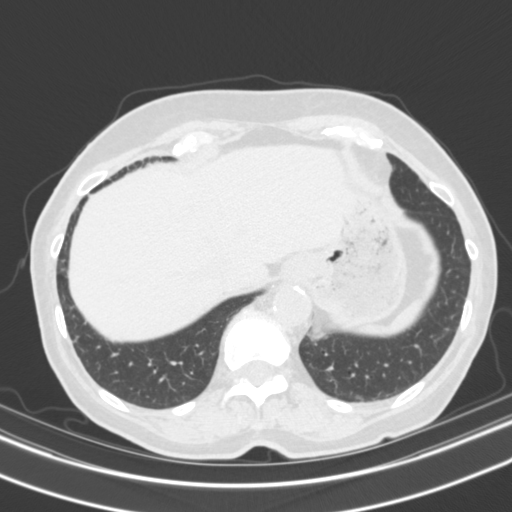
[im 48/161  lung]
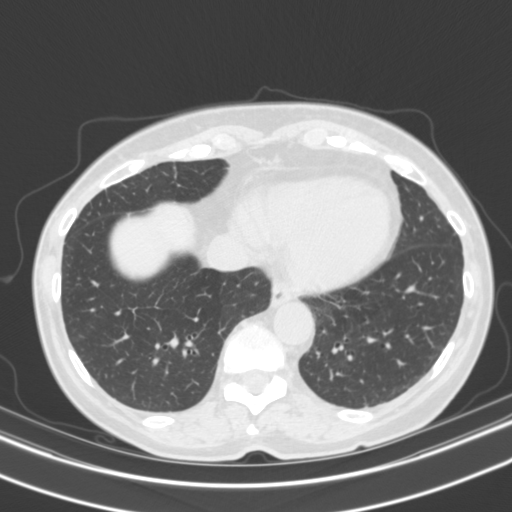
[im 65/161  mediastinal]
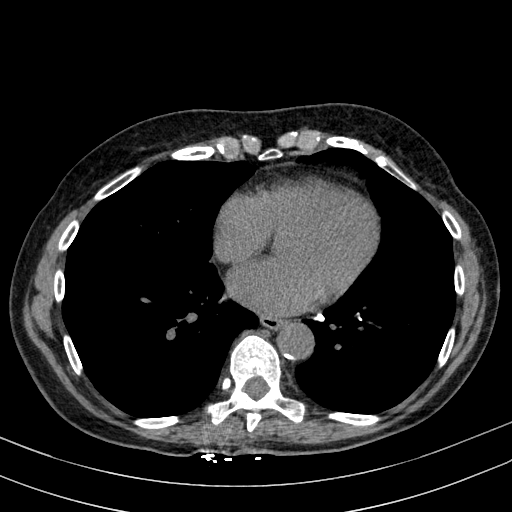
[im 65/161  lung]
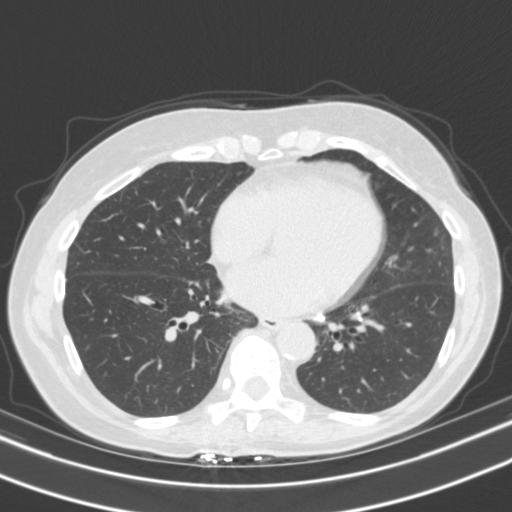
[im 72/161  lung]
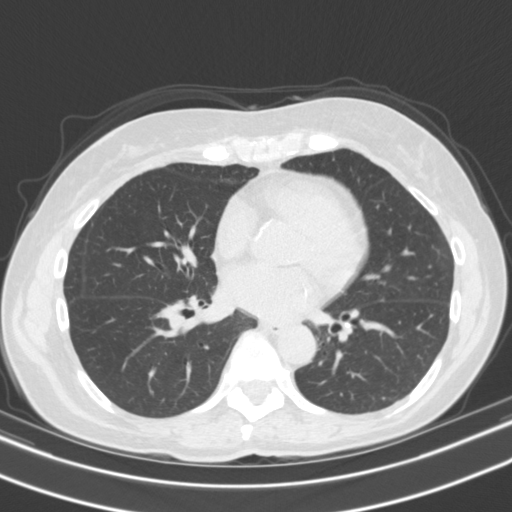
[im 83/161  lung]
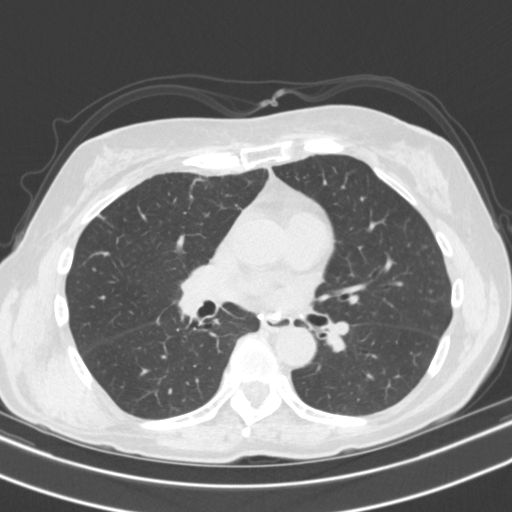
[im 89/161  lung]
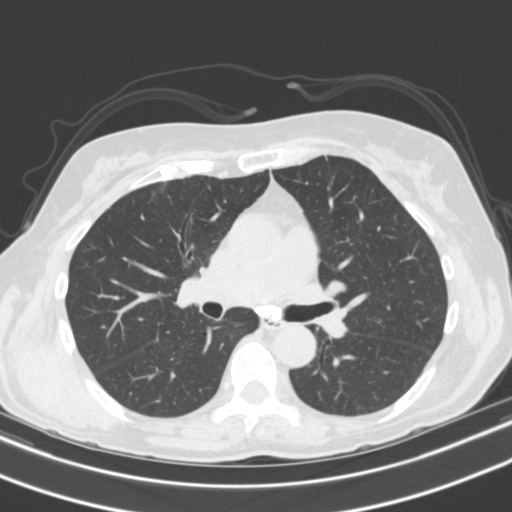
[im 97/161  mediastinal]
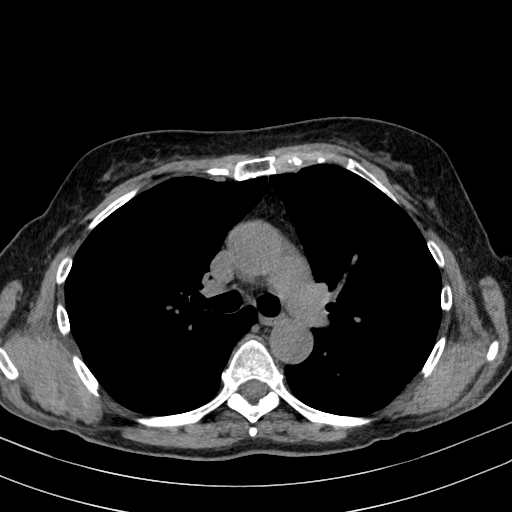
[im 97/161  lung]
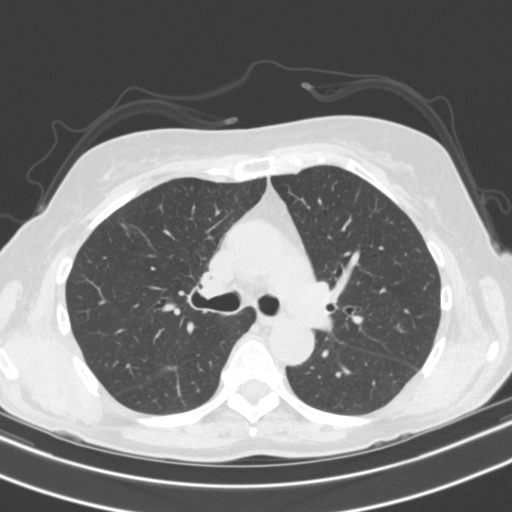
[im 113/161  lung]
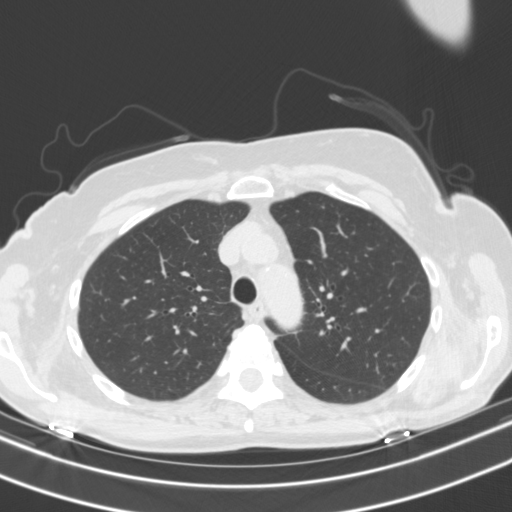
[im 125/161  lung]
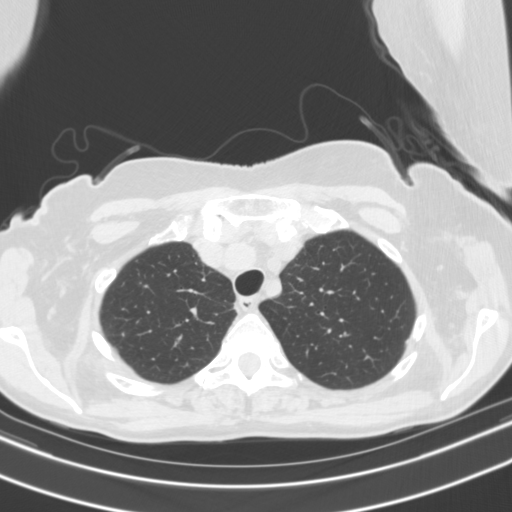
[im 137/161  lung]
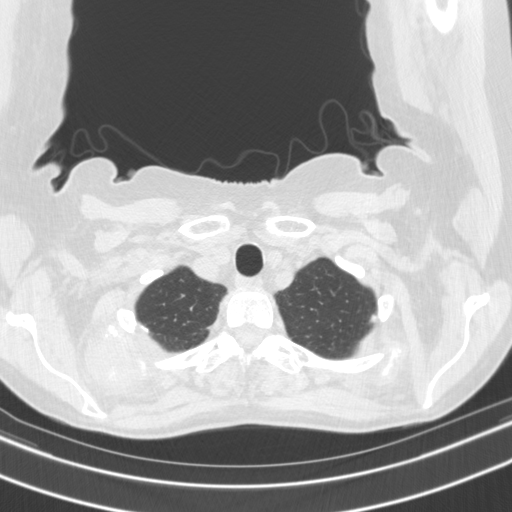
[im 149/161  mediastinal]
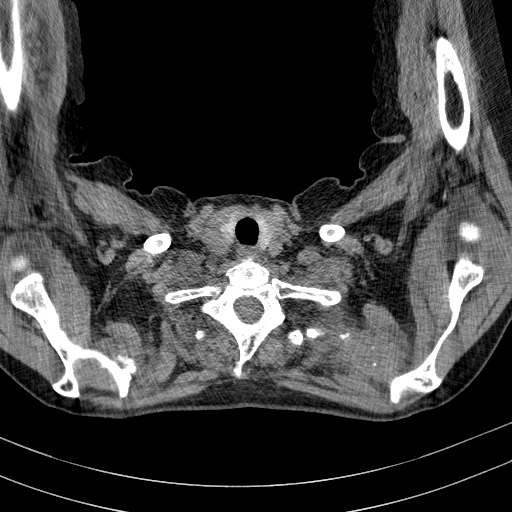
[im 149/161  lung]
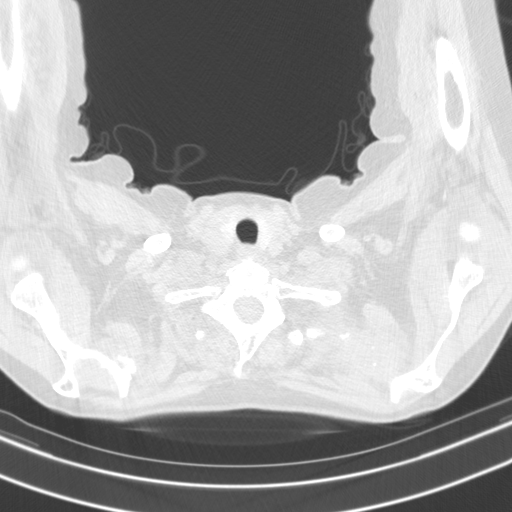

[13 of 34 positions shown; findings below may reference images not displayed]

FINDINGS: Cardiovascular: Calcified atherosclerotic changes throughout the
thoracic aorta. No signs of aneurysm. Limited assessment without IV
contrast. Calcified coronary artery disease. Heart size normal. No
pericardial effusion.

Central pulmonary vasculature is normal.

Mediastinum/Nodes: No signs of adenopathy in the chest. Thoracic
inlet structures are normal.

Lungs/Pleura: Calcified granulomas in the left lung base similar to
prior exam. No signs of pleural effusion or evidence of
consolidation. Stable small, partially calcified nodules elsewhere
in the chest.

Upper Abdomen: Incidental imaging of upper abdominal contents is
unremarkable.

Musculoskeletal: Bilateral lenticular areas in the subscapular
region beneath the serratus anterior musculature at the level of
thoracic inlet, and just below. On the left in area measures 3.6 by
3.0 cm, unchanged compared to previous exam with similar size though
slightly smaller on the right. There is calcification. No adjacent
bone destruction.

Inferior to this along the inferior margin of the scapula there are
bilateral lenticular areas with striated low-attenuation more
centrally measuring 5.0 x 3.0 cm on the right and 6.6 x 2.4 cm on
the left. These areas lack calcification.

No signs of bone destruction.
IMPRESSION: 1. Tiny area of nodularity in the right upper lobe seen on the prior
study has nearly resolved. Scattered pulmonary nodules indicative of
prior granulomatous disease are redemonstrated.
2. Signs of presumed fibroelastomas of the bilateral chest wall both
just below the thoracic inlet and just below the scapulae. These are
unchanged over years time. Consider follow-up in 1 year and or
correlation with symptoms related to scapular mobility and shoulder
mobility.

Aortic Atherosclerosis ([EG]-[EG]).

ADDENDUM:
The second item in conclusion should read these are unchanged over 1
year's time.

*** End of Addendum ***
FINDINGS: Cardiovascular: Calcified atherosclerotic changes throughout the
thoracic aorta. No signs of aneurysm. Limited assessment without IV
contrast. Calcified coronary artery disease. Heart size normal. No
pericardial effusion.

Central pulmonary vasculature is normal.

Mediastinum/Nodes: No signs of adenopathy in the chest. Thoracic
inlet structures are normal.

Lungs/Pleura: Calcified granulomas in the left lung base similar to
prior exam. No signs of pleural effusion or evidence of
consolidation. Stable small, partially calcified nodules elsewhere
in the chest.

Upper Abdomen: Incidental imaging of upper abdominal contents is
unremarkable.

Musculoskeletal: Bilateral lenticular areas in the subscapular
region beneath the serratus anterior musculature at the level of
thoracic inlet, and just below. On the left in area measures 3.6 by
3.0 cm, unchanged compared to previous exam with similar size though
slightly smaller on the right. There is calcification. No adjacent
bone destruction.

Inferior to this along the inferior margin of the scapula there are
bilateral lenticular areas with striated low-attenuation more
centrally measuring 5.0 x 3.0 cm on the right and 6.6 x 2.4 cm on
the left. These areas lack calcification.

No signs of bone destruction.
IMPRESSION: 1. Tiny area of nodularity in the right upper lobe seen on the prior
study has nearly resolved. Scattered pulmonary nodules indicative of
prior granulomatous disease are redemonstrated.
2. Signs of presumed fibroelastomas of the bilateral chest wall both
just below the thoracic inlet and just below the scapulae. These are
unchanged over years time. Consider follow-up in 1 year and or
correlation with symptoms related to scapular mobility and shoulder
mobility.

Aortic Atherosclerosis ([EG]-[EG]).

## 2018-11-24 ENCOUNTER — Other Ambulatory Visit: Payer: Self-pay

## 2018-11-24 ENCOUNTER — Ambulatory Visit (INDEPENDENT_AMBULATORY_CARE_PROVIDER_SITE_OTHER): Payer: Medicare Other

## 2018-11-24 DIAGNOSIS — R0989 Other specified symptoms and signs involving the circulatory and respiratory systems: Secondary | ICD-10-CM | POA: Diagnosis not present

## 2018-11-24 DIAGNOSIS — R011 Cardiac murmur, unspecified: Secondary | ICD-10-CM

## 2018-11-25 ENCOUNTER — Telehealth: Payer: Self-pay | Admitting: Cardiology

## 2018-11-25 ENCOUNTER — Other Ambulatory Visit: Payer: Self-pay | Admitting: Cardiology

## 2018-11-25 DIAGNOSIS — I351 Nonrheumatic aortic (valve) insufficiency: Secondary | ICD-10-CM

## 2018-11-25 NOTE — Telephone Encounter (Signed)
Discussed results with the patient. Asymptomatic moderate aortic regurgitation. Needs repeat echo in 6 months to monitor aortic regurgitation.

## 2018-12-03 DIAGNOSIS — M810 Age-related osteoporosis without current pathological fracture: Secondary | ICD-10-CM | POA: Diagnosis not present

## 2018-12-03 DIAGNOSIS — Z96642 Presence of left artificial hip joint: Secondary | ICD-10-CM | POA: Diagnosis not present

## 2018-12-03 DIAGNOSIS — Z8262 Family history of osteoporosis: Secondary | ICD-10-CM | POA: Diagnosis not present

## 2018-12-14 ENCOUNTER — Other Ambulatory Visit: Payer: Medicare Other

## 2018-12-16 DIAGNOSIS — Z6823 Body mass index (BMI) 23.0-23.9, adult: Secondary | ICD-10-CM | POA: Diagnosis not present

## 2018-12-16 DIAGNOSIS — M353 Polymyalgia rheumatica: Secondary | ICD-10-CM | POA: Diagnosis not present

## 2018-12-16 DIAGNOSIS — M8000XD Age-related osteoporosis with current pathological fracture, unspecified site, subsequent encounter for fracture with routine healing: Secondary | ICD-10-CM | POA: Diagnosis not present

## 2019-01-29 ENCOUNTER — Ambulatory Visit: Payer: Medicare Other | Attending: Internal Medicine

## 2019-01-29 DIAGNOSIS — Z23 Encounter for immunization: Secondary | ICD-10-CM | POA: Insufficient documentation

## 2019-01-29 NOTE — Progress Notes (Signed)
Covid-19 Vaccination Clinic  Name:  CHEYNE PEALE    MRN: [id number] DOB: 08/09/1940  01/29/2019  Ms. Cieslak was observed post Covid-19 immunization for 15 minutes without incidence. She was provided with Vaccine Information Sheet and instruction to access the V-Safe system.   Ms. Merenda was instructed to call 911 with any severe reactions post vaccine: Marland Kitchen Difficulty breathing  . Swelling of your face and throat  . A fast heartbeat  . A bad rash all over your body  . Dizziness and weakness    Immunizations Administered    Name Date Dose VIS Date Route   Pfizer COVID-19 Vaccine 01/29/2019 11:50 AM 0.3 mL 12/24/2018 Intramuscular   Manufacturer: [employer], [employer]   Lot: [id number]   NDC: [address]-6

## 2019-02-18 ENCOUNTER — Ambulatory Visit: Payer: Medicare Other | Attending: Internal Medicine

## 2019-02-18 DIAGNOSIS — Z23 Encounter for immunization: Secondary | ICD-10-CM | POA: Insufficient documentation

## 2019-02-18 NOTE — Progress Notes (Signed)
Covid-19 Vaccination Clinic  Name:  Angela [name]    MRN: [id number] DOB: 06/02/1940  02/18/2019  [name] was observed post Covid-19 immunization for 15 minutes without incidence. She was provided with Vaccine Information Sheet and instruction to access the V-Safe system.   [name] was instructed to call 911 with any severe reactions post vaccine: Marland Kitchen Difficulty breathing  . Swelling of your face and throat  . A fast heartbeat  . A bad rash all over your body  . Dizziness and weakness    Immunizations Administered    Name Date Dose VIS Date Route   Pfizer COVID-19 Vaccine 02/18/2019  8:40 AM 0.3 mL 12/24/2018 Intramuscular   Manufacturer: [employer], [employer]   Lot: [id number]   NDC: [address]-2

## 2019-03-09 DIAGNOSIS — M810 Age-related osteoporosis without current pathological fracture: Secondary | ICD-10-CM | POA: Diagnosis not present

## 2019-04-04 DIAGNOSIS — B029 Zoster without complications: Secondary | ICD-10-CM | POA: Diagnosis not present

## 2019-04-05 ENCOUNTER — Encounter: Payer: Self-pay | Admitting: Certified Nurse Midwife

## 2019-04-22 DIAGNOSIS — M25511 Pain in right shoulder: Secondary | ICD-10-CM | POA: Diagnosis not present

## 2019-05-02 ENCOUNTER — Other Ambulatory Visit: Payer: Self-pay

## 2019-05-02 ENCOUNTER — Ambulatory Visit: Payer: Medicare Other

## 2019-05-02 DIAGNOSIS — I351 Nonrheumatic aortic (valve) insufficiency: Secondary | ICD-10-CM

## 2019-05-10 NOTE — Progress Notes (Signed)
Called and spoke with patient regarding her echocardiogram results.

## 2019-05-16 ENCOUNTER — Ambulatory Visit: Payer: Medicare Other | Admitting: Cardiology

## 2019-05-28 NOTE — Progress Notes (Signed)
Follow up visit  Subjective:   Angela [name], female    DOB: 08-06-1940, 79 y.o.   MRN: [id number]   HPI  Chief Complaint  Patient presents with  . Coronary artery calcification  . Follow-up    6 month    79 y.o. Caucasian female with hypertension, hyperlipidemia, coronary atherosclerosis, [name]  Patient is doing well and denies chest pain, shortness of breath, palpitations, leg edema, orthopnea, PND, TIA/syncope.  She noticed headache, associated blood pressure spikes into the 180s after her second Covid vaccine.  She has not checked her blood pressure since then. While she does not have any symptoms today, blood pressure remains elevated.  She is reluctant to start antihypertensive therapy.    Current Outpatient Medications on File Prior to Visit  Medication Sig Dispense Refill  . aspirin EC 81 MG tablet Take by mouth.    . Cholecalciferol (VITAMIN D-3 PO) Take 1 capsule by mouth daily.    Marland Kitchen denosumab (PROLIA) 60 MG/ML SOSY injection Inject 60 mg into the skin.    . Multiple Vitamins-Minerals (MULTIVITAMIN PO) Take 1 tablet by mouth daily.     . Omega-3 Fatty Acids (FISH OIL PO) Take 1 capsule by mouth daily.     . rosuvastatin (CRESTOR) 5 MG tablet Take 1 tablet (5 mg total) by mouth daily. (Patient taking differently: Take 5 mg by mouth daily. Every other day) 90 tablet 3  . SYMBICORT 160-4.5 MCG/ACT inhaler Inhale 2 puffs into the lungs 2 (two) times daily.     No current facility-administered medications on file prior to visit.    Cardiovascular & other pertient studies:  EKG 05/30/2019: Sinus rhythm 63 bpm.  Nonspecific ST-T changes anteroseptal leads.  Echocardiogram 05/02/2019:  Normal LV systolic function with visual EF 60-65%. Left ventricle cavity is normal in size. Normal global wall motion.  Normal diastolic filling pattern, normal LAP. Calculated EF 61%.  Mild (Grade I) aortic regurgitation.  Mild (Grade I) mitral regurgitation.  No prior study for  comparison.  Lexiscan myoview stress test 10/12/2017: 1. The resting electrocardiogram demonstrated normal sinus rhythm, normal resting conduction, no resting arrhythmias and normal rest repolarization. Stress EKG is nondiagnostic for ischemia as a pharmacologic stress test. 2. Myocardial perfusion imaging is normal. Overall left ventricular systolic function was normal without regional wall motion abnormalities. The left ventricular ejection fraction was 82%. 3. Low risk study.   CT Chest 09/25/2017: 1. Small nonspecific pulmonary nodules within the RIGHT lung, both measuring 4 mm with 1 likely corresponding to the recent chest x-ray finding, as well as chronic appearing nodular scarring/atelectasis along the pleura of the RIGHT upper lobe.  Recommend follow-up noncontrast chest CT in 12 months to ensure stability of all findings. 2. Coronary artery calcifications, particularly dense within the LEFT main and LEFT anterior descending coronary arteries.  Recommend correlation with any possible associated cardiac symptoms.  Recent labs:  Lipid panel 11/01/2018: Cholesterol 169, triglycerides 83, HDL 79, LDL 75.  09/30/2017: Glucose 85. BUN/creatinine 12/0.67.  EGFR 86/104.  Sodium 140, potassium 4.7.  Rest of the CMP normal. Cholesterol 230, triglycerides 52, HDL 73, LDL 146    Review of Systems  Cardiovascular: Negative for chest pain, dyspnea on exertion, leg swelling, palpitations and syncope.         Vitals:   05/30/19 1244  BP: (!) 166/51  Pulse: 60  Resp: 16  Temp: (!) 97.2 F (36.2 C)  SpO2: 99%    Body mass index is 23.91 kg/m. Filed  Weights   05/30/19 1244  Weight: 143 lb 11.2 oz (65.2 kg)     Objective:   Physical Exam  Constitutional: No distress.  Neck: No JVD present.  Cardiovascular: Normal rate, regular rhythm and intact distal pulses.  Murmur heard.  Early diastolic murmur is present with a grade of 1/6 at the upper right sternal  border. Pulmonary/Chest: Effort normal and breath sounds normal. She has no wheezes. She has no rales.  Musculoskeletal:        General: No edema.  Nursing note and vitals reviewed.         Assessment & Recommendations:   79 y.o. Caucasian female with hypertension, hyperlipidemia, coronary atherosclerosis, [name]  Coronary artery calcification: No angina symptoms. Nuclear stress test with no iscehmia, infarction (09/2017). Continue risk factor modification. Continue aspirin 81 mg daily. LDL 75 and HDL 79 on rosuvastatin 5 mg daily. Continue the same. She could not tolerate higher dose due to nausea.  Hypertension: Reluctant to start antihypertensive therapy. Recommend remote patient monitoring to collect data over 4-6 weeks. Will readdress at next visit.  Mild AI, MR: Clinically asymptomatic    [name], MD [facility] Pager: [phone] Office: [phone]

## 2019-05-30 ENCOUNTER — Other Ambulatory Visit: Payer: Self-pay

## 2019-05-30 ENCOUNTER — Ambulatory Visit: Payer: Medicare Other | Admitting: Cardiology

## 2019-05-30 ENCOUNTER — Encounter: Payer: Self-pay | Admitting: Cardiology

## 2019-05-30 VITALS — BP 166/51 | HR 60 | Temp 97.2°F | Resp 16 | Ht 65.0 in | Wt 143.7 lb

## 2019-05-30 DIAGNOSIS — I10 Essential (primary) hypertension: Secondary | ICD-10-CM

## 2019-05-30 DIAGNOSIS — I2584 Coronary atherosclerosis due to calcified coronary lesion: Secondary | ICD-10-CM

## 2019-05-30 DIAGNOSIS — I2510 Atherosclerotic heart disease of native coronary artery without angina pectoris: Secondary | ICD-10-CM | POA: Diagnosis not present

## 2019-06-01 DIAGNOSIS — I10 Essential (primary) hypertension: Secondary | ICD-10-CM | POA: Diagnosis not present

## 2019-06-10 DIAGNOSIS — I10 Essential (primary) hypertension: Secondary | ICD-10-CM | POA: Diagnosis not present

## 2019-06-20 DIAGNOSIS — H04123 Dry eye syndrome of bilateral lacrimal glands: Secondary | ICD-10-CM | POA: Diagnosis not present

## 2019-06-20 DIAGNOSIS — H02403 Unspecified ptosis of bilateral eyelids: Secondary | ICD-10-CM | POA: Diagnosis not present

## 2019-06-20 DIAGNOSIS — H52203 Unspecified astigmatism, bilateral: Secondary | ICD-10-CM | POA: Diagnosis not present

## 2019-06-20 DIAGNOSIS — D3132 Benign neoplasm of left choroid: Secondary | ICD-10-CM | POA: Diagnosis not present

## 2019-07-13 DIAGNOSIS — I10 Essential (primary) hypertension: Secondary | ICD-10-CM | POA: Diagnosis not present

## 2019-07-26 DIAGNOSIS — H903 Sensorineural hearing loss, bilateral: Secondary | ICD-10-CM | POA: Diagnosis not present

## 2019-08-04 ENCOUNTER — Ambulatory Visit: Payer: Medicare Other | Admitting: Cardiology

## 2019-08-05 ENCOUNTER — Ambulatory Visit: Payer: Medicare Other | Admitting: Cardiology

## 2019-08-13 DIAGNOSIS — I10 Essential (primary) hypertension: Secondary | ICD-10-CM | POA: Diagnosis not present

## 2019-08-15 ENCOUNTER — Encounter: Payer: Self-pay | Admitting: Obstetrics & Gynecology

## 2019-08-17 ENCOUNTER — Ambulatory Visit: Payer: Medicare Other | Admitting: Cardiology

## 2019-08-18 DIAGNOSIS — L570 Actinic keratosis: Secondary | ICD-10-CM | POA: Diagnosis not present

## 2019-08-18 DIAGNOSIS — L578 Other skin changes due to chronic exposure to nonionizing radiation: Secondary | ICD-10-CM | POA: Diagnosis not present

## 2019-08-18 DIAGNOSIS — D225 Melanocytic nevi of trunk: Secondary | ICD-10-CM | POA: Diagnosis not present

## 2019-08-18 DIAGNOSIS — Z85828 Personal history of other malignant neoplasm of skin: Secondary | ICD-10-CM | POA: Diagnosis not present

## 2019-08-18 DIAGNOSIS — L814 Other melanin hyperpigmentation: Secondary | ICD-10-CM | POA: Diagnosis not present

## 2019-08-18 DIAGNOSIS — L821 Other seborrheic keratosis: Secondary | ICD-10-CM | POA: Diagnosis not present

## 2019-08-24 ENCOUNTER — Ambulatory Visit: Payer: Medicare Other | Admitting: Cardiology

## 2019-08-24 NOTE — Progress Notes (Deleted)
Follow up visit  Subjective:   Angela [name], female    DOB: 08-Jun-1940, 79 y.o.   MRN: [id number]   HPI  No chief complaint on file.   79 y.o. Caucasian female with hypertension, hyperlipidemia, coronary atherosclerosis, [name]  Patient is doing well and denies chest pain, shortness of breath, palpitations, leg edema, orthopnea, PND, TIA/syncope.  She noticed headache, associated blood pressure spikes into the 180s after her second Covid vaccine.  She has not checked her blood pressure since then. While she does not have any symptoms today, blood pressure remains elevated.  She is reluctant to start antihypertensive therapy.    Current Outpatient Medications on File Prior to Visit  Medication Sig Dispense Refill  . aspirin EC 81 MG tablet Take by mouth.    . Cholecalciferol (VITAMIN D-3 PO) Take 1 capsule by mouth daily.    Marland Kitchen denosumab (PROLIA) 60 MG/ML SOSY injection Inject 60 mg into the skin.    . Multiple Vitamins-Minerals (MULTIVITAMIN PO) Take 1 tablet by mouth daily.     . Omega-3 Fatty Acids (FISH OIL PO) Take 1 capsule by mouth daily.     . rosuvastatin (CRESTOR) 5 MG tablet Take 1 tablet (5 mg total) by mouth daily. (Patient taking differently: Take 5 mg by mouth daily. Every other day) 90 tablet 3  . SYMBICORT 160-4.5 MCG/ACT inhaler Inhale 2 puffs into the lungs 2 (two) times daily.     No current facility-administered medications on file prior to visit.    Cardiovascular & other pertient studies:  EKG 05/30/2019: Sinus rhythm 63 bpm.  Nonspecific ST-T changes anteroseptal leads.  Echocardiogram 05/02/2019:  Normal LV systolic function with visual EF 60-65%. Left ventricle cavity is normal in size. Normal global wall motion.  Normal diastolic filling pattern, normal LAP. Calculated EF 61%.  Mild (Grade I) aortic regurgitation.  Mild (Grade I) mitral regurgitation.  No prior study for comparison.  Lexiscan myoview stress test 10/12/2017: 1. The resting  electrocardiogram demonstrated normal sinus rhythm, normal resting conduction, no resting arrhythmias and normal rest repolarization. Stress EKG is nondiagnostic for ischemia as a pharmacologic stress test. 2. Myocardial perfusion imaging is normal. Overall left ventricular systolic function was normal without regional wall motion abnormalities. The left ventricular ejection fraction was 82%. 3. Low risk study.   CT Chest 09/25/2017: 1. Small nonspecific pulmonary nodules within the RIGHT lung, both measuring 4 mm with 1 likely corresponding to the recent chest x-ray finding, as well as chronic appearing nodular scarring/atelectasis along the pleura of the RIGHT upper lobe.  Recommend follow-up noncontrast chest CT in 12 months to ensure stability of all findings. 2. Coronary artery calcifications, particularly dense within the LEFT main and LEFT anterior descending coronary arteries.  Recommend correlation with any possible associated cardiac symptoms.  Recent labs:  Lipid panel 11/01/2018: Cholesterol 169, triglycerides 83, HDL 79, LDL 75.  09/30/2017: Glucose 85. BUN/creatinine 12/0.67.  EGFR 86/104.  Sodium 140, potassium 4.7.  Rest of the CMP normal. Cholesterol 230, triglycerides 52, HDL 73, LDL 146    Review of Systems  Cardiovascular: Negative for chest pain, dyspnea on exertion, leg swelling, palpitations and syncope.         There were no vitals filed for this visit.  There is no height or weight on file to calculate BMI. There were no vitals filed for this visit.   Objective:   Physical Exam Vitals and nursing note reviewed.  Constitutional:      General: She is  not in acute distress. Neck:     Vascular: No JVD.  Cardiovascular:     Rate and Rhythm: Normal rate and regular rhythm.     Pulses: Intact distal pulses.     Heart sounds: Murmur heard.  Early diastolic murmur is present with a grade of 1/4 at the upper right sternal border.   Pulmonary:      Effort: Pulmonary effort is normal.     Breath sounds: Normal breath sounds. No wheezing or rales.           Assessment & Recommendations:   79 y.o. Caucasian female with hypertension, hyperlipidemia, coronary atherosclerosis, [name]  Coronary artery calcification: No angina symptoms. Nuclear stress test with no iscehmia, infarction (09/2017). Continue risk factor modification. Continue aspirin 81 mg daily. LDL 75 and HDL 79 on rosuvastatin 5 mg daily. Continue the same. She could not tolerate higher dose due to nausea.  Hypertension: Reluctant to start antihypertensive therapy. Recommend remote patient monitoring to collect data over 4-6 weeks. Will readdress at next visit.  Mild AI, MR: Clinically asymptomatic    [name], MD [facility] Pager: [phone] Office: [phone]

## 2019-08-31 DIAGNOSIS — H9193 Unspecified hearing loss, bilateral: Secondary | ICD-10-CM | POA: Diagnosis not present

## 2019-08-31 DIAGNOSIS — Z0000 Encounter for general adult medical examination without abnormal findings: Secondary | ICD-10-CM | POA: Diagnosis not present

## 2019-08-31 DIAGNOSIS — M810 Age-related osteoporosis without current pathological fracture: Secondary | ICD-10-CM | POA: Diagnosis not present

## 2019-08-31 DIAGNOSIS — I700 Atherosclerosis of aorta: Secondary | ICD-10-CM | POA: Diagnosis not present

## 2019-08-31 DIAGNOSIS — E7800 Pure hypercholesterolemia, unspecified: Secondary | ICD-10-CM | POA: Diagnosis not present

## 2019-08-31 DIAGNOSIS — I2510 Atherosclerotic heart disease of native coronary artery without angina pectoris: Secondary | ICD-10-CM | POA: Diagnosis not present

## 2019-08-31 DIAGNOSIS — Z6823 Body mass index (BMI) 23.0-23.9, adult: Secondary | ICD-10-CM | POA: Diagnosis not present

## 2019-09-14 DIAGNOSIS — M810 Age-related osteoporosis without current pathological fracture: Secondary | ICD-10-CM | POA: Diagnosis not present

## 2019-09-15 ENCOUNTER — Encounter: Payer: Self-pay | Admitting: Cardiology

## 2019-09-15 ENCOUNTER — Other Ambulatory Visit: Payer: Self-pay

## 2019-09-15 ENCOUNTER — Ambulatory Visit: Payer: Medicare Other | Admitting: Cardiology

## 2019-09-15 VITALS — BP 167/41 | HR 59 | Resp 16 | Ht 65.0 in | Wt 146.0 lb

## 2019-09-15 DIAGNOSIS — Z789 Other specified health status: Secondary | ICD-10-CM | POA: Insufficient documentation

## 2019-09-15 DIAGNOSIS — I2584 Coronary atherosclerosis due to calcified coronary lesion: Secondary | ICD-10-CM

## 2019-09-15 DIAGNOSIS — E782 Mixed hyperlipidemia: Secondary | ICD-10-CM | POA: Diagnosis not present

## 2019-09-15 DIAGNOSIS — I2510 Atherosclerotic heart disease of native coronary artery without angina pectoris: Secondary | ICD-10-CM | POA: Diagnosis not present

## 2019-09-15 MED ORDER — REPATHA 140 MG/ML ~~LOC~~ SOSY: 140.0000 mg | PREFILLED_SYRINGE | SUBCUTANEOUS | 6 refills | Status: DC

## 2019-09-15 NOTE — Progress Notes (Signed)
Follow up visit  Subjective:   Angela [name], female    DOB: 27-Jan-1940, 79 y.o.   MRN: [id number]   HPI   Chief Complaint  Patient presents with  . Hypertension  . Hyperlipidemia  . Follow-up    79 y.o. Caucasian female with hypertension, hyperlipidemia, coronary atherosclerosis, [name]  Blood pressures have been well controled at home, avg BP 120/59 avg, HR 64. Recent lipid panel is unfavorable, details below. She has not tolerated even low dose statins due to myalgias.   Current Outpatient Medications on File Prior to Visit  Medication Sig Dispense Refill  . aspirin EC 81 MG tablet Take by mouth.    . Cholecalciferol (VITAMIN D-3 PO) Take 1 capsule by mouth daily.    Marland Kitchen denosumab (PROLIA) 60 MG/ML SOSY injection Inject 60 mg into the skin.    . Multiple Vitamins-Minerals (MULTIVITAMIN PO) Take 1 tablet by mouth daily.     . Omega-3 Fatty Acids (FISH OIL PO) Take 1 capsule by mouth daily.     . rosuvastatin (CRESTOR) 5 MG tablet Take 1 tablet (5 mg total) by mouth daily. (Patient taking differently: Take 5 mg by mouth daily. Every other day) 90 tablet 3  . SYMBICORT 160-4.5 MCG/ACT inhaler Inhale 2 puffs into the lungs 2 (two) times daily.     No current facility-administered medications on file prior to visit.    Cardiovascular & other pertient studies:  EKG 05/30/2019: Sinus rhythm 63 bpm.  Nonspecific ST-T changes anteroseptal leads.  Echocardiogram 05/02/2019:  Normal LV systolic function with visual EF 60-65%. Left ventricle cavity is normal in size. Normal global wall motion.  Normal diastolic filling pattern, normal LAP. Calculated EF 61%.  Mild (Grade I) aortic regurgitation.  Mild (Grade I) mitral regurgitation.  No prior study for comparison.  Lexiscan myoview stress test 10/12/2017: 1. The resting electrocardiogram demonstrated normal sinus rhythm, normal resting conduction, no resting arrhythmias and normal rest repolarization. Stress EKG is  nondiagnostic for ischemia as a pharmacologic stress test. 2. Myocardial perfusion imaging is normal. Overall left ventricular systolic function was normal without regional wall motion abnormalities. The left ventricular ejection fraction was 82%. 3. Low risk study.   CT Chest 09/25/2017: 1. Small nonspecific pulmonary nodules within the RIGHT lung, both measuring 4 mm with 1 likely corresponding to the recent chest x-ray finding, as well as chronic appearing nodular scarring/atelectasis along the pleura of the RIGHT upper lobe.  Recommend follow-up noncontrast chest CT in 12 months to ensure stability of all findings. 2. Coronary artery calcifications, particularly dense within the LEFT main and LEFT anterior descending coronary arteries.  Recommend correlation with any possible associated cardiac symptoms.  Recent labs: 08/31/2019: Glucose 81, BUN/Cr 15/0.7. EGFR 85 Chol 265, TG 140, HDL 75, LDL 165   Lipid panel 11/01/2018: Cholesterol 169, triglycerides 83, HDL 79, LDL 75.  09/30/2017: Glucose 85. BUN/creatinine 12/0.67.  EGFR 86/104.  Sodium 140, potassium 4.7.  Rest of the CMP normal. Cholesterol 230, triglycerides 52, HDL 73, LDL 146    Review of Systems  Cardiovascular: Negative for chest pain, dyspnea on exertion, leg swelling, palpitations and syncope.         Vitals:   09/15/19 1004  BP: (!) 167/41  Pulse: (!) 59  Resp: 16  SpO2: 99%    Body mass index is 24.3 kg/m. Filed Weights   09/15/19 1004  Weight: 146 lb (66.2 kg)     Objective:   Physical Exam Vitals and nursing note reviewed.  Constitutional:      General: She is not in acute distress. Neck:     Vascular: No JVD.  Cardiovascular:     Rate and Rhythm: Normal rate and regular rhythm.     Pulses: Intact distal pulses.     Heart sounds: Murmur heard.  Early diastolic murmur is present with a grade of 1/4 at the upper right sternal border.   Pulmonary:     Effort: Pulmonary effort is  normal.     Breath sounds: Normal breath sounds. No wheezing or rales.         Assessment & Recommendations:   79 y.o. Caucasian female with hypertension, hyperlipidemia, coronary atherosclerosis, [name]  Coronary artery calcification: No angina symptoms. Nuclear stress test with no iscehmia, infarction (09/2017). Continue risk factor modification. Continue aspirin 81 mg daily. LDL 165 without statin. She is responsive, but intolerant to statins or Zetia due to myalgias. Recommend Repatha 140 mg every 2 weeks Will repeat lipid panel in 3 months  White coat hypertension: Continue home monitoring for now.  Mild AI, MR: Clinically asymptomatic  F/u in 01/2020   [name], MD [facility] [place] Pager: [phone] Office: [phone]

## 2019-10-03 ENCOUNTER — Other Ambulatory Visit: Payer: Self-pay

## 2019-10-03 DIAGNOSIS — E782 Mixed hyperlipidemia: Secondary | ICD-10-CM

## 2019-10-03 DIAGNOSIS — I2510 Atherosclerotic heart disease of native coronary artery without angina pectoris: Secondary | ICD-10-CM

## 2019-10-03 DIAGNOSIS — Z789 Other specified health status: Secondary | ICD-10-CM

## 2019-10-03 NOTE — Telephone Encounter (Signed)
Please see message above and advise thank you

## 2019-10-04 NOTE — Telephone Encounter (Signed)
I am working on it 

## 2019-10-05 DIAGNOSIS — L859 Epidermal thickening, unspecified: Secondary | ICD-10-CM | POA: Diagnosis not present

## 2019-10-05 DIAGNOSIS — L988 Other specified disorders of the skin and subcutaneous tissue: Secondary | ICD-10-CM | POA: Diagnosis not present

## 2019-10-05 DIAGNOSIS — D485 Neoplasm of uncertain behavior of skin: Secondary | ICD-10-CM | POA: Diagnosis not present

## 2019-10-13 NOTE — Telephone Encounter (Signed)
Called pt no answer, left a vm

## 2019-10-14 ENCOUNTER — Telehealth: Payer: Self-pay

## 2019-10-14 NOTE — Telephone Encounter (Signed)
done

## 2019-10-14 NOTE — Telephone Encounter (Signed)
New PA sent to [facility] (Key: [id number]). [employer] may prefer Purulent as the preferred alternative. Will update once Repatha PA is reviewed.

## 2019-10-17 MED ORDER — PRALUENT 75 MG/ML ~~LOC~~ SOAJ: 75.0000 mg | SUBCUTANEOUS | 90 refills | Status: DC

## 2019-10-17 NOTE — Telephone Encounter (Addendum)
Repatha PA denied. Praluent preferred. Will start a new PA. Praulent Rx pended for Dr. Virgina Jock.    Praluent PA started on covermymeds (Key: [id number]). Called and reviewed with pt. Pt verbalized understanding that repatha was rejected and praluent was preferred per pr's insurance. Reviewed administration directions, dosing schedule, MOA, ADRs with pt. Pt agreeable with proceeding with Praluent.

## 2019-10-18 ENCOUNTER — Other Ambulatory Visit: Payer: Self-pay | Admitting: Pharmacist

## 2019-10-18 DIAGNOSIS — I2584 Coronary atherosclerosis due to calcified coronary lesion: Secondary | ICD-10-CM

## 2019-10-18 DIAGNOSIS — I2510 Atherosclerotic heart disease of native coronary artery without angina pectoris: Secondary | ICD-10-CM

## 2019-10-18 DIAGNOSIS — E782 Mixed hyperlipidemia: Secondary | ICD-10-CM

## 2019-10-18 NOTE — Progress Notes (Signed)
Repatha not on pt's formulary, praluent preferred. PA completed for Praluent and approved. Cost of Praluent per pt's pharmacy was $1900/3 month or ~$700/1 month supply. Pt has previously tried Crestor but had ADR c/o of feeling "fuzzy" and "having mood changes". Already tired decreased dose and QOD dosing with no improvement in symptoms. Pt also has previously tried atorvastatin and Zetia but had complains of nausea associated with them. Denies any complains of myopathy or myalgia in the past with prior statin use. Pt doesn't recollect being on pravastatin in the past. Pt willing to have a trial of pravastatin to see if she is able to tolerate it better. Reviewed with Dr. Virgina Jock. Pended Rx. Pt verbalized understanding and willing to proceed with pravastatin.

## 2019-10-19 DIAGNOSIS — Z23 Encounter for immunization: Secondary | ICD-10-CM | POA: Diagnosis not present

## 2019-10-21 MED ORDER — PRAVASTATIN SODIUM 80 MG PO TABS: 80.0000 mg | ORAL_TABLET | Freq: Every evening | ORAL | 2 refills | Status: DC

## 2019-11-08 NOTE — Progress Notes (Signed)
79 y.o. G3P3 Married White or Caucasian female here for breast and pelvic exam.  Denies vaginal bleeding.  She wants to discuss covid booster today.  She had elevated BP after the second dose.  She has questions and we discussed options.  She may want to consider the moderna as her booster due to this being a half dose so less risk of reaction.    Pt is being followed by Dr. Sabra Heck, PCP, and Dr. Virgina Jock, cardiologist.  She has coronary calcifications.  Her cholesterol is elevated--total was 265 and LDLs were 165.  She has experienced some issues/side effects with statins.  She is on pravachol and is tolerating this well.  It's not going to help as much as some of the other ones but this is what she can tolerate.  Last echo was in April, 2021.  Has follow up scheduled in December.  Is on Prolia.  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  Last BMD was 11/2018.  Appt is scheduled in December with him.   Patient's last menstrual period was 01/13/1993.          Sexually active: No.  H/O STD:  no  Health Maintenance: PCP:  Kathyrn Lass  Last wellness appt was august 2021.  Did blood work at that appt:  yes Vaccines are up to date:  Yes except she hasn't done the  Colonoscopy:  7/16 normal MMG:  07-19-2919 category b density birads 1:neg BMD:  12-03-2018, another one scheduled for 11/2019  Last pap smear:  03-05-17 neg H/o abnormal pap smear:  no   reports that she has never smoked. She has never used smokeless tobacco. She reports previous alcohol use. She reports that she does not use drugs.  Past Medical History:  Diagnosis Date  . Carpal tunnel syndrome on right 10/08  . Elevated cholesterol 5/93  . Nephrolithiasis 2010  . Osteoporosis   . Polymyalgia ([place]) 2/97    Past Surgical History:  Procedure Laterality Date  . BLEPHAROPLASTY Bilateral 2/14   Dr. Isidoro Donning  . CARPAL TUNNEL RELEASE Right 6/08  . CERVICAL DISCECTOMY  12/91  . KNEE SURGERY Left   . spots removed     frozen  and biopsied-all benign  . TONSILLECTOMY AND ADENOIDECTOMY    . 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 Cholecalciferol (VITAMIN D-3 PO) Take 1 capsule by mouth daily.    Marland Kitchen denosumab (PROLIA) 60 MG/ML SOSY injection Inject 60 mg into the skin.    . Multiple Vitamins-Minerals (MULTIVITAMIN PO) Take 1 tablet by mouth daily.     . Omega-3 Fatty Acids (FISH OIL PO) Take 1 capsule by mouth daily.     . pravastatin (PRAVACHOL) 80 MG tablet Take 1 tablet (80 mg total) by mouth every evening. 30 tablet 2  . SYMBICORT 160-4.5 MCG/ACT inhaler Inhale 2 puffs into the lungs 2 (two) times daily.     No current facility-administered medications for this visit.    Family History  Problem Relation Age of Onset  . Hypertension Mother   . Thyroid disease Mother   . Stroke Father     Review of Systems  Constitutional: Negative.   HENT: Negative.   Eyes: Negative.   Respiratory: Negative.   Cardiovascular: Negative.   Gastrointestinal: Negative.   Endocrine: Negative.  Genitourinary: Negative.   Musculoskeletal: Negative.   Skin: Negative.   Allergic/Immunologic: Negative.   Neurological: Negative.   Hematological: Negative.   Psychiatric/Behavioral: Negative.     Exam:   Ht 5' 5.25" (1.657 m)   Wt 146 lb (66.2 kg)   LMP 01/13/1993   BMI 24.11 kg/m   Height: 5' 5.25" (165.7 cm)  General appearance: alert, cooperative and appears stated age Breasts: normal appearance, no masses or tenderness Abdomen: soft, non-tender; bowel sounds normal; no masses,  no organomegaly Lymph nodes: Cervical, supraclavicular, and axillary nodes normal.  No abnormal inguinal nodes palpated Neurologic: Grossly normal  Pelvic: External genitalia:  no lesions              Urethra:  normal appearing  urethra with no masses, tenderness or lesions              Bartholins and Skenes: normal                 Vagina: atrophic vaginal tissue, no lesions, apical narrowing present that has been present that is unchanged              Cervix: no lesions              Pap taken: No. Bimanual Exam:  Uterus:  normal size, contour, position, consistency, mobility, non-tender              Adnexa: normal adnexa and no mass, fullness, tenderness               Rectovaginal: Confirms               Anus:  normal sphincter tone, no lesions  Chaperone, Olene Floss, CMA, was present for exam.  A:  Breast and Pelvic exam PMP, no HRT Polymyalgia followed by Dr. Amil Amen Elevated lipids and coronary vessel calcifications Vertigo Vaginal apex narrowing Osteoporosis with hip fracture 4/19, on Prolia Concerns about having Covid booster  P:   Mammogram guidelines reviewed.  She is still doing these. pap smear neg 2019.  Does not need this any longer.   Colonoscopy 7/16 Vaccines up to date except Covid booster and shingles vaccines.  Discussed options today. BMD scheduled with Dr. Amil Amen in December. Pt and I discussed stopping gyn exams and being seen for problems if needed.  She is going to consider.  May want to follow up in 2 years.   28 minutes of total time was spent for this patient encounter, including preparation, face-to-face counseling with the patient and coordination of care, and documentation of the encounter.

## 2019-11-11 ENCOUNTER — Encounter: Payer: Self-pay | Admitting: Obstetrics & Gynecology

## 2019-11-11 ENCOUNTER — Other Ambulatory Visit: Payer: Self-pay

## 2019-11-11 ENCOUNTER — Ambulatory Visit (INDEPENDENT_AMBULATORY_CARE_PROVIDER_SITE_OTHER): Payer: Medicare Other | Admitting: Obstetrics & Gynecology

## 2019-11-11 VITALS — BP 114/62 | HR 68 | Resp 16 | Ht 65.25 in | Wt 146.0 lb

## 2019-11-11 DIAGNOSIS — Z7185 Encounter for immunization safety counseling: Secondary | ICD-10-CM

## 2019-11-11 DIAGNOSIS — I2584 Coronary atherosclerosis due to calcified coronary lesion: Secondary | ICD-10-CM

## 2019-11-11 DIAGNOSIS — I2510 Atherosclerotic heart disease of native coronary artery without angina pectoris: Secondary | ICD-10-CM

## 2019-11-11 DIAGNOSIS — Z01419 Encounter for gynecological examination (general) (routine) without abnormal findings: Secondary | ICD-10-CM | POA: Diagnosis not present

## 2019-12-15 ENCOUNTER — Other Ambulatory Visit: Payer: Self-pay | Admitting: Pharmacist

## 2019-12-15 DIAGNOSIS — I10 Essential (primary) hypertension: Secondary | ICD-10-CM

## 2019-12-15 DIAGNOSIS — M353 Polymyalgia rheumatica: Secondary | ICD-10-CM | POA: Diagnosis not present

## 2019-12-15 DIAGNOSIS — I2510 Atherosclerotic heart disease of native coronary artery without angina pectoris: Secondary | ICD-10-CM

## 2019-12-15 DIAGNOSIS — Z6823 Body mass index (BMI) 23.0-23.9, adult: Secondary | ICD-10-CM | POA: Diagnosis not present

## 2019-12-15 DIAGNOSIS — M8000XD Age-related osteoporosis with current pathological fracture, unspecified site, subsequent encounter for fracture with routine healing: Secondary | ICD-10-CM | POA: Diagnosis not present

## 2019-12-15 NOTE — Progress Notes (Signed)
Pt called stating that she has been tolerating her new start pravastatin well without any noted ADRs. Hx of previous statin intolerance. Willing to continue with pravastatin moving forward. Will request pt to get repeat lipid panel before next OV.   Pt also concerned of her upcoming COVID booster dose. Per pt, previously has BP spike associated with Pfizer vaccine dose. Symptoms lasted for 3-5 days and improved afterwards. Pt anxious of repeat BP spikes and requesting medication therapy to take to help with possible repeat BP spike. Pt concerned with possible stroke and other cardiac complication with acute BP spike. BP readings currently well controlled at home and stable. Currently not on any antihypertensive therapy. Per Dr. Virgina Jock, okay for pt to try PRN hydralazine if needed for short term use.

## 2019-12-20 MED ORDER — HYDRALAZINE HCL 25 MG PO TABS: 25.0000 mg | ORAL_TABLET | Freq: Three times a day (TID) | ORAL | 0 refills | Status: DC | PRN

## 2019-12-30 DIAGNOSIS — I2584 Coronary atherosclerosis due to calcified coronary lesion: Secondary | ICD-10-CM | POA: Diagnosis not present

## 2019-12-30 DIAGNOSIS — I2510 Atherosclerotic heart disease of native coronary artery without angina pectoris: Secondary | ICD-10-CM | POA: Diagnosis not present

## 2019-12-31 LAB — LIPID PANEL
Chol/HDL Ratio: 2.3 ratio (ref 0.0–4.4)
Cholesterol, Total: 184 mg/dL (ref 100–199)
HDL: 80 mg/dL (ref >39)
LDL Chol Calc (NIH): 89 mg/dL (ref 0–99)
Triglycerides: 80 mg/dL (ref 0–149)
VLDL Cholesterol Cal: 15 mg/dL (ref 5–40)

## 2020-01-17 ENCOUNTER — Ambulatory Visit (INDEPENDENT_AMBULATORY_CARE_PROVIDER_SITE_OTHER): Payer: Medicare Other

## 2020-01-17 ENCOUNTER — Ambulatory Visit (INDEPENDENT_AMBULATORY_CARE_PROVIDER_SITE_OTHER): Payer: Medicare Other | Admitting: Orthopaedic Surgery

## 2020-01-17 ENCOUNTER — Encounter: Payer: Self-pay | Admitting: Orthopaedic Surgery

## 2020-01-17 VITALS — BP 162/71 | HR 26 | Ht 66.0 in | Wt 140.0 lb

## 2020-01-17 DIAGNOSIS — M48061 Spinal stenosis, lumbar region without neurogenic claudication: Secondary | ICD-10-CM | POA: Insufficient documentation

## 2020-01-17 DIAGNOSIS — M48062 Spinal stenosis, lumbar region with neurogenic claudication: Secondary | ICD-10-CM

## 2020-01-17 DIAGNOSIS — Z96642 Presence of left artificial hip joint: Secondary | ICD-10-CM

## 2020-01-17 DIAGNOSIS — M545 Low back pain, unspecified: Secondary | ICD-10-CM | POA: Diagnosis not present

## 2020-01-17 NOTE — Progress Notes (Signed)
Office Visit Note   Patient: Angela [name]           Date of Birth: July 04, 1940           MRN: [id number] Visit Date: 01/17/2020              Requested by: Sigmund Hazel, MD [address] [employer] [place],  [place] [address] PCP: Sigmund Hazel, MD   Assessment & Plan: Visit Diagnoses:  1. Acute bilateral low back pain, unspecified whether sciatica present   2. History of total hip arthroplasty, left   3. Spinal stenosis of lumbar region with neurogenic claudication     Plan: Patient has claudication symptoms at 50 yards.  Pain after standing for several minutes.  Positive grocery cart sign relief with sitting.  Anterolisthesis on plain radiographs.  Again I discussed with her my recommendation for skipping osteoporosis treatment for 6 months to allow the bone time to remodel to increase the strength.  After 6 months she could restart.  Will obtain an MRI scan lumbar spine for her likely lumbar stenosis with neurogenic claudication symptoms.  Pathophysiology discussed she does have palpable pedal pulses.  Follow-Up Instructions: No follow-ups on file.   Orders:  Orders Placed This Encounter  Procedures  . XR Lumbar Spine 2-3 Views  . MR Lumbar Spine w/o contrast   No orders of the defined types were placed in this encounter.     Procedures: No procedures performed   Clinical Data: No additional findings.   Subjective: Chief Complaint  Patient presents with  . Lower Back - Pain  . Left Hip - Pain    05/06/17 Left THA    HPI 80 year old female returns.  She states she can only walk 50 feet she gets relief when she sits down.  She notes that she can walk further in the store if she leans over the grocery cart.  After sitting for several minutes she is able get up and again ambulate 50 feet.  Some of the pain is in her hip region radiates into her thigh and she is concerned it might be related to her previous total hip arthroplasty done for acute hip fracture in April 2019.  Of  note is patient's previous cervical fusion 1993.  She has some coronary artery disease hyperlipidemia, calcification of the abdominal aorta without aneurysm formation.  Previously on Fosamax for osteoporosis now on Prolia.  She brought a bone density test which shows improvement in bone density from -4.5 to -3.7 and improvement from -1.7 to -0.8 at the radius. . Review of Systems all other systems noncontributory to HPI.   Objective: Vital Signs: BP (!) 162/71   Pulse (!) 26   Ht 5\' 6"  (1.676 m)   Wt 140 lb (63.5 kg)   LMP 01/13/1993   BMI 22.60 kg/m   Physical Exam Constitutional:      Appearance: She is well-developed.  HENT:     Head: Normocephalic.     Right Ear: External ear normal.     Left Ear: External ear normal.  Eyes:     Pupils: Pupils are equal, round, and reactive to light.  Neck:     Thyroid: No thyromegaly.     Trachea: No tracheal deviation.  Cardiovascular:     Rate and Rhythm: Normal rate.  Pulmonary:     Effort: Pulmonary effort is normal.  Abdominal:     Palpations: Abdomen is soft.  Skin:    General: Skin is warm and dry.  Neurological:     Mental Status: She is alert and oriented to person, place, and time.  Psychiatric:        Mood and Affect: Mood and affect normal.        Behavior: Behavior normal.     Ortho Exam healed anterior left hip incision minimal tenderness over the trochanter negative logroll right left hip.  No sciatic notch tenderness.  Anterior tib EHL is strong.  Pedal pulses are palpable.  Specialty Comments:  No specialty comments available.  Imaging: XR Lumbar Spine 2-3 Views  Result Date: 01/17/2020 AP lumbar spine and lateral flexion-extension images obtained and reviewed.  This shows minimal excursion on flexion-extension but there is grade 1 anterolisthesis at L4-5 and L5-S1 with apparent intact pars.  No oblique films were obtained. Impression: Lumbar facet degeneration with a few millimeters anterolisthesis L4-5 L5-S1  which does not increase on flexion-extension images although there was limited positioning changed by the patient    PMFS History: Patient Active Problem List   Diagnosis Date Noted  . Spinal stenosis of lumbar region 01/17/2020  . Mixed hyperlipidemia 09/15/2019  . Statin intolerance 09/15/2019  . Coronary artery disease involving native coronary artery of native heart without angina pectoris 10/25/2018  . Elevated blood pressure reading without diagnosis of hypertension 10/25/2018  . Sudden left hearing loss 07/30/2018  . 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 History of total hip arthroplasty, left 07/10/2017  . Closed left hip fracture, initial encounter (HCC) 05/05/2017  . Polymyalgia (HCC) 07/08/2013   Past Medical History:  Diagnosis Date  . Carpal tunnel syndrome on right 10/08  . Elevated cholesterol 5/93  . Nephrolithiasis 2010  . Osteoporosis   . Polymyalgia (HCC) 2/97    Family History  Problem Relation Age of Onset  . Hypertension Mother   . Thyroid disease Mother   . Stroke Father     Past Surgical History:  Procedure Laterality Date  . BLEPHAROPLASTY Bilateral 2/14   Dr. Shawna Orleans  . CARPAL TUNNEL RELEASE Right 6/08  . CERVICAL DISCECTOMY  12/91  . KNEE SURGERY Left   . spots removed     frozen and biopsied-all benign  . TONSILLECTOMY AND ADENOIDECTOMY    . TOTAL HIP ARTHROPLASTY Left 05/06/2017   Procedure: TOTAL ANTERIOR HIP ARTHROPLASTY -LEFT;  Surgeon: Eldred Manges, MD;  Location: [facility] OR;  Service: Orthopedics;  Laterality: Left;   Social History   Occupational History  . Not on file  Tobacco Use  . Smoking status: Never Smoker  . Smokeless tobacco: Never Used  Vaping Use  . Vaping Use: Never used  Substance and Sexual Activity  . Alcohol use: Not Currently  . Drug use: No  . Sexual activity: Not Currently    Partners: Male    Birth control/protection: Other-see comments    Comment: spouse with vasectomy

## 2020-01-18 ENCOUNTER — Encounter: Payer: Self-pay | Admitting: Cardiology

## 2020-01-18 ENCOUNTER — Other Ambulatory Visit: Payer: Self-pay

## 2020-01-18 ENCOUNTER — Ambulatory Visit: Payer: Medicare Other | Admitting: Cardiology

## 2020-01-18 VITALS — BP 157/49 | HR 63 | Ht 66.0 in | Wt 145.0 lb

## 2020-01-18 DIAGNOSIS — E782 Mixed hyperlipidemia: Secondary | ICD-10-CM

## 2020-01-18 DIAGNOSIS — I2510 Atherosclerotic heart disease of native coronary artery without angina pectoris: Secondary | ICD-10-CM

## 2020-01-18 DIAGNOSIS — Z789 Other specified health status: Secondary | ICD-10-CM

## 2020-01-18 DIAGNOSIS — I10 Essential (primary) hypertension: Secondary | ICD-10-CM | POA: Insufficient documentation

## 2020-01-18 DIAGNOSIS — I2584 Coronary atherosclerosis due to calcified coronary lesion: Secondary | ICD-10-CM

## 2020-01-18 MED ORDER — PRAVASTATIN SODIUM 80 MG PO TABS: 80.0000 mg | ORAL_TABLET | Freq: Every evening | ORAL | 2 refills | Status: DC

## 2020-01-18 NOTE — Progress Notes (Signed)
Follow up visit  Subjective:   Angela [name], female    DOB: 12-Jan-1941, 80 y.o.   MRN: [id number]   HPI   Chief Complaint  Patient presents with  . Coronary Artery Disease    Foll    . Follow-up  . Results    80 y.o. Caucasian female with hypertension, hyperlipidemia, coronary atherosclerosis, [name]  Patient is doing well. Blood pressure always normal at home, and always elevated at office visit. Reviewed recent lipid panel results with the patient, details below.    Current Outpatient Medications on File Prior to Visit  Medication Sig Dispense Refill  . Cholecalciferol (VITAMIN D-3 PO) Take 1 capsule by mouth daily.    Marland Kitchen denosumab (PROLIA) 60 MG/ML SOSY injection Inject 60 mg into the skin every 6 (six) months.    . hydrALAZINE (APRESOLINE) 25 MG tablet Take 1 tablet (25 mg total) by mouth 3 (three) times daily as needed (if SBP(top reading) >150 mmHg). 30 tablet 0  . Multiple Vitamins-Minerals (MULTIVITAMIN PO) Take 1 tablet by mouth daily.     . Omega-3 Fatty Acids (FISH OIL PO) Take 1 capsule by mouth daily.     . pravastatin (PRAVACHOL) 80 MG tablet Take 1 tablet (80 mg total) by mouth every evening. 30 tablet 2  . SYMBICORT 160-4.5 MCG/ACT inhaler Inhale 2 puffs into the lungs 2 (two) times daily.     No current facility-administered medications on file prior to visit.    Cardiovascular & other pertient studies:  EKG 05/30/2019: Sinus rhythm 63 bpm.  Nonspecific ST-T changes anteroseptal leads.  Echocardiogram 05/02/2019:  Normal LV systolic function with visual EF 60-65%. Left ventricle cavity is normal in size. Normal global wall motion.  Normal diastolic filling pattern, normal LAP. Calculated EF 61%.  Mild (Grade I) aortic regurgitation.  Mild (Grade I) mitral regurgitation.  No prior study for comparison.  Lexiscan myoview stress test 10/12/2017: 1. The resting electrocardiogram demonstrated normal sinus rhythm, normal resting conduction, no  resting arrhythmias and normal rest repolarization. Stress EKG is nondiagnostic for ischemia as a pharmacologic stress test. 2. Myocardial perfusion imaging is normal. Overall left ventricular systolic function was normal without regional wall motion abnormalities. The left ventricular ejection fraction was 82%. 3. Low risk study.   CT Chest 09/25/2017: 1. Small nonspecific pulmonary nodules within the RIGHT lung, both measuring 4 mm with 1 likely corresponding to the recent chest x-ray finding, as well as chronic appearing nodular scarring/atelectasis along the pleura of the RIGHT upper lobe.  Recommend follow-up noncontrast chest CT in 12 months to ensure stability of all findings. 2. Coronary artery calcifications, particularly dense within the LEFT main and LEFT anterior descending coronary arteries.  Recommend correlation with any possible associated cardiac symptoms.  Recent labs: 12/30/2019: Chol 184, TG 80, HDL 80, LDL 89  08/31/2019: Glucose 81, BUN/Cr 15/0.7. EGFR 85 Chol 265, TG 140, HDL 75, LDL 165   Lipid panel 11/01/2018: Cholesterol 169, triglycerides 83, HDL 79, LDL 75.  09/30/2017: Glucose 85. BUN/creatinine 12/0.67.  EGFR 86/104.  Sodium 140, potassium 4.7.  Rest of the CMP normal. Cholesterol 230, triglycerides 52, HDL 73, LDL 146    Review of Systems  Cardiovascular: Negative for chest pain, dyspnea on exertion, leg swelling, palpitations and syncope.         Vitals:   01/18/20 0914  BP: (!) 157/49  Pulse: 63  SpO2: 98%     Body mass index is 23.4 kg/m. Filed Weights   01/18/20 0914  Weight: 145 lb (65.8 kg)     Objective:   Physical Exam Vitals and nursing note reviewed.  Constitutional:      General: She is not in acute distress. Neck:     Vascular: No JVD.  Cardiovascular:     Rate and Rhythm: Normal rate and regular rhythm.     Pulses: Intact distal pulses.     Heart sounds: Murmur heard.   Early diastolic murmur is present with a  grade of 1/4 at the upper right sternal border.   Pulmonary:     Effort: Pulmonary effort is normal.     Breath sounds: Normal breath sounds. No wheezing or rales.         Assessment & Recommendations:   80 y.o. Caucasian female with hypertension, hyperlipidemia, coronary atherosclerosis, [name]  Coronary artery calcification: No angina symptoms. Nuclear stress test with no iscehmia, infarction (09/2017). Continue risk factor modification. Continue aspirin 81 mg daily. LDL 165-->89 on pravastatin 20 mg. HDL 80. Intolerant to other statins. PCSK9 inhibitor not approved by insurance.  Continue pravastatin 80 mg.  White coat hypertension: Continue home monitoring for now.  Mild AI, MR: Clinically asymptomatic  F/u in 6 months after repeat lipid panel If LDL stable, will f/u once a year.    Angela Negus, MD [facility] [place] Pager: [phone] Office: [phone]

## 2020-01-27 DIAGNOSIS — G5602 Carpal tunnel syndrome, left upper limb: Secondary | ICD-10-CM | POA: Diagnosis not present

## 2020-02-10 DIAGNOSIS — R202 Paresthesia of skin: Secondary | ICD-10-CM | POA: Diagnosis not present

## 2020-02-10 DIAGNOSIS — R200 Anesthesia of skin: Secondary | ICD-10-CM | POA: Diagnosis not present

## 2020-02-24 ENCOUNTER — Ambulatory Visit
Admission: RE | Admit: 2020-02-24 | Discharge: 2020-02-24 | Disposition: A | Discharge status: Home / Self Care | Payer: Medicare Other | Source: Ambulatory Visit | Attending: Orthopaedic Surgery | Admitting: Orthopaedic Surgery

## 2020-02-24 ENCOUNTER — Other Ambulatory Visit: Payer: Self-pay

## 2020-02-24 DIAGNOSIS — M48061 Spinal stenosis, lumbar region without neurogenic claudication: Secondary | ICD-10-CM | POA: Diagnosis not present

## 2020-02-24 DIAGNOSIS — M545 Low back pain, unspecified: Secondary | ICD-10-CM

## 2020-02-24 IMAGING — MR MR LUMBAR SPINE W/O CM
4 of 5 series · 27 of 48 positions shown · non-contrast
Comparison: Lumbar radiographs [DATE]

CLINICAL DATA: Low back pain with claudication. Left-sided pain and
weakness.

EXAM:
MRI LUMBAR SPINE WITHOUT CONTRAST
TECHNIQUE: Multiplanar, multisequence MR imaging of the lumbar spine was
performed. No intravenous contrast was administered.

[Series 3: T2 · sagittal · 4.0mm · 1.09mm/px · 6 of 17 slices shown (1 of 2)]
[im 1/17]
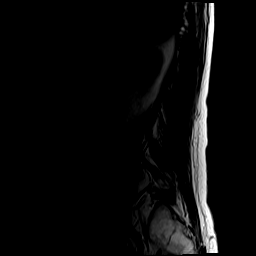
[im 4/17]
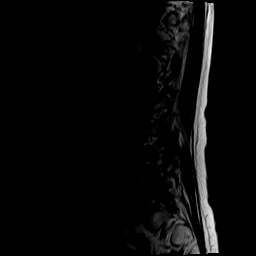
[im 7/17]
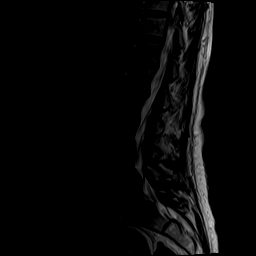
[im 10/17]
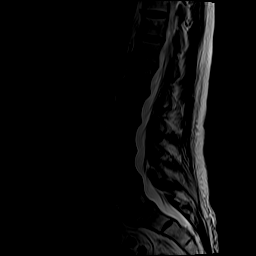
[im 13/17]
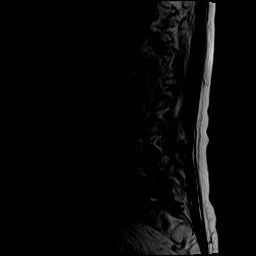
[im 17/17]
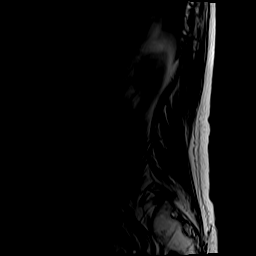

[Series 5: T1 · sagittal · 4.0mm · 1.09mm/px · 6 of 17 slices shown (1 of 2)]
[im 1/17]
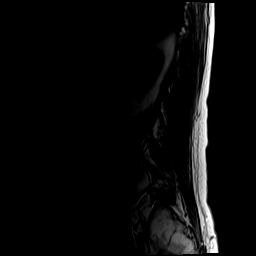
[im 4/17]
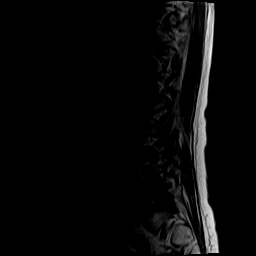
[im 7/17]
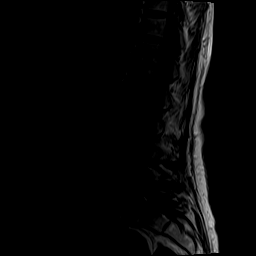
[im 10/17]
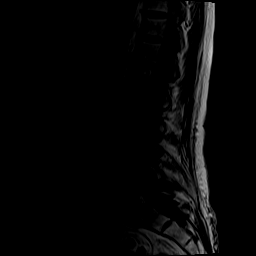
[im 13/17]
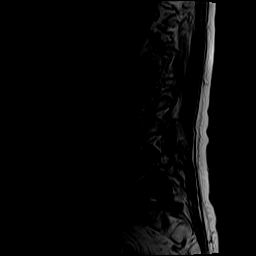
[im 17/17]
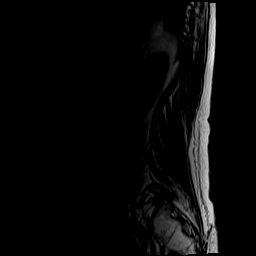

[Series 6: T2 · axial · 4.0mm · 0.39mm/px · z∈[-125,+89]mm · 9 of 42 slices shown (2 of 2)]
[im 1/42]
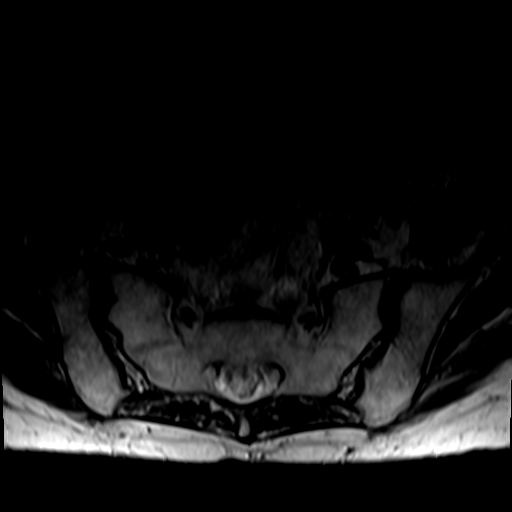
[im 6/42]
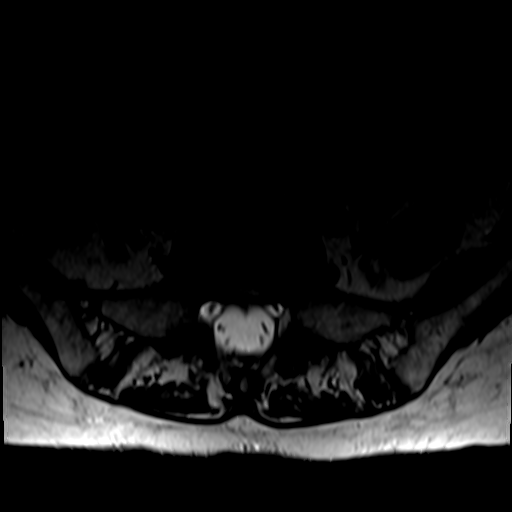
[im 12/42]
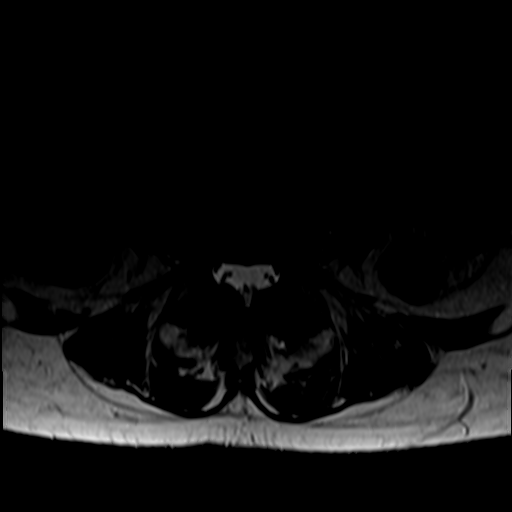
[im 18/42]
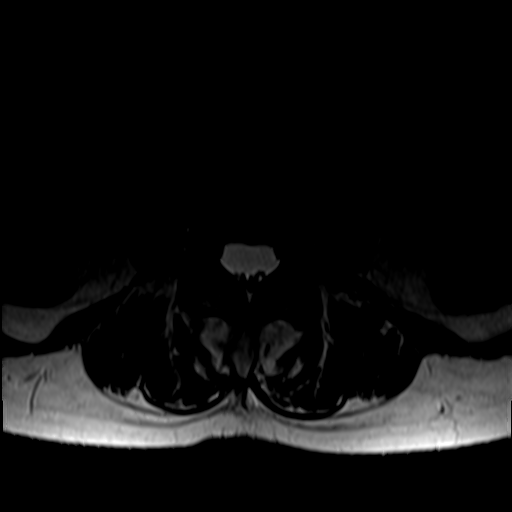
[im 21/42]
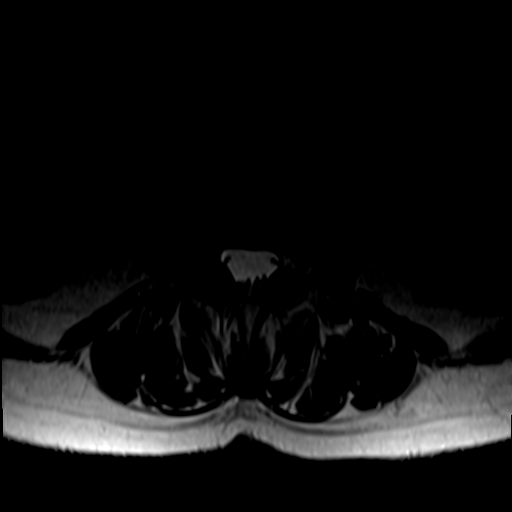
[im 24/42]
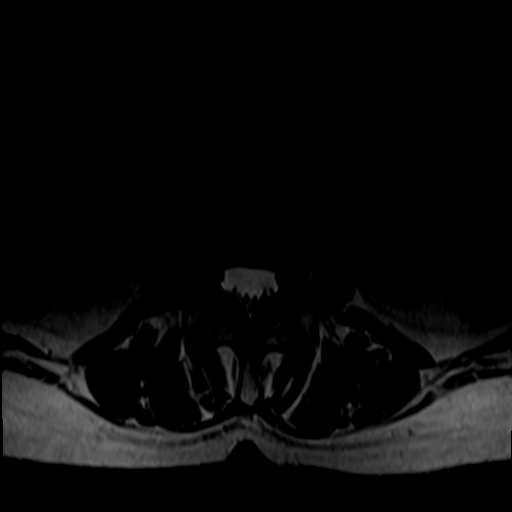
[im 30/42]
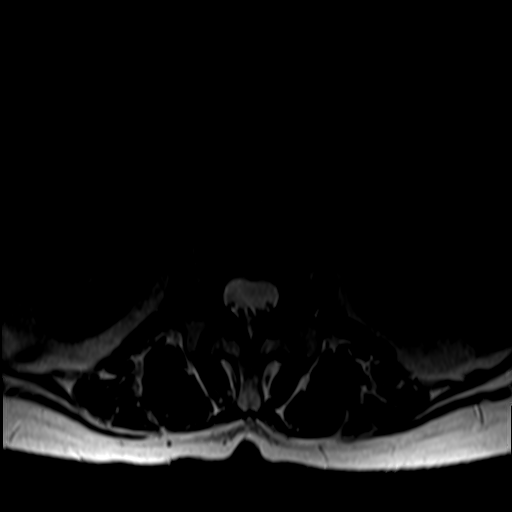
[im 36/42]
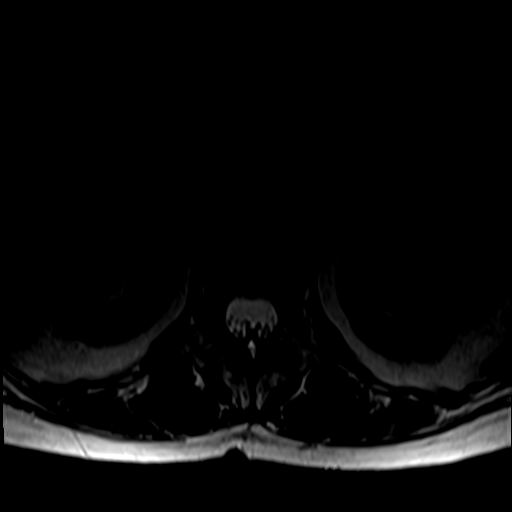
[im 42/42]
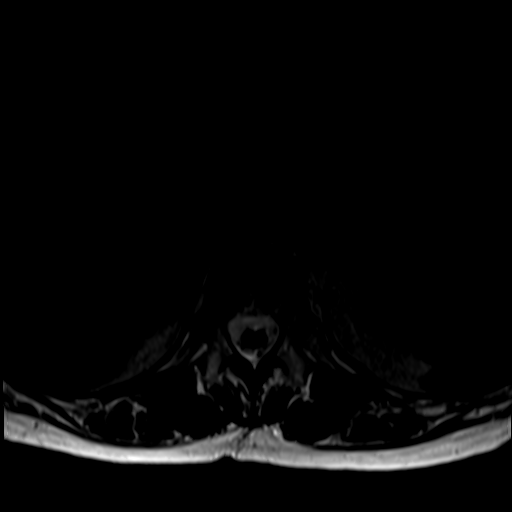

[Series 7: T1 · axial · 4.0mm · 0.39mm/px · z∈[-125,+61]mm · 6 of 42 slices shown (2 of 2)]
[im 1/42]
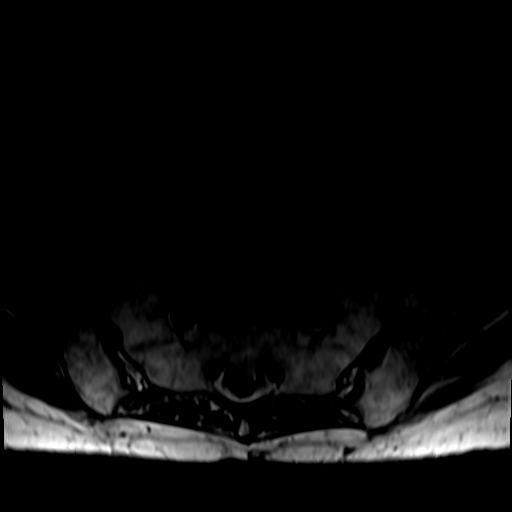
[im 6/42]
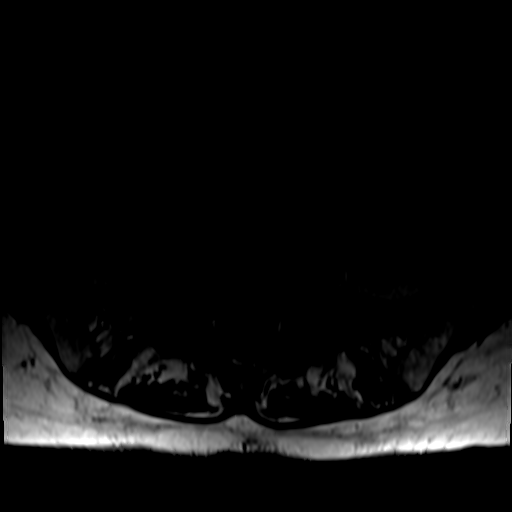
[im 12/42]
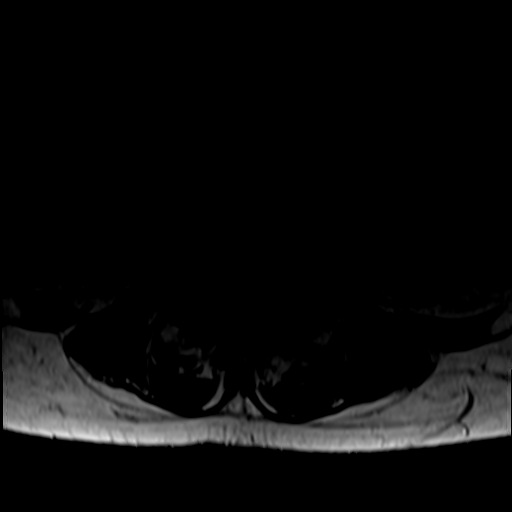
[im 18/42]
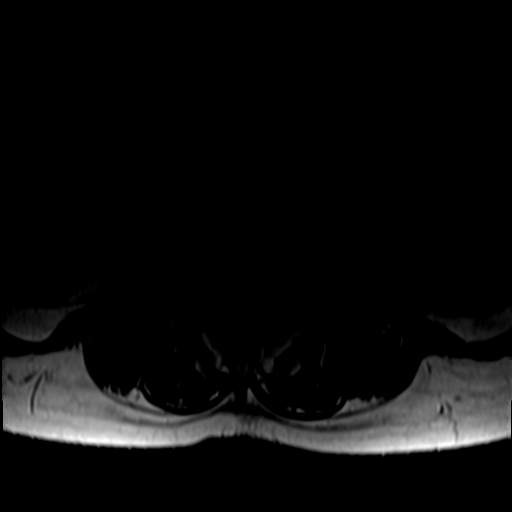
[im 21/42]
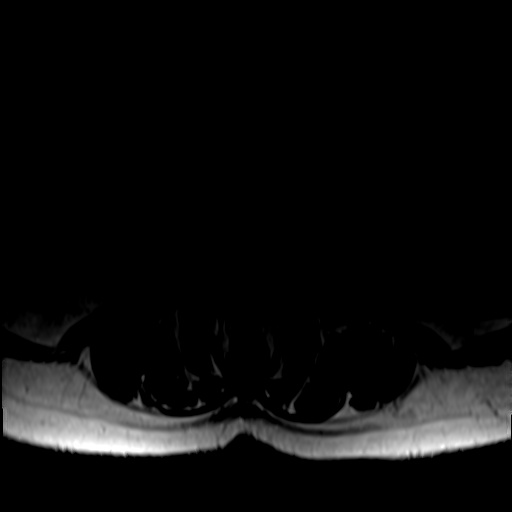
[im 36/42]
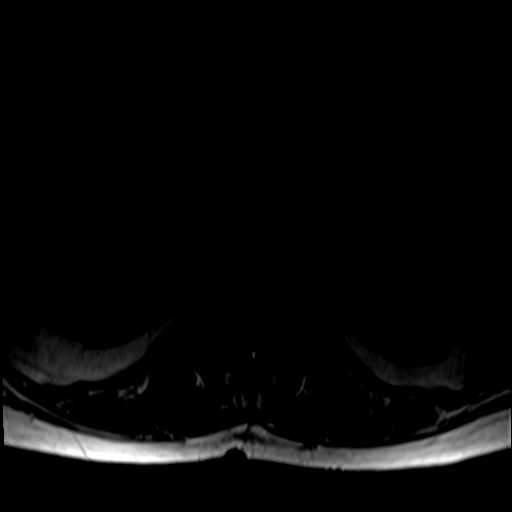

[27 of 48 positions shown; findings below may reference images not displayed]

FINDINGS: Segmentation:  Normal

Alignment:  4 mm anterolisthesis L4-5 and L5-S1.

Vertebrae:  Normal bone marrow.  Negative for fracture or mass.

Conus medullaris and cauda equina: Conus extends to the L1-2 level.
Conus and cauda equina appear normal.

Paraspinal and other soft tissues: Negative for paraspinous mass or
adenopathy.

Disc levels:

L1-2: Mild disc degeneration with disc bulging and Schmorl's node.
Early facet degeneration. Negative for stenosis

L2-3: Mild disc and mild facet degeneration. Minimal left-sided disc
protrusion. Negative for stenosis.

L3-4: Mild disc bulging and mild facet degeneration. Negative for
stenosis.

L4-5: 4 mm anterolisthesis with moderate to advanced facet
degeneration bilaterally. Diffuse disc bulging with small central
disc protrusion. Mild spinal stenosis. Mild subarticular stenosis on
the right.

L5-S1: 4 mm anterolisthesis with advanced facet degeneration.
Synovial cyst projecting into the neural foramina bilaterally.
Severe right foraminal encroachment with compression of the right L5
nerve root impingement. Moderate left foraminal stenosis with
impingement of the left L5 nerve root.
IMPRESSION: 4 mm anterolisthesis L4-5 with advanced facet degeneration. Mild
spinal stenosis and mild subarticular stenosis on the right

4 mm anterolisthesis L5-S1. Severe facet degeneration with bilateral
synovial cyst projecting into the neural foramina. There is
bilateral L5 nerve root impingement right greater than left.

## 2020-03-02 ENCOUNTER — Telehealth: Payer: Self-pay | Admitting: Pharmacist

## 2020-03-02 NOTE — Telephone Encounter (Signed)
Agree 

## 2020-03-02 NOTE — Telephone Encounter (Signed)
Pt called with concerns of uncontrolled BP readings over the past two days. Reports BP this morning of 177/63. SBP ranging from 133-188 over the last two days. Pt notes to have mild dull headache during the day but denies any other complains of CP, focal deficit changes, SOB. Pt reports that she had really spicy and salty food intake over the past two days that she attributes to her recent BP changes. Denies any other relevant medications or lifestyle changes. Pt reports having taken 2 doses of hydralazine today with mild improvement. Recommended pt to schedule hydralazine 25 mg TID and to take extra hydralazine if SBP>150 mmHg. Pt to continue monitoring and review BP over the weekend. If BP continues to remain elevated, pt open to having a scheduled antihypertensive moving forward. Pt aware to call EMS if she has any severe symptoms.

## 2020-03-05 NOTE — Telephone Encounter (Addendum)
Called to follow up with pt. Pt reports that she is tolerating new start amlodipine well without any noted ADRs. Denies any headaches or lower extremity swelling. BP readings improved since the addition of amlodipine. Pt willing to continue current therapy and continue home monitoring of BP readings. Pt to notify the office if SBP continues to remain >140.

## 2020-03-08 ENCOUNTER — Other Ambulatory Visit: Payer: Self-pay | Admitting: Pharmacist

## 2020-03-08 MED ORDER — AMLODIPINE BESYLATE 5 MG PO TABS: 5.0000 mg | ORAL_TABLET | Freq: Every day | ORAL | 1 refills | Status: DC

## 2020-03-08 NOTE — Progress Notes (Signed)
Pt continues to report having few episodes elevated BP readings since taking hydralazine 50 mg TID. Pt open to starting amlodipine 5 mg daily for better BP control. Will pend Rx for Dr. Virgina Jock.

## 2020-03-08 NOTE — Progress Notes (Signed)
Agree. Please keep me posted.  Thanks MJP

## 2020-03-20 ENCOUNTER — Ambulatory Visit (INDEPENDENT_AMBULATORY_CARE_PROVIDER_SITE_OTHER): Payer: Medicare Other | Admitting: Orthopaedic Surgery

## 2020-03-20 ENCOUNTER — Encounter: Payer: Self-pay | Admitting: Orthopaedic Surgery

## 2020-03-20 ENCOUNTER — Other Ambulatory Visit: Payer: Self-pay

## 2020-03-20 VITALS — BP 158/66 | HR 66 | Ht 66.0 in | Wt 140.0 lb

## 2020-03-20 DIAGNOSIS — M48062 Spinal stenosis, lumbar region with neurogenic claudication: Secondary | ICD-10-CM

## 2020-03-20 DIAGNOSIS — I2510 Atherosclerotic heart disease of native coronary artery without angina pectoris: Secondary | ICD-10-CM | POA: Diagnosis not present

## 2020-03-20 DIAGNOSIS — I2584 Coronary atherosclerosis due to calcified coronary lesion: Secondary | ICD-10-CM

## 2020-03-20 NOTE — Progress Notes (Signed)
Office Visit Note   Patient: Angela [name]           Date of Birth: December 28, 1940           MRN: [id number] Visit Date: 03/20/2020              Requested by: Sigmund Hazel, MD [address] [place],  [place] [address] PCP: Sigmund Hazel, MD   Assessment & Plan: Visit Diagnoses:  1. Spinal stenosis of lumbar region with neurogenic claudication     Plan: We reviewed the patient's scan and gave her copy report.  She has anterolisthesis at L4-5 with mild stenosis and mild subarticular stenosis on the right.  4 mm anterolisthesis at L5-S1 with bilateral synovial cyst projecting into the neural foramina.  There is some bilateral L5 nerve root impingement slightly worse on the right than left.  Patient states she gets relief when she sits down very rapidly.  We discussed surgery options.  Currently she does not think that her symptoms are severe enough to consider proceeding with surgical intervention.  She can follow-up if she gets worse symptoms which we discussed in detail.  Follow-Up Instructions: No follow-ups on file.   Orders:  No orders of the defined types were placed in this encounter.  No orders of the defined types were placed in this encounter.     Procedures: No procedures performed   Clinical Data: No additional findings.   Subjective: Chief Complaint  Patient presents with  . Lower Back - Pain, Follow-up    MRI Review    HPI 80 year old female returns post MRI lumbar available for review.  She has osteoporosis and previous total hip arthroplasty on the left.  Patient has been on Prolia and skipped the last dose and can restart it again in 6 months.  Previous bone density scan showed osteoporosis.  Z score varied between  minus  0.8 to -1.7.  Patient is on calcium and vitamin D.  Patient's old bone density several years ago was -4.5 to -3.7 so she has had significant improvement with originally Fosamax and then later Prolia.  Patient has been having problems  standing and walking long distance.  Patient to walk 50 feet she gets relief with sitting she does better leaning on a grocery cart relief with sitting or lying down.  Total hip arthroplasty was done for acute hip fracture April 2019.  Previous cervical fusion 1993.  Review of Systems all other systems noncontributory.   Objective: Vital Signs: BP (!) 158/66 (BP Location: Left Arm, Patient Position: Sitting)   Pulse 66   Ht 5\' 6"  (1.676 m)   Wt 140 lb (63.5 kg)   LMP 01/13/1993   BMI 22.60 kg/m   Physical Exam Constitutional:      Appearance: She is well-developed.  HENT:     Head: Normocephalic.     Right Ear: External ear normal.     Left Ear: External ear normal.  Eyes:     Pupils: Pupils are equal, round, and reactive to light.  Neck:     Thyroid: No thyromegaly.     Trachea: No tracheal deviation.  Cardiovascular:     Rate and Rhythm: Normal rate.  Pulmonary:     Effort: Pulmonary effort is normal.  Abdominal:     Palpations: Abdomen is soft.  Skin:    General: Skin is warm and dry.  Neurological:     Mental Status: She is alert and oriented to person, place, and time.  Psychiatric:        Mood and Affect: Mood and affect normal.        Behavior: Behavior normal.     Ortho Exam well-healed hip and neck incision.  Mild tenderness over trochanter.  No sciatic notch tenderness anterior tib gastrocsoleus is strong and symmetrical with resistance pedal pulses are palpable. Specialty Comments:  No specialty comments available.  Imaging: CLINICAL DATA:  Low back pain with claudication. Left-sided pain and weakness.  EXAM: MRI LUMBAR SPINE WITHOUT CONTRAST  TECHNIQUE: Multiplanar, multisequence MR imaging of the lumbar spine was performed. No intravenous contrast was administered.  COMPARISON:  Lumbar radiographs 01/17/2020  FINDINGS: Segmentation:  Normal  Alignment:  4 mm anterolisthesis L4-5 and L5-S1.  Vertebrae:  Normal bone marrow.  Negative  for fracture or mass.  Conus medullaris and cauda equina: Conus extends to the L1-2 level. Conus and cauda equina appear normal.  Paraspinal and other soft tissues: Negative for paraspinous mass or adenopathy.  Disc levels:  L1-2: Mild disc degeneration with disc bulging and Schmorl's node. Early facet degeneration. Negative for stenosis  L2-3: Mild disc and mild facet degeneration. Minimal left-sided disc protrusion. Negative for stenosis.  L3-4: Mild disc bulging and mild facet degeneration. Negative for stenosis.  L4-5: 4 mm anterolisthesis with moderate to advanced facet degeneration bilaterally. Diffuse disc bulging with small central disc protrusion. Mild spinal stenosis. Mild subarticular stenosis on the right.  L5-S1: 4 mm anterolisthesis with advanced facet degeneration. Synovial cyst projecting into the neural foramina bilaterally. Severe right foraminal encroachment with compression of the right L5 nerve root impingement. Moderate left foraminal stenosis with impingement of the left L5 nerve root.  IMPRESSION: 4 mm anterolisthesis L4-5 with advanced facet degeneration. Mild spinal stenosis and mild subarticular stenosis on the right  4 mm anterolisthesis L5-S1. Severe facet degeneration with bilateral synovial cyst projecting into the neural foramina. There is bilateral L5 nerve root impingement right greater than left.   Electronically Signed   By: Marlan Palau M.D.   On: 02/24/2020 09:48    PMFS History: Patient Active Problem List   Diagnosis Date Noted  . Essential hypertension 01/18/2020  . Spinal stenosis of lumbar region 01/17/2020  . Mixed hyperlipidemia 09/15/2019  . Statin intolerance 09/15/2019  . Coronary artery disease involving native coronary artery of native heart without angina pectoris 10/25/2018  . Elevated blood pressure reading without diagnosis of hypertension 10/25/2018  . Sudden left hearing loss 07/30/2018  .  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 History of total hip arthroplasty, left 07/10/2017  . Closed left hip fracture, initial encounter (HCC) 05/05/2017  . Polymyalgia (HCC) 07/08/2013   Past Medical History:  Diagnosis Date  . Carpal tunnel syndrome on right 10/08  . Elevated cholesterol 5/93  . Nephrolithiasis 2010  . Osteoporosis   . Polymyalgia (HCC) 2/97    Family History  Problem Relation Age of Onset  . Hypertension Mother   . Thyroid disease Mother   . Stroke Father     Past Surgical History:  Procedure Laterality Date  . BLEPHAROPLASTY Bilateral 2/14   Dr. Shawna Orleans  . CARPAL TUNNEL RELEASE Right 6/08  . CERVICAL DISCECTOMY  12/91  . KNEE SURGERY Left   . spots removed     frozen and biopsied-all benign  . TONSILLECTOMY AND ADENOIDECTOMY    . TOTAL HIP ARTHROPLASTY Left 05/06/2017   Procedure: TOTAL ANTERIOR HIP ARTHROPLASTY -LEFT;  Surgeon: Eldred Manges, MD;  Location: [facility] OR;  Service: Orthopedics;  Laterality: Left;   Social History   Occupational History  . Not on file  Tobacco Use  . Smoking status: Never Smoker  . Smokeless tobacco: Never Used  Vaping Use  . Vaping Use: Never used  Substance and Sexual Activity  . Alcohol use: Not Currently  . Drug use: No  . Sexual activity: Not Currently    Partners: Male    Birth control/protection: Other-see comments    Comment: spouse with vasectomy

## 2020-04-11 DIAGNOSIS — G5603 Carpal tunnel syndrome, bilateral upper limbs: Secondary | ICD-10-CM | POA: Diagnosis not present

## 2020-04-12 ENCOUNTER — Other Ambulatory Visit: Payer: Self-pay | Admitting: Orthopedic Surgery

## 2020-04-24 ENCOUNTER — Encounter (HOSPITAL_BASED_OUTPATIENT_CLINIC_OR_DEPARTMENT_OTHER): Payer: Self-pay | Admitting: Orthopedic Surgery

## 2020-04-24 ENCOUNTER — Other Ambulatory Visit: Payer: Self-pay

## 2020-04-25 NOTE — Telephone Encounter (Signed)
From patient.

## 2020-04-25 NOTE — Telephone Encounter (Signed)
Low cardiac risk for carpal tunnel surgery.  Thanks MJP

## 2020-04-30 ENCOUNTER — Emergency Department (HOSPITAL_BASED_OUTPATIENT_CLINIC_OR_DEPARTMENT_OTHER): Payer: Medicare Other | Admitting: Radiology

## 2020-04-30 ENCOUNTER — Emergency Department (HOSPITAL_BASED_OUTPATIENT_CLINIC_OR_DEPARTMENT_OTHER)
Admission: EM | Admit: 2020-04-30 | Discharge: 2020-04-30 | Disposition: A | Discharge status: Home / Self Care | Payer: Medicare Other | Attending: Emergency Medicine | Admitting: Emergency Medicine

## 2020-04-30 ENCOUNTER — Other Ambulatory Visit (HOSPITAL_COMMUNITY)
Admission: RE | Admit: 2020-04-30 | Discharge: 2020-04-30 | Disposition: A | Discharge status: Home / Self Care | Payer: Medicare Other | Source: Ambulatory Visit | Attending: Orthopedic Surgery | Admitting: Orthopedic Surgery

## 2020-04-30 ENCOUNTER — Other Ambulatory Visit: Payer: Self-pay

## 2020-04-30 ENCOUNTER — Encounter (HOSPITAL_BASED_OUTPATIENT_CLINIC_OR_DEPARTMENT_OTHER): Payer: Self-pay | Admitting: Emergency Medicine

## 2020-04-30 DIAGNOSIS — Y93D2 Activity, sewing: Secondary | ICD-10-CM | POA: Diagnosis not present

## 2020-04-30 DIAGNOSIS — S63502A Unspecified sprain of left wrist, initial encounter: Secondary | ICD-10-CM

## 2020-04-30 DIAGNOSIS — I2510 Atherosclerotic heart disease of native coronary artery without angina pectoris: Secondary | ICD-10-CM | POA: Diagnosis not present

## 2020-04-30 DIAGNOSIS — Z79899 Other long term (current) drug therapy: Secondary | ICD-10-CM | POA: Diagnosis not present

## 2020-04-30 DIAGNOSIS — Z01812 Encounter for preprocedural laboratory examination: Secondary | ICD-10-CM | POA: Insufficient documentation

## 2020-04-30 DIAGNOSIS — W010XXA Fall on same level from slipping, tripping and stumbling without subsequent striking against object, initial encounter: Secondary | ICD-10-CM | POA: Diagnosis not present

## 2020-04-30 DIAGNOSIS — Z20822 Contact with and (suspected) exposure to covid-19: Secondary | ICD-10-CM | POA: Insufficient documentation

## 2020-04-30 DIAGNOSIS — Y92009 Unspecified place in unspecified non-institutional (private) residence as the place of occurrence of the external cause: Secondary | ICD-10-CM | POA: Insufficient documentation

## 2020-04-30 DIAGNOSIS — I10 Essential (primary) hypertension: Secondary | ICD-10-CM | POA: Diagnosis not present

## 2020-04-30 DIAGNOSIS — S6992XA Unspecified injury of left wrist, hand and finger(s), initial encounter: Secondary | ICD-10-CM | POA: Diagnosis present

## 2020-04-30 DIAGNOSIS — Z96642 Presence of left artificial hip joint: Secondary | ICD-10-CM | POA: Diagnosis not present

## 2020-04-30 DIAGNOSIS — M25532 Pain in left wrist: Secondary | ICD-10-CM | POA: Diagnosis not present

## 2020-04-30 IMAGING — DX DG WRIST COMPLETE 3+V*L*
1 series · 4 of 4 positions shown · non-contrast
Comparison: None.

CLINICAL DATA: Left wrist pain after fall.

EXAM:
LEFT WRIST - COMPLETE 3+ VIEW

[Series 1: wrist · 0.14mm/px · 4 of 4 slices shown]
[im 1/4]
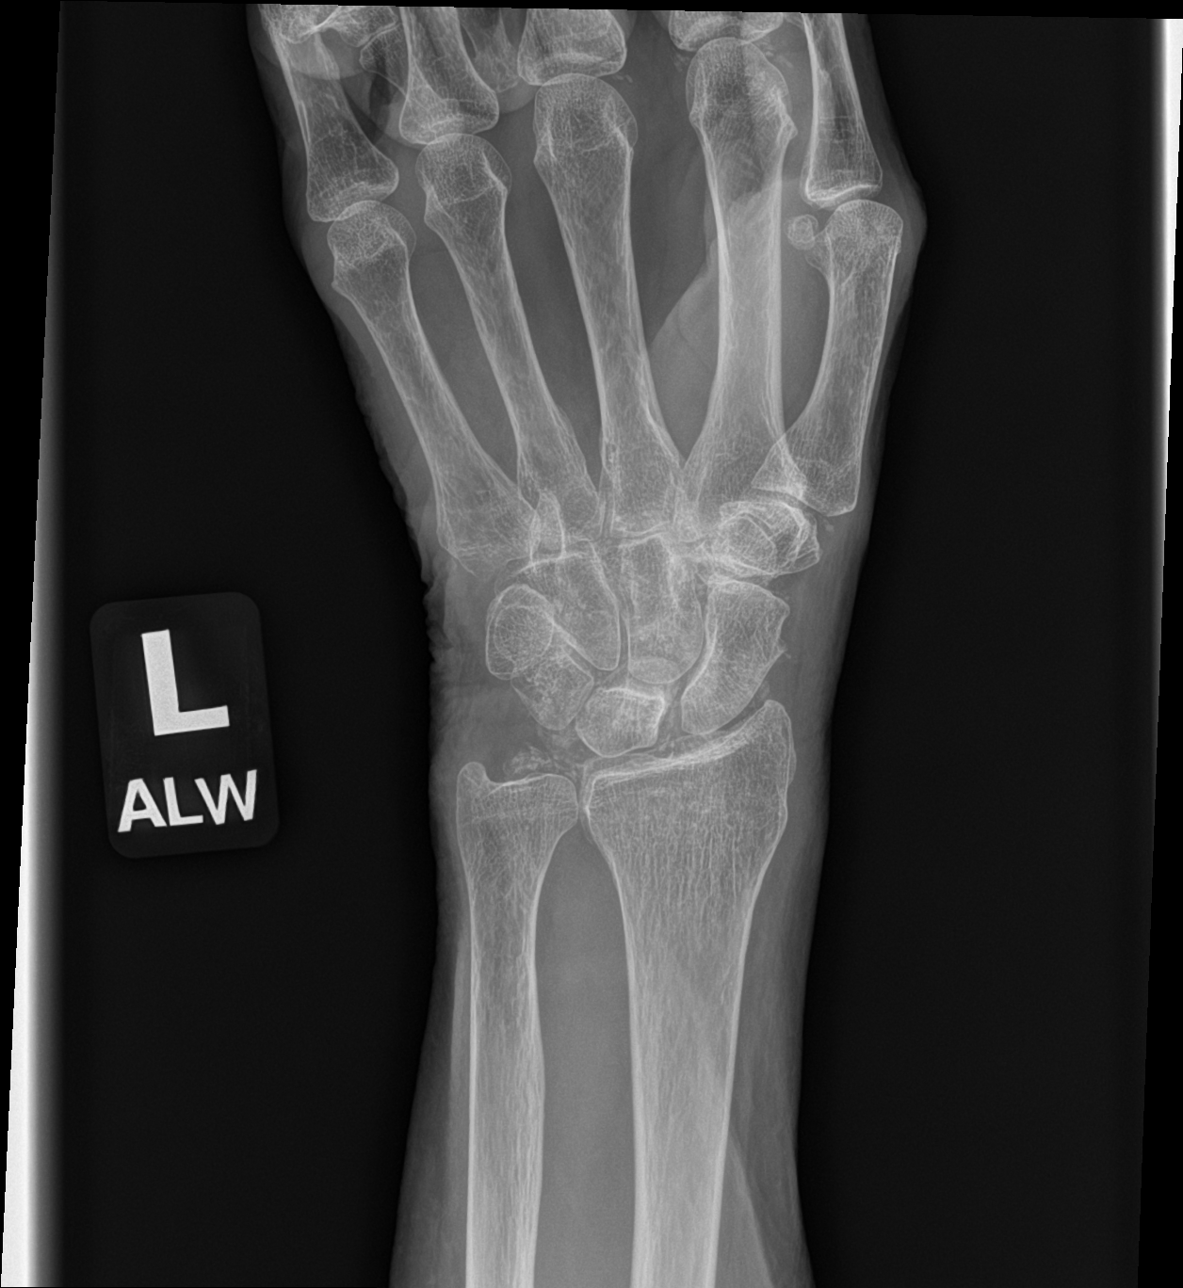
[im 2/4]
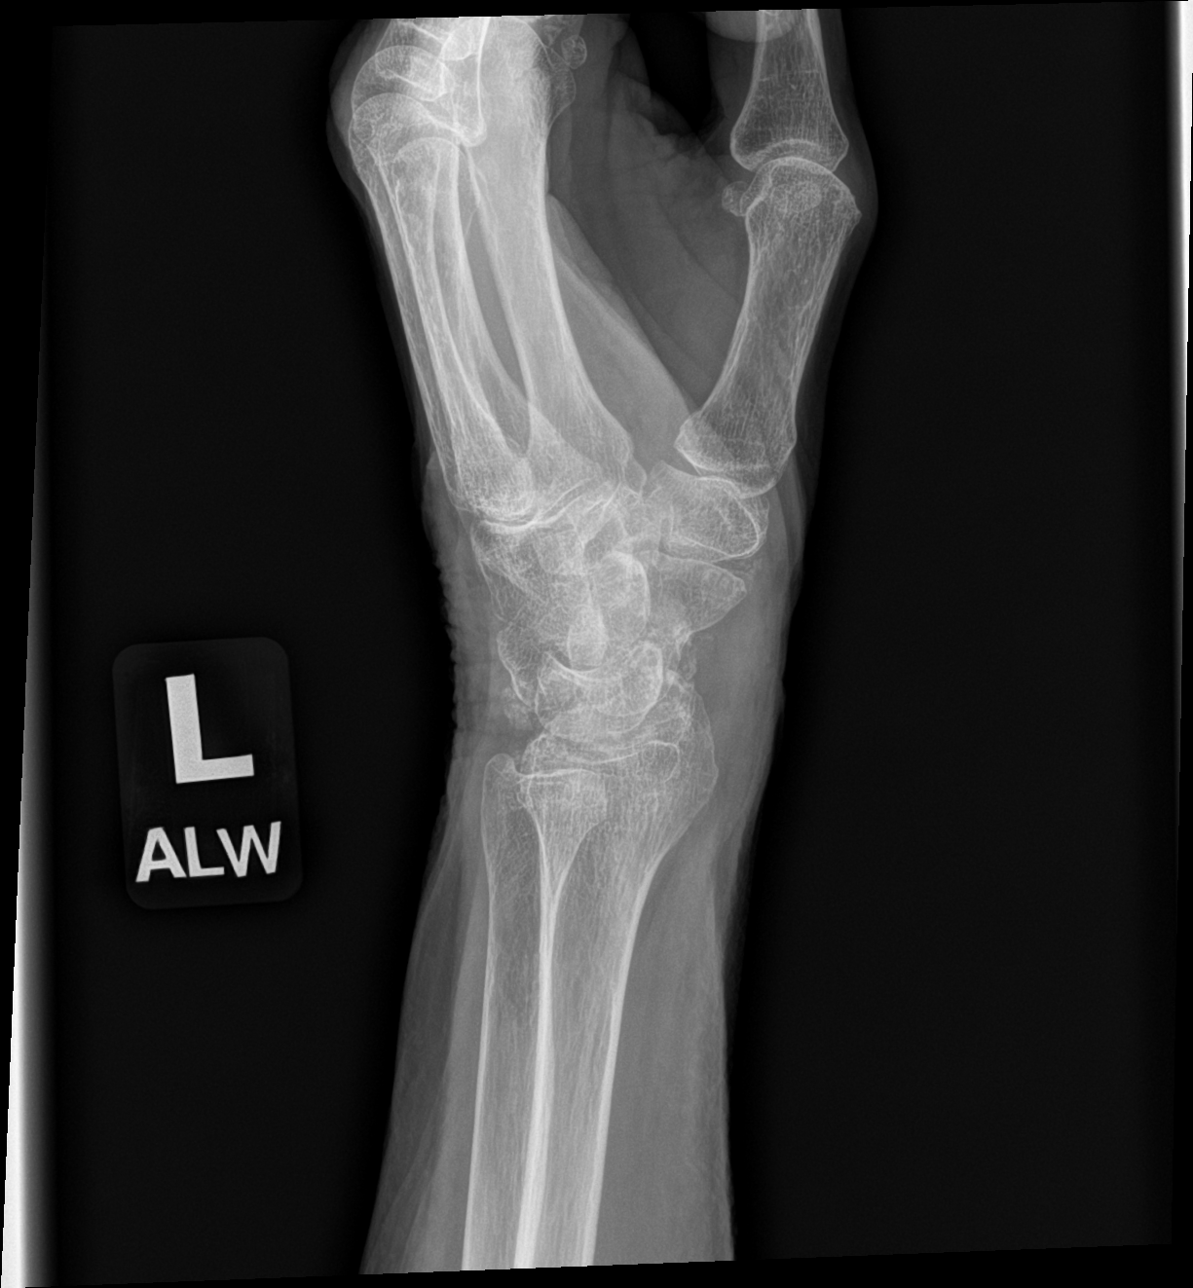
[im 3/4]
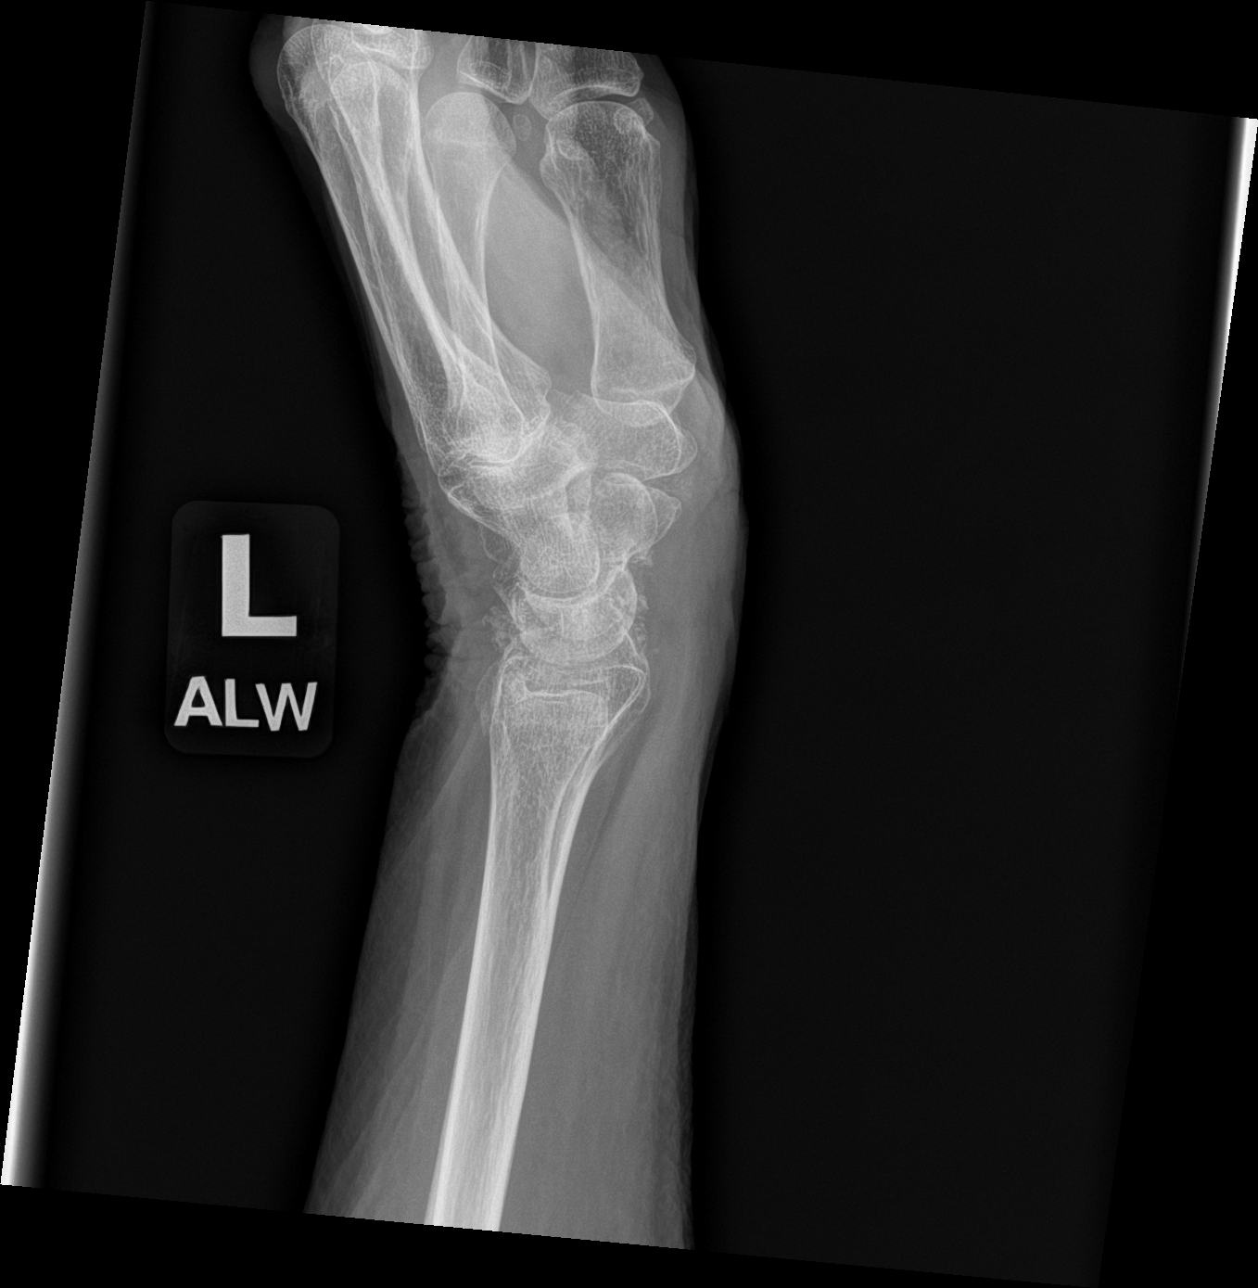
[im 4/4]
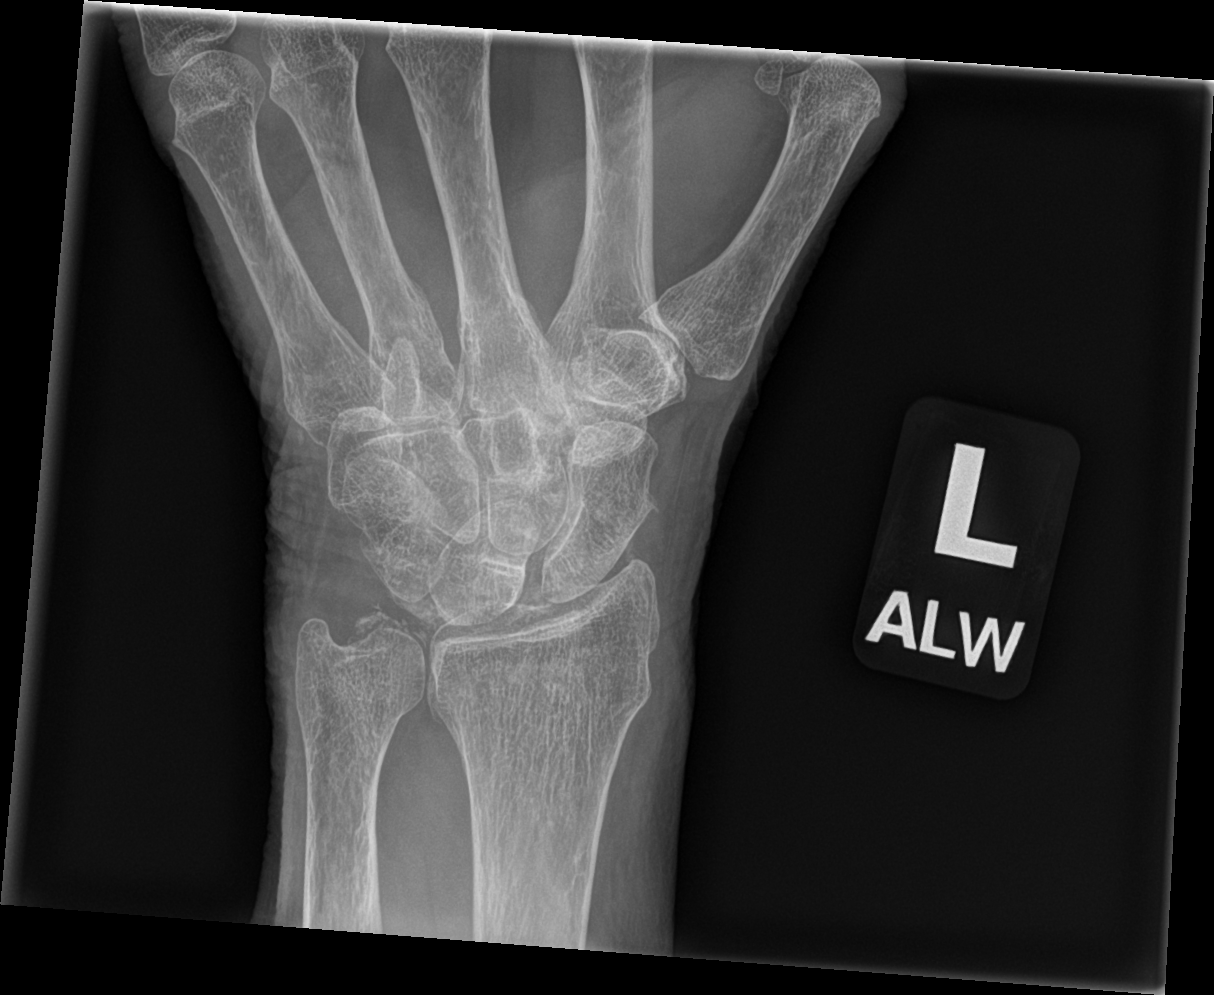

[4 of 4 positions shown; findings below may reference images not displayed]

FINDINGS: There is no evidence of fracture or dislocation. Chondrocalcinosis
is seen involving the radiocarpal joint consistent with degenerative
change. Soft tissues are unremarkable.
IMPRESSION: Degenerative changes as described above. No acute abnormality is
noted.

## 2020-04-30 NOTE — ED Provider Notes (Signed)
[facility] EMERGENCY DEPT Provider Note   CSN: [id number] Arrival date & time: 04/30/20  1521     History Chief Complaint  Patient presents with  . Wrist Injury    Angela [name] is a 80 y.o. female.  HPI Patient was doing a sewing project at home.  She got caught up in the cord of her iron and fell.  She broke her fall with her left outstretched hand.  She reports she has pain and swelling in the left wrist.  She can move it but is very uncomfortable.  She reports she just brushed the side of her face with her glasses against something and she fell.  No loss of consciousness and no headache.  Patient is not on any anticoagulants.  She was actually due to have a carpal tunnel surgery on that same hand tomorrow with Dr. Fredna Dow.    Past Medical History:  Diagnosis Date  . Carpal tunnel syndrome on right 10/08  . Elevated cholesterol 5/93  . Histoplasmosis   . Nephrolithiasis 2010  . Osteoporosis   . Polymyalgia ([place]) 2/97    Patient Active Problem List   Diagnosis Date Noted  . Essential hypertension 01/18/2020  . Spinal stenosis of lumbar region 01/17/2020  . Mixed hyperlipidemia 09/15/2019  . Statin intolerance 09/15/2019  . Coronary artery disease involving native coronary artery of native heart without angina pectoris 10/25/2018  . Elevated blood pressure reading without diagnosis of hypertension 10/25/2018  . Sudden left hearing loss 07/30/2018  . 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 History of total hip arthroplasty, left 07/10/2017  . Closed left hip fracture, initial encounter ([place]) 05/05/2017  . Polymyalgia ([place]) 07/08/2013    Past Surgical History:  Procedure Laterality Date  . BLEPHAROPLASTY Bilateral 2/14   Dr. Isidoro Donning  . CARPAL TUNNEL RELEASE Right 6/08  . CERVICAL DISCECTOMY  12/91  . KNEE SURGERY Left   . spots removed     frozen and biopsied-all benign  . TOTAL HIP ARTHROPLASTY Left 05/06/2017    Procedure: TOTAL ANTERIOR HIP ARTHROPLASTY -LEFT;  Surgeon: Marybelle Killings, MD;  Location: [facility];  Service: Orthopedics;  Laterality: Left;     OB History    Gravida  3   Para  3   Term      Preterm      AB      Living  3     SAB      IAB      Ectopic      Multiple      Live Births              Family History  Problem Relation Age of Onset  . Hypertension Mother   . Thyroid disease Mother   . Stroke Father     Social History   Tobacco Use  . Smoking status: Never Smoker  . Smokeless tobacco: Never Used  Vaping Use  . Vaping Use: Never used  Substance Use Topics  . Alcohol use: Not Currently  . Drug use: No    Home Medications Prior to Admission medications   Medication Sig Start Date End Date Taking? Authorizing Provider  amLODipine (NORVASC) 5 MG tablet Take 1 tablet (5 mg total) by mouth daily. 03/08/20   Patwardhan, Reynold Bowen, MD  Cholecalciferol (VITAMIN D-3 PO) Take 1 capsule by mouth daily.    [provider]  denosumab (PROLIA) 60 MG/ML SOSY injection Inject 60 mg into the skin every  6 (six) months.    [provider]  Multiple Vitamins-Minerals (MULTIVITAMIN PO) Take 1 tablet by mouth daily.     [provider]  Omega-3 Fatty Acids (FISH OIL PO) Take 1 capsule by mouth daily.     [provider]  pravastatin (PRAVACHOL) 80 MG tablet Take 1 tablet (80 mg total) by mouth every evening. 01/18/20   Patwardhan, Reynold Bowen, MD  SYMBICORT 160-4.5 MCG/ACT inhaler Inhale 2 puffs into the lungs 2 (two) times daily. 06/16/18   [provider]    Allergies    Other and Statins  Review of Systems   Review of Systems Constitutional: No fevers no chills no malaise Respiratory: No chest pain or shortness of breath. Physical Exam Updated Vital Signs BP (!) 201/65 (BP Location: Right Arm)   Pulse 76   Temp 98.7 F (37.1 C) (Oral)   Resp 18   Ht 5\' 6"  (1.676 m)   Wt 63.5 kg   LMP 01/13/1993   SpO2 100%   BMI  22.60 kg/m   Physical Exam Constitutional:      Appearance: Normal appearance. She is well-developed.  HENT:     Head: Normocephalic and atraumatic.  Eyes:     Extraocular Movements: Extraocular movements intact.  Cardiovascular:     Rate and Rhythm: Normal rate and regular rhythm.     Heart sounds: Normal heart sounds.  Pulmonary:     Effort: Pulmonary effort is normal.     Breath sounds: Normal breath sounds.  Chest:     Chest wall: No tenderness.  Abdominal:     General: Bowel sounds are normal. There is no distension.     Palpations: Abdomen is soft.     Tenderness: There is no abdominal tenderness.  Musculoskeletal:        General: Swelling and tenderness present.     Cervical back: Neck supple.     Comments: Mild swelling tenderness of the dorsum of the left wrist.  However, patient can do flexion extension range of motion without severe pain.  Grip strength slightly limited.  She reports this was pre-existing with her carpal tunnel.  Also some pain limitation.  No abrasions no erythema.  Good radial pulse 2+.  Hand is warm and dry.  No evidence of neurovascular injury.  Elbow and shoulder nontender  Skin:    General: Skin is warm and dry.  Neurological:     Mental Status: She is alert and oriented to person, place, and time.     GCS: GCS eye subscore is 4. GCS verbal subscore is 5. GCS motor subscore is 6.     Coordination: Coordination normal.  Psychiatric:        Mood and Affect: Mood normal.     ED Results / Procedures / Treatments   Labs (all labs ordered are listed, but only abnormal results are displayed) Labs Reviewed - No data to display  EKG None  Radiology DG Wrist Complete Left  Result Date: 04/30/2020 CLINICAL DATA:  Left wrist pain after fall. EXAM: LEFT WRIST - COMPLETE 3+ VIEW COMPARISON:  None. FINDINGS: There is no evidence of fracture or dislocation. Chondrocalcinosis is seen involving the radiocarpal joint consistent with degenerative change.  Soft tissues are unremarkable. IMPRESSION: Degenerative changes as described above. No acute abnormality is noted. Electronically Signed   By: Marijo Conception M.D.   On: 04/30/2020 15:59    Procedures Procedures   Medications Ordered in ED Medications - No data to display  ED Course  I have reviewed the triage vital signs and the nursing notes.  Pertinent labs & imaging results that were available during my care of the patient were reviewed by me and considered in my medical decision making (see chart for details).    MDM Rules/Calculators/A&P                          Patient fell on outstretched hand due to mechanical fall.  No signs of other significant injury.  Patient neurovascularly intact.  X-rays do not show any acute fracture.  Findings consistent with wrist sprain.  Will place in removable splint with recommendations for icing and elevating.  Patient will take ibuprofen as needed.  Patient is already established Dr. Fredna Dow.  Recommended follow-up in 3 to 5 days Final Clinical Impression(s) / ED Diagnoses Final diagnoses:  Sprain of left wrist, initial encounter    Rx / DC Orders ED Discharge Orders    None       Charlesetta Shanks, MD 04/30/20 [phone]

## 2020-04-30 NOTE — ED Notes (Signed)
Pt discharged home after verbalizing understanding of discharge instructions; nad noted. 

## 2020-04-30 NOTE — Discharge Instructions (Signed)
1.  Schedule follow-up appoint with Dr. Fredna Dow in the next 3 to 5 days. 2.  Wear the wrist splint when you are doing any at activities.  You may remove for showering. 3.  Apply well wrapped ice pack to your wrist for about 20 minutes every 2 hours for the next 1 to 2 days. 3.  You may take ibuprofen 400 mg 3 times a day for pain.

## 2020-04-30 NOTE — ED Notes (Signed)
ED Provider at bedside. 

## 2020-04-30 NOTE — ED Notes (Signed)
Dr. Johnney Killian aware of pt's BP.

## 2020-04-30 NOTE — ED Triage Notes (Signed)
L wrist pain after tripping and falling over her iron cord. She is scheduled for surgery on that wrist later this week .

## 2020-05-01 LAB — SARS CORONAVIRUS 2 (TAT 6-24 HRS): SARS Coronavirus 2: NEGATIVE

## 2020-05-02 DIAGNOSIS — G5603 Carpal tunnel syndrome, bilateral upper limbs: Secondary | ICD-10-CM | POA: Diagnosis not present

## 2020-05-08 ENCOUNTER — Other Ambulatory Visit: Payer: Self-pay

## 2020-05-08 ENCOUNTER — Encounter (HOSPITAL_BASED_OUTPATIENT_CLINIC_OR_DEPARTMENT_OTHER): Payer: Self-pay | Admitting: Orthopedic Surgery

## 2020-05-11 ENCOUNTER — Other Ambulatory Visit (HOSPITAL_COMMUNITY)
Admission: RE | Admit: 2020-05-11 | Discharge: 2020-05-11 | Disposition: A | Discharge status: Home / Self Care | Payer: Medicare Other | Source: Ambulatory Visit | Attending: Orthopedic Surgery | Admitting: Orthopedic Surgery

## 2020-05-11 DIAGNOSIS — Z01812 Encounter for preprocedural laboratory examination: Secondary | ICD-10-CM | POA: Diagnosis not present

## 2020-05-11 DIAGNOSIS — Z20822 Contact with and (suspected) exposure to covid-19: Secondary | ICD-10-CM | POA: Diagnosis not present

## 2020-05-11 NOTE — Progress Notes (Signed)

## 2020-05-12 LAB — SARS CORONAVIRUS 2 (TAT 6-24 HRS): SARS Coronavirus 2: NEGATIVE

## 2020-05-14 ENCOUNTER — Encounter (HOSPITAL_COMMUNITY): Payer: Self-pay | Admitting: Anesthesiology

## 2020-05-14 ENCOUNTER — Other Ambulatory Visit: Payer: Self-pay | Admitting: Cardiology

## 2020-05-14 DIAGNOSIS — S52532A Colles' fracture of left radius, initial encounter for closed fracture: Secondary | ICD-10-CM | POA: Diagnosis not present

## 2020-05-14 DIAGNOSIS — G5602 Carpal tunnel syndrome, left upper limb: Secondary | ICD-10-CM | POA: Diagnosis not present

## 2020-05-14 DIAGNOSIS — M25532 Pain in left wrist: Secondary | ICD-10-CM | POA: Diagnosis not present

## 2020-05-14 DIAGNOSIS — I2510 Atherosclerotic heart disease of native coronary artery without angina pectoris: Secondary | ICD-10-CM

## 2020-05-14 DIAGNOSIS — E782 Mixed hyperlipidemia: Secondary | ICD-10-CM

## 2020-05-14 DIAGNOSIS — G5603 Carpal tunnel syndrome, bilateral upper limbs: Secondary | ICD-10-CM | POA: Diagnosis not present

## 2020-05-14 NOTE — Anesthesia Preprocedure Evaluation (Deleted)
Anesthesia Evaluation    Reviewed: Allergy & Precautions, Patient's Chart, lab work & pertinent test results  Airway        Dental   Pulmonary neg pulmonary ROS,           Cardiovascular hypertension, Pt. on medications      Neuro/Psych negative psych ROS   GI/Hepatic negative GI ROS, Neg liver ROS,   Endo/Other  negative endocrine ROS  Renal/GU negative Renal ROS  negative genitourinary   Musculoskeletal L carpal tunnel syndrome    Abdominal   Peds  Hematology negative hematology ROS (+)   Anesthesia Other Findings   Reproductive/Obstetrics negative OB ROS                             Anesthesia Physical Anesthesia Plan  ASA: II  Anesthesia Plan: MAC and Bier Block and Bier Block-LIDOCAINE ONLY   Post-op Pain Management:    Induction:   PONV Risk Score and Plan: 2 and Propofol infusion and TIVA  Airway Management Planned: Natural Airway and Simple Face Mask  Additional Equipment: None  Intra-op Plan:   Post-operative Plan:   Informed Consent:   Plan Discussed with:   Anesthesia Plan Comments:         Anesthesia Quick Evaluation

## 2020-05-15 ENCOUNTER — Ambulatory Visit (HOSPITAL_BASED_OUTPATIENT_CLINIC_OR_DEPARTMENT_OTHER): Admission: RE | Admit: 2020-05-15 | Payer: Medicare Other | Source: Home / Self Care | Admitting: Orthopedic Surgery

## 2020-05-15 HISTORY — DX: Histoplasmosis, unspecified: B39.9

## 2020-05-15 SURGERY — CARPAL TUNNEL RELEASE
Anesthesia: Regional | Laterality: Left

## 2020-05-28 DIAGNOSIS — G5603 Carpal tunnel syndrome, bilateral upper limbs: Secondary | ICD-10-CM | POA: Diagnosis not present

## 2020-05-28 DIAGNOSIS — S52532A Colles' fracture of left radius, initial encounter for closed fracture: Secondary | ICD-10-CM | POA: Diagnosis not present

## 2020-06-12 ENCOUNTER — Other Ambulatory Visit: Payer: Self-pay | Admitting: Cardiology

## 2020-06-12 DIAGNOSIS — I2584 Coronary atherosclerosis due to calcified coronary lesion: Secondary | ICD-10-CM

## 2020-06-12 DIAGNOSIS — E782 Mixed hyperlipidemia: Secondary | ICD-10-CM

## 2020-06-12 DIAGNOSIS — I2510 Atherosclerotic heart disease of native coronary artery without angina pectoris: Secondary | ICD-10-CM

## 2020-06-20 DIAGNOSIS — S52532D Colles' fracture of left radius, subsequent encounter for closed fracture with routine healing: Secondary | ICD-10-CM | POA: Diagnosis not present

## 2020-06-20 DIAGNOSIS — S52532A Colles' fracture of left radius, initial encounter for closed fracture: Secondary | ICD-10-CM | POA: Diagnosis not present

## 2020-06-22 DIAGNOSIS — M25632 Stiffness of left wrist, not elsewhere classified: Secondary | ICD-10-CM | POA: Diagnosis not present

## 2020-06-22 DIAGNOSIS — S52532D Colles' fracture of left radius, subsequent encounter for closed fracture with routine healing: Secondary | ICD-10-CM | POA: Diagnosis not present

## 2020-07-04 DIAGNOSIS — M25632 Stiffness of left wrist, not elsewhere classified: Secondary | ICD-10-CM | POA: Diagnosis not present

## 2020-07-04 DIAGNOSIS — S52532D Colles' fracture of left radius, subsequent encounter for closed fracture with routine healing: Secondary | ICD-10-CM | POA: Diagnosis not present

## 2020-07-10 DIAGNOSIS — M25632 Stiffness of left wrist, not elsewhere classified: Secondary | ICD-10-CM | POA: Diagnosis not present

## 2020-07-10 DIAGNOSIS — D3132 Benign neoplasm of left choroid: Secondary | ICD-10-CM | POA: Diagnosis not present

## 2020-07-10 DIAGNOSIS — Z961 Presence of intraocular lens: Secondary | ICD-10-CM | POA: Diagnosis not present

## 2020-07-10 DIAGNOSIS — S52532D Colles' fracture of left radius, subsequent encounter for closed fracture with routine healing: Secondary | ICD-10-CM | POA: Diagnosis not present

## 2020-07-10 DIAGNOSIS — H02403 Unspecified ptosis of bilateral eyelids: Secondary | ICD-10-CM | POA: Diagnosis not present

## 2020-07-10 DIAGNOSIS — H52203 Unspecified astigmatism, bilateral: Secondary | ICD-10-CM | POA: Diagnosis not present

## 2020-07-11 DIAGNOSIS — E782 Mixed hyperlipidemia: Secondary | ICD-10-CM | POA: Diagnosis not present

## 2020-07-12 LAB — LIPID PANEL
Chol/HDL Ratio: 2.3 ratio (ref 0.0–4.4)
Cholesterol, Total: 191 mg/dL (ref 100–199)
HDL: 83 mg/dL (ref >39)
LDL Chol Calc (NIH): 91 mg/dL (ref 0–99)
Triglycerides: 96 mg/dL (ref 0–149)
VLDL Cholesterol Cal: 17 mg/dL (ref 5–40)

## 2020-07-17 DIAGNOSIS — M25632 Stiffness of left wrist, not elsewhere classified: Secondary | ICD-10-CM | POA: Diagnosis not present

## 2020-07-17 DIAGNOSIS — S52532D Colles' fracture of left radius, subsequent encounter for closed fracture with routine healing: Secondary | ICD-10-CM | POA: Diagnosis not present

## 2020-07-18 ENCOUNTER — Ambulatory Visit: Payer: Medicare Other | Admitting: Cardiology

## 2020-07-18 ENCOUNTER — Encounter: Payer: Self-pay | Admitting: Cardiology

## 2020-07-18 ENCOUNTER — Other Ambulatory Visit: Payer: Self-pay

## 2020-07-18 VITALS — BP 153/52 | HR 62 | Temp 97.9°F | Resp 16 | Ht 66.0 in | Wt 146.0 lb

## 2020-07-18 DIAGNOSIS — I2584 Coronary atherosclerosis due to calcified coronary lesion: Secondary | ICD-10-CM | POA: Diagnosis not present

## 2020-07-18 DIAGNOSIS — E782 Mixed hyperlipidemia: Secondary | ICD-10-CM | POA: Diagnosis not present

## 2020-07-18 DIAGNOSIS — I2510 Atherosclerotic heart disease of native coronary artery without angina pectoris: Secondary | ICD-10-CM | POA: Diagnosis not present

## 2020-07-18 MED ORDER — SIMVASTATIN 20 MG PO TABS: 20.0000 mg | ORAL_TABLET | Freq: Every day | ORAL | 3 refills | Status: DC

## 2020-07-18 NOTE — Progress Notes (Signed)
Follow up visit  Subjective:   Angela [name], female    DOB: 25-Aug-1940, 80 y.o.   MRN: [id number]   HPI   Chief Complaint  Patient presents with   Coronary artery disease involving native coronary artery of   Follow-up    80 y.o. Caucasian female with hypertension, hyperlipidemia, coronary atherosclerosis, [name]  As usual, office blood pressure is elevated with blood pressure at home being normal.  About a week ago, patient stopped pravastatin.  She reported symptoms of nausea and depression which seem to be improving after stopping pravastatin.  Patient has been intolerant to several statins, including atorvastatin, rosuvastatin, as well as Zetia.  Recent lipid panel reviewed with the patient, which was checked before her discontinuation of pravastatin.   Current Outpatient Medications on File Prior to Visit  Medication Sig Dispense Refill   Cholecalciferol (VITAMIN D-3 PO) Take 1 capsule by mouth daily.     denosumab (PROLIA) 60 MG/ML SOSY injection Inject 60 mg into the skin every 6 (six) months.     Multiple Vitamins-Minerals (MULTIVITAMIN PO) Take 1 tablet by mouth daily.      Omega-3 Fatty Acids (FISH OIL PO) Take 1 capsule by mouth daily.      SYMBICORT 160-4.5 MCG/ACT inhaler Inhale 2 puffs into the lungs 2 (two) times daily.     No current facility-administered medications on file prior to visit.    Cardiovascular & other pertient studies:  EKG 07/18/2020: Sinus rhythm 59 bpm  Echocardiogram 05/02/2019:  Normal LV systolic function with visual EF 60-65%. Left ventricle cavity is normal in size. Normal global wall motion.  Normal diastolic filling pattern, normal LAP. Calculated EF 61%.  Mild (Grade I) aortic regurgitation.  Mild (Grade I) mitral regurgitation.  No prior study for comparison.  Lexiscan myoview stress test 10/12/2017: 1. The resting electrocardiogram demonstrated normal sinus rhythm, normal resting conduction, no resting arrhythmias and  normal rest repolarization. Stress EKG is nondiagnostic for ischemia as a pharmacologic stress test. 2. Myocardial perfusion imaging is normal. Overall left ventricular systolic function was normal without regional wall motion abnormalities. The left ventricular ejection fraction was 82%. 3. Low risk study.   CT Chest 09/25/2017: 1. Small nonspecific pulmonary nodules within the RIGHT lung, both measuring 4 mm with 1 likely corresponding to the recent chest x-ray finding, as well as chronic appearing nodular scarring/atelectasis along the pleura of the RIGHT upper lobe.  Recommend follow-up noncontrast chest CT in 12 months to ensure stability of all findings. 2. Coronary artery calcifications, particularly dense within the LEFT main and LEFT anterior descending coronary arteries.  Recommend correlation with any possible associated cardiac symptoms.  Recent labs: 07/11/2020: Chol 191, TG 96, HDL 83, LDL 91  12/30/2019: Chol 184, TG 80, HDL 80, LDL 89  08/31/2019: Glucose 81, BUN/Cr 15/0.7. EGFR 85 Chol 265, TG 140, HDL 75, LDL 165   Lipid panel 11/01/2018: Cholesterol 169, triglycerides 83, HDL 79, LDL 75.  09/30/2017: Glucose 85. BUN/creatinine 12/0.67.  EGFR 86/104.  Sodium 140, potassium 4.7.  Rest of the CMP normal. Cholesterol 230, triglycerides 52, HDL 73, LDL 146    Review of Systems  Cardiovascular:  Negative for chest pain, dyspnea on exertion, leg swelling, palpitations and syncope.        Vitals:   07/18/20 0920  BP: (!) 153/52  Pulse: 62  Resp: 16  Temp: 97.9 F (36.6 C)  SpO2: 97%     Body mass index is 23.57 kg/m. Filed Weights  07/18/20 0920  Weight: 146 lb (66.2 kg)     Objective:   Physical Exam Vitals and nursing note reviewed.  Constitutional:      General: She is not in acute distress. Neck:     Vascular: No JVD.  Cardiovascular:     Rate and Rhythm: Normal rate and regular rhythm.     Heart sounds: Normal heart sounds. No murmur  heard. Pulmonary:     Effort: Pulmonary effort is normal.     Breath sounds: Normal breath sounds. No wheezing or rales.  Musculoskeletal:     Right lower leg: No edema.     Left lower leg: No edema.        Assessment & Recommendations:   80 y.o. Caucasian female with hypertension, hyperlipidemia, coronary atherosclerosis, [name]  Coronary artery calcification: No angina symptoms. Nuclear stress test with no iscehmia, infarction (09/2017). Continue risk factor modification. Continue aspirin 81 mg daily. LDL 165-->89 on pravastatin 20 mg. HDL 80. However, now off pravastatin due to side effects.   Also intolerant to atorvastatin, rosuvastatin, Zetia.  PCSK9 inhibitor not approved by insurance.  Will try simvastatin 20 mg.  White coat hypertension: Continue home monitoring for now.  Mild AI, MR: Clinically asymptomatic  F/u in 4 weeks to assess tolerance to simvastatin   [name], MD [facility] Pager: [phone] Office: [phone]

## 2020-07-20 DIAGNOSIS — G5602 Carpal tunnel syndrome, left upper limb: Secondary | ICD-10-CM | POA: Diagnosis not present

## 2020-07-20 DIAGNOSIS — S52532D Colles' fracture of left radius, subsequent encounter for closed fracture with routine healing: Secondary | ICD-10-CM | POA: Diagnosis not present

## 2020-07-20 DIAGNOSIS — M25632 Stiffness of left wrist, not elsewhere classified: Secondary | ICD-10-CM | POA: Diagnosis not present

## 2020-07-24 DIAGNOSIS — S52532D Colles' fracture of left radius, subsequent encounter for closed fracture with routine healing: Secondary | ICD-10-CM | POA: Diagnosis not present

## 2020-07-24 DIAGNOSIS — M25632 Stiffness of left wrist, not elsewhere classified: Secondary | ICD-10-CM | POA: Diagnosis not present

## 2020-07-24 DIAGNOSIS — Z1231 Encounter for screening mammogram for malignant neoplasm of breast: Secondary | ICD-10-CM | POA: Diagnosis not present

## 2020-07-31 DIAGNOSIS — S52532D Colles' fracture of left radius, subsequent encounter for closed fracture with routine healing: Secondary | ICD-10-CM | POA: Diagnosis not present

## 2020-07-31 DIAGNOSIS — M25632 Stiffness of left wrist, not elsewhere classified: Secondary | ICD-10-CM | POA: Diagnosis not present

## 2020-07-31 DIAGNOSIS — R29898 Other symptoms and signs involving the musculoskeletal system: Secondary | ICD-10-CM | POA: Diagnosis not present

## 2020-08-07 DIAGNOSIS — R29898 Other symptoms and signs involving the musculoskeletal system: Secondary | ICD-10-CM | POA: Diagnosis not present

## 2020-08-07 DIAGNOSIS — M25632 Stiffness of left wrist, not elsewhere classified: Secondary | ICD-10-CM | POA: Diagnosis not present

## 2020-08-07 DIAGNOSIS — S52532D Colles' fracture of left radius, subsequent encounter for closed fracture with routine healing: Secondary | ICD-10-CM | POA: Diagnosis not present

## 2020-08-13 DIAGNOSIS — H02403 Unspecified ptosis of bilateral eyelids: Secondary | ICD-10-CM | POA: Diagnosis not present

## 2020-08-14 DIAGNOSIS — S52532D Colles' fracture of left radius, subsequent encounter for closed fracture with routine healing: Secondary | ICD-10-CM | POA: Diagnosis not present

## 2020-08-14 DIAGNOSIS — M25632 Stiffness of left wrist, not elsewhere classified: Secondary | ICD-10-CM | POA: Diagnosis not present

## 2020-08-14 DIAGNOSIS — R29898 Other symptoms and signs involving the musculoskeletal system: Secondary | ICD-10-CM | POA: Diagnosis not present

## 2020-08-15 ENCOUNTER — Ambulatory Visit: Payer: Medicare Other | Admitting: Cardiology

## 2020-08-15 ENCOUNTER — Encounter: Payer: Self-pay | Admitting: Cardiology

## 2020-08-15 ENCOUNTER — Other Ambulatory Visit: Payer: Self-pay

## 2020-08-15 VITALS — BP 154/64 | HR 70 | Temp 98.3°F | Resp 16 | Ht 66.0 in | Wt 147.0 lb

## 2020-08-15 DIAGNOSIS — I10 Essential (primary) hypertension: Secondary | ICD-10-CM | POA: Diagnosis not present

## 2020-08-15 DIAGNOSIS — E782 Mixed hyperlipidemia: Secondary | ICD-10-CM | POA: Diagnosis not present

## 2020-08-15 DIAGNOSIS — I2510 Atherosclerotic heart disease of native coronary artery without angina pectoris: Secondary | ICD-10-CM | POA: Diagnosis not present

## 2020-08-15 DIAGNOSIS — I2584 Coronary atherosclerosis due to calcified coronary lesion: Secondary | ICD-10-CM | POA: Diagnosis not present

## 2020-08-15 MED ORDER — ROSUVASTATIN CALCIUM 10 MG PO TABS: 20.0000 mg | ORAL_TABLET | ORAL | 3 refills | Status: DC

## 2020-08-15 NOTE — Progress Notes (Signed)
Follow up visit  Subjective:   Angela [name], female    DOB: 1940-01-28, 80 y.o.   MRN: [id number]   HPI   Chief Complaint  Patient presents with   Coronary Artery Disease   Hypertension   Hyperlipidemia   Follow-up    4 week    80 y.o. Caucasian female with hypertension, hyperlipidemia, coronary atherosclerosis, [name]  Patient did not tolerate simvastatin due to nausea.  In the past she has had somewhat similar symptoms with pravastatin, atorvastatin, as well as Zetia.  Rosuvastatin seem to be tolerated the best, although not devoid of side effects.  She is willing to try rosuvastatin every other day before moving to injections.   Current Outpatient Medications on File Prior to Visit  Medication Sig Dispense Refill   Cholecalciferol (VITAMIN D-3 PO) Take 1 capsule by mouth daily.     denosumab (PROLIA) 60 MG/ML SOSY injection Inject 60 mg into the skin every 6 (six) months.     Multiple Vitamins-Minerals (MULTIVITAMIN PO) Take 1 tablet by mouth daily.      Omega-3 Fatty Acids (FISH OIL PO) Take 1 capsule by mouth daily.      SYMBICORT 160-4.5 MCG/ACT inhaler Inhale 2 puffs into the lungs 2 (two) times daily.     No current facility-administered medications on file prior to visit.    Cardiovascular & other pertient studies:  EKG 07/18/2020: Sinus rhythm 59 bpm  Echocardiogram 05/02/2019:  Normal LV systolic function with visual EF 60-65%. Left ventricle cavity is normal in size. Normal global wall motion.  Normal diastolic filling pattern, normal LAP. Calculated EF 61%.  Mild (Grade I) aortic regurgitation.  Mild (Grade I) mitral regurgitation.  No prior study for comparison.  Lexiscan myoview stress test 10/12/2017: 1. The resting electrocardiogram demonstrated normal sinus rhythm, normal resting conduction, no resting arrhythmias and normal rest repolarization. Stress EKG is nondiagnostic for ischemia as a pharmacologic stress test. 2. Myocardial perfusion  imaging is normal. Overall left ventricular systolic function was normal without regional wall motion abnormalities. The left ventricular ejection fraction was 82%. 3. Low risk study.   CT Chest 09/25/2017: 1. Small nonspecific pulmonary nodules within the RIGHT lung, both measuring 4 mm with 1 likely corresponding to the recent chest x-ray finding, as well as chronic appearing nodular scarring/atelectasis along the pleura of the RIGHT upper lobe.  Recommend follow-up noncontrast chest CT in 12 months to ensure stability of all findings. 2. Coronary artery calcifications, particularly dense within the LEFT main and LEFT anterior descending coronary arteries.  Recommend correlation with any possible associated cardiac symptoms.  Recent labs: 07/11/2020: Chol 191, TG 96, HDL 83, LDL 91  12/30/2019: Chol 184, TG 80, HDL 80, LDL 89  08/31/2019: Glucose 81, BUN/Cr 15/0.7. EGFR 85 Chol 265, TG 140, HDL 75, LDL 165   Lipid panel 11/01/2018: Cholesterol 169, triglycerides 83, HDL 79, LDL 75.  09/30/2017: Glucose 85. BUN/creatinine 12/0.67.  EGFR 86/104.  Sodium 140, potassium 4.7.  Rest of the CMP normal. Cholesterol 230, triglycerides 52, HDL 73, LDL 146    Review of Systems  Cardiovascular:  Negative for chest pain, dyspnea on exertion, leg swelling, palpitations and syncope.  Gastrointestinal:  Positive for nausea (With simvastatin, resolved after stopping it).   No change in exam compared to last visit by me      Vitals:   08/15/20 0932  BP: (!) 154/64  Pulse: 70  Resp: 16  Temp: 98.3 F (36.8 C)  SpO2: 97%  Body mass index is 23.73 kg/m. Filed Weights   08/15/20 0932  Weight: 147 lb (66.7 kg)     Objective:   Physical Exam Vitals and nursing note reviewed.  Constitutional:      General: She is not in acute distress. Neck:     Vascular: No JVD.  Cardiovascular:     Rate and Rhythm: Normal rate and regular rhythm.     Heart sounds: Normal heart sounds.  No murmur heard. Pulmonary:     Effort: Pulmonary effort is normal.     Breath sounds: Normal breath sounds. No wheezing or rales.  Musculoskeletal:     Right lower leg: No edema.     Left lower leg: No edema.        Assessment & Recommendations:   80 y.o. Caucasian female with hypertension, hyperlipidemia, coronary atherosclerosis, [name]  Coronary artery calcification: No angina symptoms. Nuclear stress test with no iscehmia, infarction (09/2017). Continue risk factor modification. Okay not to be on Aspirin in absence of obstructive CAD Statin intolerance to several statins. Willing to try rosuvastatin 10 mg every other day. In the past, PCSK9 inhibitor was not approved by insurance.   White coat hypertension: Continue home monitoring for now.  Mild AI, MR: Clinically asymptomatic  F/u in 3 months after repeat lipid panel   [name], MD [facility] Pager: [phone] Office: [phone]

## 2020-08-27 ENCOUNTER — Other Ambulatory Visit: Payer: Self-pay | Admitting: Orthopedic Surgery

## 2020-08-27 DIAGNOSIS — S52532D Colles' fracture of left radius, subsequent encounter for closed fracture with routine healing: Secondary | ICD-10-CM | POA: Diagnosis not present

## 2020-08-27 DIAGNOSIS — G5603 Carpal tunnel syndrome, bilateral upper limbs: Secondary | ICD-10-CM | POA: Diagnosis not present

## 2020-08-29 DIAGNOSIS — L578 Other skin changes due to chronic exposure to nonionizing radiation: Secondary | ICD-10-CM | POA: Diagnosis not present

## 2020-08-29 DIAGNOSIS — L814 Other melanin hyperpigmentation: Secondary | ICD-10-CM | POA: Diagnosis not present

## 2020-08-29 DIAGNOSIS — D225 Melanocytic nevi of trunk: Secondary | ICD-10-CM | POA: Diagnosis not present

## 2020-08-29 DIAGNOSIS — L821 Other seborrheic keratosis: Secondary | ICD-10-CM | POA: Diagnosis not present

## 2020-08-29 DIAGNOSIS — Z85828 Personal history of other malignant neoplasm of skin: Secondary | ICD-10-CM | POA: Diagnosis not present

## 2020-09-04 ENCOUNTER — Other Ambulatory Visit: Payer: Self-pay

## 2020-09-04 ENCOUNTER — Encounter (HOSPITAL_BASED_OUTPATIENT_CLINIC_OR_DEPARTMENT_OTHER): Payer: Self-pay | Admitting: Orthopedic Surgery

## 2020-09-10 DIAGNOSIS — Z0000 Encounter for general adult medical examination without abnormal findings: Secondary | ICD-10-CM | POA: Diagnosis not present

## 2020-09-10 DIAGNOSIS — M810 Age-related osteoporosis without current pathological fracture: Secondary | ICD-10-CM | POA: Diagnosis not present

## 2020-09-10 DIAGNOSIS — Z79899 Other long term (current) drug therapy: Secondary | ICD-10-CM | POA: Diagnosis not present

## 2020-09-10 DIAGNOSIS — I700 Atherosclerosis of aorta: Secondary | ICD-10-CM | POA: Diagnosis not present

## 2020-09-10 DIAGNOSIS — Z1159 Encounter for screening for other viral diseases: Secondary | ICD-10-CM | POA: Diagnosis not present

## 2020-09-10 DIAGNOSIS — Z8619 Personal history of other infectious and parasitic diseases: Secondary | ICD-10-CM | POA: Diagnosis not present

## 2020-09-10 DIAGNOSIS — E7800 Pure hypercholesterolemia, unspecified: Secondary | ICD-10-CM | POA: Diagnosis not present

## 2020-09-11 ENCOUNTER — Ambulatory Visit (HOSPITAL_BASED_OUTPATIENT_CLINIC_OR_DEPARTMENT_OTHER): Payer: Medicare Other | Admitting: Certified Registered"

## 2020-09-11 ENCOUNTER — Encounter (HOSPITAL_BASED_OUTPATIENT_CLINIC_OR_DEPARTMENT_OTHER): Payer: Self-pay | Admitting: Orthopedic Surgery

## 2020-09-11 ENCOUNTER — Encounter (HOSPITAL_BASED_OUTPATIENT_CLINIC_OR_DEPARTMENT_OTHER)
Admission: RE | Disposition: A | Discharge status: Home / Self Care | Payer: Self-pay | Source: Home / Self Care | Attending: Orthopedic Surgery

## 2020-09-11 ENCOUNTER — Ambulatory Visit (HOSPITAL_BASED_OUTPATIENT_CLINIC_OR_DEPARTMENT_OTHER)
Admission: RE | Admit: 2020-09-11 | Discharge: 2020-09-11 | Disposition: A | Discharge status: Home / Self Care | Payer: Medicare Other | Attending: Orthopedic Surgery | Admitting: Orthopedic Surgery

## 2020-09-11 ENCOUNTER — Other Ambulatory Visit: Payer: Self-pay

## 2020-09-11 DIAGNOSIS — I2510 Atherosclerotic heart disease of native coronary artery without angina pectoris: Secondary | ICD-10-CM | POA: Diagnosis not present

## 2020-09-11 DIAGNOSIS — I10 Essential (primary) hypertension: Secondary | ICD-10-CM | POA: Diagnosis not present

## 2020-09-11 DIAGNOSIS — G5602 Carpal tunnel syndrome, left upper limb: Secondary | ICD-10-CM | POA: Insufficient documentation

## 2020-09-11 DIAGNOSIS — Z96642 Presence of left artificial hip joint: Secondary | ICD-10-CM | POA: Diagnosis not present

## 2020-09-11 DIAGNOSIS — Z8249 Family history of ischemic heart disease and other diseases of the circulatory system: Secondary | ICD-10-CM | POA: Insufficient documentation

## 2020-09-11 DIAGNOSIS — E782 Mixed hyperlipidemia: Secondary | ICD-10-CM | POA: Diagnosis not present

## 2020-09-11 HISTORY — PX: CARPAL TUNNEL RELEASE: SHX101

## 2020-09-11 HISTORY — DX: Unspecified osteoarthritis, unspecified site: M19.90

## 2020-09-11 SURGERY — CARPAL TUNNEL RELEASE
Anesthesia: Regional | Site: Hand | Laterality: Left

## 2020-09-11 MED ORDER — LIDOCAINE HCL (PF) 0.5 % IJ SOLN
INTRAMUSCULAR | Status: DC | PRN
  Administered 2020-09-11: 30 mL via INTRAVENOUS

## 2020-09-11 MED ORDER — LIDOCAINE HCL (CARDIAC) PF 100 MG/5ML IV SOSY
PREFILLED_SYRINGE | INTRAVENOUS | Status: DC | PRN
  Administered 2020-09-11: 30 mg via INTRAVENOUS

## 2020-09-11 MED ORDER — LACTATED RINGERS IV SOLN: INTRAVENOUS | Status: DC

## 2020-09-11 MED ORDER — OXYCODONE HCL 5 MG PO TABS: 5.0000 mg | ORAL_TABLET | Freq: Once | ORAL | Status: DC | PRN

## 2020-09-11 MED ORDER — PROPOFOL 500 MG/50ML IV EMUL
INTRAVENOUS | Status: DC | PRN
  Administered 2020-09-11: 50 ug/kg/min via INTRAVENOUS

## 2020-09-11 MED ORDER — FENTANYL CITRATE (PF) 100 MCG/2ML IJ SOLN
INTRAMUSCULAR | Status: DC | PRN
  Administered 2020-09-11: 50 ug via INTRAVENOUS

## 2020-09-11 MED ORDER — BUPIVACAINE HCL (PF) 0.25 % IJ SOLN
INTRAMUSCULAR | Status: DC | PRN
  Administered 2020-09-11: 7 mL

## 2020-09-11 MED ORDER — HYDROMORPHONE HCL 1 MG/ML IJ SOLN: 0.2500 mg | INTRAMUSCULAR | Status: DC | PRN

## 2020-09-11 MED ORDER — TRAMADOL HCL 50 MG PO TABS: 50.0000 mg | ORAL_TABLET | Freq: Four times a day (QID) | ORAL | 0 refills | Status: DC | PRN

## 2020-09-11 MED ORDER — PROMETHAZINE HCL 25 MG/ML IJ SOLN: 6.2500 mg | INTRAMUSCULAR | Status: DC | PRN

## 2020-09-11 MED ORDER — FENTANYL CITRATE (PF) 100 MCG/2ML IJ SOLN
INTRAMUSCULAR | Status: AC
  Filled 2020-09-11: qty 4

## 2020-09-11 MED ORDER — CEFAZOLIN SODIUM-DEXTROSE 2-4 GM/100ML-% IV SOLN
2.0000 g | INTRAVENOUS | Status: AC
  Administered 2020-09-11: 2 g via INTRAVENOUS

## 2020-09-11 MED ORDER — CEFAZOLIN SODIUM-DEXTROSE 2-4 GM/100ML-% IV SOLN
INTRAVENOUS | Status: AC
  Filled 2020-09-11: qty 100

## 2020-09-11 MED ORDER — OXYCODONE HCL 5 MG/5ML PO SOLN: 5.0000 mg | Freq: Once | ORAL | Status: DC | PRN

## 2020-09-11 MED ORDER — 0.9 % SODIUM CHLORIDE (POUR BTL) OPTIME
TOPICAL | Status: DC | PRN
  Administered 2020-09-11: 100 mL

## 2020-09-11 SURGICAL SUPPLY — 35 items
APL PRP STRL LF DISP 70% ISPRP (MISCELLANEOUS) ×1
BLADE SURG 15 STRL LF DISP TIS (BLADE) ×1 IMPLANT
BLADE SURG 15 STRL SS (BLADE) ×2
BNDG CMPR 9X4 STRL LF SNTH (GAUZE/BANDAGES/DRESSINGS)
BNDG COHESIVE 3X5 TAN STRL LF (GAUZE/BANDAGES/DRESSINGS) ×2 IMPLANT
BNDG ESMARK 4X9 LF (GAUZE/BANDAGES/DRESSINGS) IMPLANT
BNDG GAUZE ELAST 4 BULKY (GAUZE/BANDAGES/DRESSINGS) ×2 IMPLANT
CHLORAPREP W/TINT 26 (MISCELLANEOUS) ×2 IMPLANT
CORD BIPOLAR FORCEPS 12FT (ELECTRODE) ×2 IMPLANT
COVER BACK TABLE 60X90IN (DRAPES) ×2 IMPLANT
COVER MAYO STAND STRL (DRAPES) ×2 IMPLANT
CUFF TOURN SGL QUICK 18X4 (TOURNIQUET CUFF) ×2 IMPLANT
DRAPE EXTREMITY T 121X128X90 (DISPOSABLE) ×2 IMPLANT
DRAPE SURG 17X23 STRL (DRAPES) ×2 IMPLANT
DRSG PAD ABDOMINAL 8X10 ST (GAUZE/BANDAGES/DRESSINGS) ×2 IMPLANT
GAUZE SPONGE 4X4 12PLY STRL (GAUZE/BANDAGES/DRESSINGS) ×2 IMPLANT
GAUZE XEROFORM 1X8 LF (GAUZE/BANDAGES/DRESSINGS) ×2 IMPLANT
GLOVE SURG ENC MOIS LTX SZ7 (GLOVE) ×1 IMPLANT
GLOVE SURG ORTHO LTX SZ8 (GLOVE) ×3 IMPLANT
GLOVE SURG POLYISO LF SZ7 (GLOVE) ×1 IMPLANT
GLOVE SURG UNDER POLY LF SZ8.5 (GLOVE) ×3 IMPLANT
GOWN STRL REUS W/ TWL LRG LVL3 (GOWN DISPOSABLE) ×1 IMPLANT
GOWN STRL REUS W/TWL LRG LVL3 (GOWN DISPOSABLE) ×2
GOWN STRL REUS W/TWL XL LVL3 (GOWN DISPOSABLE) ×2 IMPLANT
NDL PRECISIONGLIDE 27X1.5 (NEEDLE) IMPLANT
NEEDLE PRECISIONGLIDE 27X1.5 (NEEDLE) IMPLANT
NS IRRIG 1000ML POUR BTL (IV SOLUTION) ×2 IMPLANT
PACK BASIN DAY SURGERY FS (CUSTOM PROCEDURE TRAY) ×2 IMPLANT
STOCKINETTE 4X48 STRL (DRAPES) ×2 IMPLANT
SUT ETHILON 4 0 PS 2 18 (SUTURE) ×2 IMPLANT
SUT VICRYL 4-0 PS2 18IN ABS (SUTURE) IMPLANT
SYR BULB EAR ULCER 3OZ GRN STR (SYRINGE) ×2 IMPLANT
SYR CONTROL 10ML LL (SYRINGE) IMPLANT
TOWEL GREEN STERILE FF (TOWEL DISPOSABLE) ×2 IMPLANT
UNDERPAD 30X36 HEAVY ABSORB (UNDERPADS AND DIAPERS) ×2 IMPLANT

## 2020-09-11 NOTE — Anesthesia Postprocedure Evaluation (Signed)
Anesthesia Post Note  Patient: Angela [name]  Procedure(s) Performed: LEFT CARPAL TUNNEL RELEASE (Left: Hand)     Patient location during evaluation: PACU Anesthesia Type: Bier Block Level of consciousness: awake and alert Pain management: pain level controlled Vital Signs Assessment: post-procedure vital signs reviewed and stable Respiratory status: spontaneous breathing, nonlabored ventilation and respiratory function stable Cardiovascular status: blood pressure returned to baseline and stable Postop Assessment: no apparent nausea or vomiting Anesthetic complications: no   No notable events documented.  Last Vitals:  Vitals:   09/11/20 1215 09/11/20 1230  BP: (!) 144/61 139/61  Pulse: 63 62  Resp: 15 14  Temp:  36.7 C  SpO2: 96% 95%    Last Pain:  Vitals:   09/11/20 1230  TempSrc:   PainSc: 0-No pain                 Lynda Rainwater

## 2020-09-11 NOTE — Discharge Instructions (Addendum)

## 2020-09-11 NOTE — Transfer of Care (Signed)
Immediate Anesthesia Transfer of Care Note  Patient: Angela [name]  Procedure(s) Performed: LEFT CARPAL TUNNEL RELEASE (Left: Hand)  Patient Location: PACU  Anesthesia Type:Bier block  Level of Consciousness: awake, alert , oriented and patient cooperative  Airway & Oxygen Therapy: Patient Spontanous Breathing and Patient connected to face mask oxygen  Post-op Assessment: Report given to RN and Post -op Vital signs reviewed and stable  Post vital signs: Reviewed and stable  Last Vitals:  Vitals Value Taken Time  BP    Temp    Pulse    Resp    SpO2      Last Pain:  Vitals:   09/11/20 1106  TempSrc: Oral  PainSc: 0-No pain      Patients Stated Pain Goal: 4 ([id number])  Complications: No notable events documented.

## 2020-09-11 NOTE — Brief Op Note (Signed)
09/11/2020  11:56 AM  PATIENT:  Angela [name]  80 y.o. female  PRE-OPERATIVE DIAGNOSIS:  LEFT CARPAL TUNNEL SYNDROME  POST-OPERATIVE DIAGNOSIS:  LEFT CARPAL TUNNEL SYNDROME  PROCEDURE:  Procedure(s) with comments: LEFT CARPAL TUNNEL RELEASE (Left) - 45 MIN  SURGEON:  Surgeon(s) and Role:    * Daryll Brod, MD - Primary  PHYSICIAN ASSISTANT:   ASSISTANTS: none   ANESTHESIA:   local, regional, and IV sedation  EBL:  22m  BLOOD ADMINISTERED:none  DRAINS: none   LOCAL MEDICATIONS USED:  BUPIVICAINE   SPECIMEN:  No Specimen  DISPOSITION OF SPECIMEN:  N/A  COUNTS:  YES  TOURNIQUET:   Total Tourniquet Time Documented: Upper Arm (Left) - 23 minutes Total: Upper Arm (Left) - 23 minutes   DICTATION: .DViviann SpareDictation  PLAN OF CARE: Discharge to home after PACU  PATIENT DISPOSITION:  PACU - hemodynamically stable.

## 2020-09-11 NOTE — H&P (Signed)
  Angela [name] is an 80 y.o. female.   Chief Complaint: numbness left hand HPI:  Angela [name] is a 80 year old female with a history of left distal radius fracture bilateral carpal tunnel syndrome. She has been working at therapy for improving range of motion and strength. He is now approximately 4 months following her fracture. Her fracture has healed with very minimal dorsal angulation. She continues to complain of numbness and tingling on her left side. She states her right side is much better following the carpal tunnel release on that side. She failed conservative treatment and does have positive nerve conductions she has a history of arthritis no history of diabetes thyroid problems or gout. Family history is positive for arthritis also nerve conductions are positive for carpal tunnel syndrome Past Medical History:  Diagnosis Date   Arthritis    Carpal tunnel syndrome on right 10/2006   Elevated cholesterol 05/1991   Histoplasmosis    Histoplasmosis    uses symbicort prn   Nephrolithiasis 2010   Osteoporosis    Polymyalgia ([place]) 02/1995    Past Surgical History:  Procedure Laterality Date   BLEPHAROPLASTY Bilateral 2/14   Dr. Isidoro Donning   CARPAL TUNNEL RELEASE Right 6/08   CERVICAL DISCECTOMY  12/91   JOINT REPLACEMENT     KNEE SURGERY Left    spots removed     frozen and biopsied-all benign   TOTAL HIP ARTHROPLASTY Left 05/06/2017   Procedure: TOTAL ANTERIOR HIP ARTHROPLASTY -LEFT;  Surgeon: Marybelle Killings, MD;  Location: [facility];  Service: Orthopedics;  Laterality: Left;    Family History  Problem Relation Age of Onset   Hypertension Mother    Thyroid disease Mother    Stroke Father    Social History:  reports that she has never smoked. She has never used smokeless tobacco. She reports current alcohol use. She reports that she does not use drugs.  Allergies:  Allergies  Allergen Reactions   Other Other (See Comments)   Statins Nausea Only    No medications prior to  admission.    No results found for this or any previous visit (from the past 48 hour(s)).  No results found.   Pertinent items are noted in HPI.  Height '5\' 6"'$  (1.676 m), weight 66.7 kg, last menstrual period 01/13/1993.  General appearance: alert, cooperative, and appears stated age Head: Normocephalic, without obvious abnormality Neck: no JVD Resp: clear to auscultation bilaterally Cardio: regular rate and rhythm, S1, S2 normal, no murmur, click, rub or gallop GI: soft, non-tender; bowel sounds normal; no masses,  no organomegaly Extremities: numbness left hand Pulses: 2+ and symmetric Skin: Skin color, texture, turgor normal. No rashes or lesions Neurologic: Grossly normal Incision/Wound: na  Assessment/Plan Assessment:  1. Closed Colles' fracture of left radius 2. Bilateral carpal tunnel syndrome    Plan: She would like to proceed to carpal tunnel release left side. Pre-. Postoperative course been discussed along with risk and complications. She is aware there is no guarantee to the surgery the possibility of infection recurrence injury to arteries nerves tendons complete relief symptoms and dystrophy. She is scheduled for left carpal tunnel release in outpatient under regional anesthesia.   Daryll Brod 09/11/2020, 10:11 AM

## 2020-09-11 NOTE — Op Note (Signed)
NAME: Angela [name] MEDICAL RECORD NO: [id number] DATE OF BIRTH: Dec 26, 1940 FACILITY: [facility] LOCATION: [facility] PHYSICIAN: Wynonia Sours, MD   OPERATIVE REPORT   DATE OF PROCEDURE: 09/11/20    PREOPERATIVE DIAGNOSIS: Carpal tunnel syndrome left hand   POSTOPERATIVE DIAGNOSIS: Same   PROCEDURE: Decompression median nerve left hand   SURGEON: Daryll Brod, M.D.   ASSISTANT: none   ANESTHESIA:  Bier block with sedation and Local   INTRAVENOUS FLUIDS:  Per anesthesia flow sheet.   ESTIMATED BLOOD LOSS:  Minimal.   COMPLICATIONS:  None.   SPECIMENS:  none   TOURNIQUET TIME:    Total Tourniquet Time Documented: Upper Arm (Left) - 23 minutes Total: Upper Arm (Left) - 23 minutes    DISPOSITION:  Stable to PACU.   INDICATIONS: Patient is a 80 year old female with a history of carpal tunnel syndrome nerve conductions positive.  She was originally scheduled for release many months ago but sustained a fracture of that wrist treated conservatively she is admitted now for carpal tunnel release following healing of the fracture.  Pre-.  Postoperative course been discussed along with risk complications.  She is aware that there is no guarantee to the surgery the possibility of infection recurrence injury to arteries nerves tendons incomplete relief of symptoms and dystrophy.  In preoperative area the patient is seen the extremity marked by both patient and surgeon antibiotic given  OPERATIVE COURSE: Patient is brought to the operating room where a forearm-based IV regional anesthetic was carried out without difficulty under the direction and anesthesia department after placing her supine position with left arm free.  Prep was done with ChloraPrep.  A 3-minute dry time was allowed and timeout taken confirm patient procedure.  A longitudinal incision was made left palm carried down through subcutaneous tissue.  Bleeders were electrocauterized bipolar.  Palmar fascia was  split.  The superficial palmar arch was identified along with the flexor tendon of the ring little finger.  Retractors were placed retracting median nerve flexor tendons radially and ulnar nerve ulnarly.  Deep structures were dissected free from the forearm fascia and retinaculum.  Flexor retinaculum was then released on its ulnar border.  A right angle and stool retractor placed between skin and forearm fashion the fascia was released for approximately 3 cm proximal to the wrist crease under direct vision.  The canal was explored.  Area compression of the nerve was apparent.  No further lesions were identified.  The wound was copious irrigated with saline.  The skin was closed interrupted 4-0 nylon sutures.  A local infiltration with quarter percent bupivacaine without epinephrine was given approximately 10 cc was used.  A sterile compressive dressing with the fingers free was applied.  Deflation of the tourniquet all fingers immediately pink.  She was taken to the recovery room for observation in satisfactory condition.  Will be discharged home to return to the hand center of [facility] in 1 week on Tylenol ibuprofen for pain with Ultram for breakthrough.   Daryll Brod, MD Electronically signed, 09/11/20

## 2020-09-11 NOTE — Anesthesia Procedure Notes (Signed)
Procedure Name: MAC Date/Time: 09/11/2020 11:38 AM Performed by: Signe Colt, CRNA Pre-anesthesia Checklist: Patient identified, Emergency Drugs available, Suction available, Patient being monitored and Timeout performed Patient Re-evaluated:Patient Re-evaluated prior to induction Oxygen Delivery Method: Simple face mask

## 2020-09-11 NOTE — Anesthesia Preprocedure Evaluation (Signed)
Anesthesia Evaluation  Patient identified by MRN, date of birth, ID band Patient awake    Reviewed: Allergy & Precautions, NPO status , Patient's Chart, lab work & pertinent test results  Airway Mallampati: II  TM Distance: >3 FB Neck ROM: Full    Dental no notable dental hx.    Pulmonary neg pulmonary ROS,    Pulmonary exam normal breath sounds clear to auscultation       Cardiovascular hypertension, + CAD  Normal cardiovascular exam Rhythm:Regular Rate:Normal     Neuro/Psych  Neuromuscular disease negative psych ROS   GI/Hepatic negative GI ROS, Neg liver ROS,   Endo/Other  negative endocrine ROS  Renal/GU Renal InsufficiencyRenal disease     Musculoskeletal  (+) Arthritis , Osteoarthritis,    Abdominal   Peds  Hematology negative hematology ROS (+)   Anesthesia Other Findings   Reproductive/Obstetrics negative OB ROS                             Anesthesia Physical  Anesthesia Plan  ASA: 3  Anesthesia Plan: Bier Block and Bier Block-LIDOCAINE ONLY   Post-op Pain Management:    Induction: Intravenous  PONV Risk Score and Plan: 2 and Ondansetron, Treatment may vary due to age or medical condition and Propofol infusion  Airway Management Planned: Simple Face Mask  Additional Equipment:   Intra-op Plan:   Post-operative Plan:   Informed Consent: I have reviewed the patients History and Physical, chart, labs and discussed the procedure including the risks, benefits and alternatives for the proposed anesthesia with the patient or authorized representative who has indicated his/her understanding and acceptance.     Dental advisory given  Plan Discussed with: CRNA  Anesthesia Plan Comments:         Anesthesia Quick Evaluation

## 2020-09-11 NOTE — Anesthesia Procedure Notes (Signed)
Anesthesia Regional Block: Bier block (IV Regional)   Pre-Anesthetic Checklist: , timeout performed,  Correct Patient, Correct Site, Correct Laterality,  Correct Procedure,, site marked,  Surgical consent,  At surgeon's request  Laterality: Left         Needles:  Injection technique: Single-shot  Needle Type: Other      Needle Gauge: 20     Additional Needles:   Procedures:,,,,, intact distal pulses, Esmarch exsanguination,  Single tourniquet utilized    Narrative:   Performed by: Personally      

## 2020-09-13 ENCOUNTER — Encounter (HOSPITAL_BASED_OUTPATIENT_CLINIC_OR_DEPARTMENT_OTHER): Payer: Self-pay | Admitting: Orthopedic Surgery

## 2020-09-21 ENCOUNTER — Ambulatory Visit: Payer: Medicare Other | Admitting: Orthopaedic Surgery

## 2020-10-02 ENCOUNTER — Ambulatory Visit (INDEPENDENT_AMBULATORY_CARE_PROVIDER_SITE_OTHER): Payer: Medicare Other | Admitting: Orthopaedic Surgery

## 2020-10-02 ENCOUNTER — Other Ambulatory Visit: Payer: Self-pay

## 2020-10-02 VITALS — BP 156/53 | Ht 66.0 in | Wt 146.0 lb

## 2020-10-02 DIAGNOSIS — I2584 Coronary atherosclerosis due to calcified coronary lesion: Secondary | ICD-10-CM | POA: Diagnosis not present

## 2020-10-02 DIAGNOSIS — I2510 Atherosclerotic heart disease of native coronary artery without angina pectoris: Secondary | ICD-10-CM

## 2020-10-02 DIAGNOSIS — M48062 Spinal stenosis, lumbar region with neurogenic claudication: Secondary | ICD-10-CM

## 2020-10-02 NOTE — Progress Notes (Signed)
Office Visit Note   Patient: Angela [name]           Date of Birth: 1940-01-16           MRN: [id number] Visit Date: 10/02/2020              Requested by: Sigmund Hazel, MD [id number] [place],  [place] [address] PCP: Sigmund Hazel, MD   Assessment & Plan: Visit Diagnoses:  1. Spinal stenosis of lumbar region with neurogenic claudication     Plan: We reviewed MRI report and images.  If she gets progression of her symptoms and states she like proceed with operative intervention she can return.  Recheck 6 months.  Follow-Up Instructions: Return in about 6 months (around 04/01/2021).   Orders:  No orders of the defined types were placed in this encounter.  No orders of the defined types were placed in this encounter.     Procedures: No procedures performed   Clinical Data: No additional findings.   Subjective: Chief Complaint  Patient presents with   Lower Back - Pain, Follow-up    HPI 80 year old female returns with ongoing problems with spinal stenosis with some claudication symptoms.  She still community ambulator uses a grocery cart to lean on.  She can ambulate a block sits and rest and can repeat.  Previous hip fracture with left total hip arthroplasty.  Said coronary disease hyperlipidemia.  Lumbar MRI February 2022 showed anterolisthesis L4-5 with mild stenosis and anterolisthesis 4 mm at L5-S1 with synovial cyst right and left neural foramina and bilateral L5 nerve compression.  She is occasionally using some Advil.  Review of Systems all the systems noncontributory to HPI.   Objective: Vital Signs: BP (!) 156/53   Ht 5\' 6"  (1.676 m)   Wt 146 lb (66.2 kg)   LMP 01/13/1993   BMI 23.57 kg/m   Physical Exam Constitutional:      Appearance: She is well-developed.  HENT:     Head: Normocephalic.     Right Ear: External ear normal.     Left Ear: External ear normal. There is no impacted cerumen.  Eyes:     Pupils: Pupils are equal, round, and  reactive to light.  Neck:     Thyroid: No thyromegaly.     Trachea: No tracheal deviation.  Cardiovascular:     Rate and Rhythm: Normal rate.  Pulmonary:     Effort: Pulmonary effort is normal.  Abdominal:     Palpations: Abdomen is soft.  Musculoskeletal:     Cervical back: No rigidity.  Skin:    General: Skin is warm and dry.  Neurological:     Mental Status: She is alert and oriented to person, place, and time.  Psychiatric:        Behavior: Behavior normal.    Ortho Exam negative logroll the hips.  Anterior tib gastrocsoleus is normal normal heel toe gait.  Distal pulses are palpable.  Specialty Comments:  No specialty comments available.  Imaging: No results found.   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) 07/10/2017  History of total hip arthroplasty, left 07/10/2017   Closed left hip fracture, initial encounter (HCC) 05/05/2017   Polymyalgia (HCC) 07/08/2013   Past Medical History:  Diagnosis Date   Arthritis    Carpal tunnel syndrome on right 10/2006   Elevated cholesterol 05/1991   Histoplasmosis    Histoplasmosis    uses symbicort prn   Nephrolithiasis 2010   Osteoporosis    Polymyalgia ([facility]CC) 02/1995    Family History  Problem Relation Age of Onset   Hypertension Mother    Thyroid disease Mother    Stroke Father     Past Surgical History:  Procedure Laterality Date   BLEPHAROPLASTY Bilateral 2/14   Dr. Shawna Orleans   CARPAL TUNNEL RELEASE Right 6/08   CARPAL TUNNEL RELEASE Left 09/11/2020    Procedure: LEFT CARPAL TUNNEL RELEASE;  Surgeon: Cindee Salt, MD;  Location: [facility];  Service: Orthopedics;  Laterality: Left;  45 MIN   CERVICAL DISCECTOMY  12/91   JOINT REPLACEMENT     KNEE SURGERY Left    spots removed     frozen and biopsied-all benign   TOTAL HIP ARTHROPLASTY Left 05/06/2017   Procedure: TOTAL ANTERIOR HIP ARTHROPLASTY -LEFT;  Surgeon: Eldred Manges, MD;  Location: [facility];  Service: Orthopedics;  Laterality: Left;   Social History   Occupational History   Not on file  Tobacco Use   Smoking status: Never   Smokeless tobacco: Never  Vaping Use   Vaping Use: Never used  Substance and Sexual Activity   Alcohol use: Yes    Comment: rare   Drug use: No   Sexual activity: Not Currently    Partners: Male    Birth control/protection: Other-see comments    Comment: spouse with vasectomy

## 2020-10-04 DIAGNOSIS — M810 Age-related osteoporosis without current pathological fracture: Secondary | ICD-10-CM | POA: Diagnosis not present

## 2020-11-07 DIAGNOSIS — Z23 Encounter for immunization: Secondary | ICD-10-CM | POA: Diagnosis not present

## 2020-11-08 DIAGNOSIS — E782 Mixed hyperlipidemia: Secondary | ICD-10-CM | POA: Diagnosis not present

## 2020-11-09 LAB — LIPID PANEL
Chol/HDL Ratio: 2.3 ratio (ref 0.0–4.4)
Cholesterol, Total: 183 mg/dL (ref 100–199)
HDL: 78 mg/dL (ref >39)
LDL Chol Calc (NIH): 89 mg/dL (ref 0–99)
Triglycerides: 86 mg/dL (ref 0–149)
VLDL Cholesterol Cal: 16 mg/dL (ref 5–40)

## 2020-11-15 ENCOUNTER — Ambulatory Visit: Payer: Medicare Other | Admitting: Cardiology

## 2020-11-15 ENCOUNTER — Encounter: Payer: Self-pay | Admitting: Cardiology

## 2020-11-15 ENCOUNTER — Other Ambulatory Visit: Payer: Self-pay

## 2020-11-15 VITALS — BP 152/66 | HR 59 | Temp 97.5°F | Resp 18 | Ht 66.0 in | Wt 148.2 lb

## 2020-11-15 DIAGNOSIS — E782 Mixed hyperlipidemia: Secondary | ICD-10-CM

## 2020-11-15 DIAGNOSIS — I2584 Coronary atherosclerosis due to calcified coronary lesion: Secondary | ICD-10-CM | POA: Diagnosis not present

## 2020-11-15 DIAGNOSIS — I10 Essential (primary) hypertension: Secondary | ICD-10-CM | POA: Diagnosis not present

## 2020-11-15 DIAGNOSIS — I2510 Atherosclerotic heart disease of native coronary artery without angina pectoris: Secondary | ICD-10-CM | POA: Diagnosis not present

## 2020-11-15 NOTE — Progress Notes (Signed)
Follow up visit  Subjective:   Angela [name], female    DOB: [birth year], 80 y.o.   MRN: [id number]   HPI   Chief Complaint  Patient presents with   Hyperlipidemia          80 y.o. Caucasian female with hypertension, hyperlipidemia, coronary atherosclerosis, [name]  Patient is tolerating rosuvastatin 10 mg every other day fairly well. Lipid panel results discussed with the patient, details below. .   Current Outpatient Medications on File Prior to Visit  Medication Sig Dispense Refill   Cholecalciferol (VITAMIN D-3 PO) Take 1 capsule by mouth daily.     denosumab (PROLIA) 60 MG/ML SOSY injection Inject 60 mg into the skin every 6 (six) months.     Multiple Vitamins-Minerals (MULTIVITAMIN PO) Take 1 tablet by mouth daily.      Omega-3 Fatty Acids (FISH OIL PO) Take 1 capsule by mouth daily.      rosuvastatin (CRESTOR) 10 MG tablet Take 2 tablets (20 mg total) by mouth every other day. 60 tablet 3   SYMBICORT 160-4.5 MCG/ACT inhaler Inhale 2 puffs into the lungs 2 (two) times daily.     traMADol (ULTRAM) 50 MG tablet Take 1 tablet (50 mg total) by mouth every 6 (six) hours as needed. (Patient not taking: Reported on 10/02/2020) 20 tablet 0   No current facility-administered medications on file prior to visit.    Cardiovascular & other pertient studies:  EKG 07/18/2020: Sinus rhythm 59 bpm  Echocardiogram 05/02/2019:  Normal LV systolic function with visual EF 60-65%. Left ventricle cavity is normal in size. Normal global wall motion.  Normal diastolic filling pattern, normal LAP. Calculated EF 61%.  Mild (Grade I) aortic regurgitation.  Mild (Grade I) mitral regurgitation.  No prior study for comparison.  Lexiscan myoview stress test 10/12/2017: 1. The resting electrocardiogram demonstrated normal sinus rhythm, normal resting conduction, no resting arrhythmias and normal rest repolarization. Stress EKG is nondiagnostic for ischemia as a pharmacologic stress test. 2.  Myocardial perfusion imaging is normal. Overall left ventricular systolic function was normal without regional wall motion abnormalities. The left ventricular ejection fraction was 82%. 3. Low risk study.   CT Chest 09/25/2017: 1. Small nonspecific pulmonary nodules within the RIGHT lung, both measuring 4 mm with 1 likely corresponding to the recent chest x-ray finding, as well as chronic appearing nodular scarring/atelectasis along the pleura of the RIGHT upper lobe.  Recommend follow-up noncontrast chest CT in 12 months to ensure stability of all findings. 2. Coronary artery calcifications, particularly dense within the LEFT main and LEFT anterior descending coronary arteries.  Recommend correlation with any possible associated cardiac symptoms.  Recent labs: 11/08/2020: Chol 183, TG 86, HDL 78, LDL 89  07/11/2020: Chol 191, TG 96, HDL 83, LDL 91  12/30/2019: Chol 184, TG 80, HDL 80, LDL 89  08/31/2019: Glucose 81, BUN/Cr 15/0.7. EGFR 85 Chol 265, TG 140, HDL 75, LDL 165   Lipid panel 11/01/2018: Cholesterol 169, triglycerides 83, HDL 79, LDL 75.  09/30/2017: Glucose 85. BUN/creatinine 12/0.67.  EGFR 86/104.  Sodium 140, potassium 4.7.  Rest of the CMP normal. Cholesterol 230, triglycerides 52, HDL 73, LDL 146    Review of Systems  Cardiovascular:  Negative for chest pain, dyspnea on exertion, leg swelling, palpitations and syncope.  Gastrointestinal:  Positive for nausea (With simvastatin, resolved after stopping it).   No change in exam compared to last visit by me      There were no vitals filed for this  Follow up visit  Subjective:   Angela [name], female    DOB: [birth year], 80 y.o.   MRN: [id number]   HPI   Chief Complaint  Patient presents with   Hyperlipidemia          80 y.o. Caucasian female with hypertension, hyperlipidemia, coronary atherosclerosis, [name]  Patient is tolerating rosuvastatin 10 mg every other day fairly well. Lipid panel results discussed with the patient, details below. .   Current Outpatient Medications on File Prior to Visit  Medication Sig Dispense Refill   Cholecalciferol (VITAMIN D-3 PO) Take 1 capsule by mouth daily.     denosumab (PROLIA) 60 MG/ML SOSY injection Inject 60 mg into the skin every 6 (six) months.     Multiple Vitamins-Minerals (MULTIVITAMIN PO) Take 1 tablet by mouth daily.      Omega-3 Fatty Acids (FISH OIL PO) Take 1 capsule by mouth daily.      rosuvastatin (CRESTOR) 10 MG tablet Take 2 tablets (20 mg total) by mouth every other day. 60 tablet 3   SYMBICORT 160-4.5 MCG/ACT inhaler Inhale 2 puffs into the lungs 2 (two) times daily.     traMADol (ULTRAM) 50 MG tablet Take 1 tablet (50 mg total) by mouth every 6 (six) hours as needed. (Patient not taking: Reported on 10/02/2020) 20 tablet 0   No current facility-administered medications on file prior to visit.    Cardiovascular & other pertient studies:  EKG 07/18/2020: Sinus rhythm 59 bpm  Echocardiogram 05/02/2019:  Normal LV systolic function with visual EF 60-65%. Left ventricle cavity is normal in size. Normal global wall motion.  Normal diastolic filling pattern, normal LAP. Calculated EF 61%.  Mild (Grade I) aortic regurgitation.  Mild (Grade I) mitral regurgitation.  No prior study for comparison.  Lexiscan myoview stress test 10/12/2017: 1. The resting electrocardiogram demonstrated normal sinus rhythm, normal resting conduction, no resting arrhythmias and normal rest repolarization. Stress EKG is nondiagnostic for ischemia as a pharmacologic stress test. 2.  Myocardial perfusion imaging is normal. Overall left ventricular systolic function was normal without regional wall motion abnormalities. The left ventricular ejection fraction was 82%. 3. Low risk study.   CT Chest 09/25/2017: 1. Small nonspecific pulmonary nodules within the RIGHT lung, both measuring 4 mm with 1 likely corresponding to the recent chest x-ray finding, as well as chronic appearing nodular scarring/atelectasis along the pleura of the RIGHT upper lobe.  Recommend follow-up noncontrast chest CT in 12 months to ensure stability of all findings. 2. Coronary artery calcifications, particularly dense within the LEFT main and LEFT anterior descending coronary arteries.  Recommend correlation with any possible associated cardiac symptoms.  Recent labs: 11/08/2020: Chol 183, TG 86, HDL 78, LDL 89  07/11/2020: Chol 191, TG 96, HDL 83, LDL 91  12/30/2019: Chol 184, TG 80, HDL 80, LDL 89  08/31/2019: Glucose 81, BUN/Cr 15/0.7. EGFR 85 Chol 265, TG 140, HDL 75, LDL 165   Lipid panel 11/01/2018: Cholesterol 169, triglycerides 83, HDL 79, LDL 75.  09/30/2017: Glucose 85. BUN/creatinine 12/0.67.  EGFR 86/104.  Sodium 140, potassium 4.7.  Rest of the CMP normal. Cholesterol 230, triglycerides 52, HDL 73, LDL 146    Review of Systems  Cardiovascular:  Negative for chest pain, dyspnea on exertion, leg swelling, palpitations and syncope.  Gastrointestinal:  Positive for nausea (With simvastatin, resolved after stopping it).   No change in exam compared to last visit by me      There were no vitals filed for this

## 2020-11-27 DIAGNOSIS — L719 Rosacea, unspecified: Secondary | ICD-10-CM | POA: Diagnosis not present

## 2020-12-07 ENCOUNTER — Encounter: Payer: Self-pay | Admitting: Student

## 2020-12-09 ENCOUNTER — Telehealth: Payer: Self-pay | Admitting: Student

## 2020-12-09 DIAGNOSIS — I10 Essential (primary) hypertension: Secondary | ICD-10-CM

## 2020-12-09 NOTE — Telephone Encounter (Signed)
ON-CALL CARDIOLOGY 12/07/2020  Patient's name: Angela [name].   MRN: [id number].    DOB: 02/24/1940 Primary care provider: Kathyrn Lass, MD. Primary cardiologist: Dr. Jorge Ny regarding this patient's care today: Patient called concerned that her blood pressure is elevated at 184/72 mmHg. Patient admits to high sodium intake over the last 2-3 days. Denies symptoms of TIA or CVA. Patient is asymptomatic. She has taken 25 mg of hydralazine which was previously prescribed for as needed use.   Impression:   ICD-10-CM   1. Essential hypertension  I10       No orders of the defined types were placed in this encounter.   No orders of the defined types were placed in this encounter.   Recommendations: Advised patient to take an additional 25 mg of hydralazine and monitoring blood pressure. Patient will notify our office if blood pressure fails to improve. Counseled patient regarding signs and symptoms that would warrant urgent or emergent evaluation, patient verbalized understanding and agreement.   Telephone encounter total time: 7 minutes     Alethia Berthold, PA-C 12/09/2020, 8:11 PM Office: [phone]

## 2020-12-14 DIAGNOSIS — Z6823 Body mass index (BMI) 23.0-23.9, adult: Secondary | ICD-10-CM | POA: Diagnosis not present

## 2020-12-14 DIAGNOSIS — M353 Polymyalgia rheumatica: Secondary | ICD-10-CM | POA: Diagnosis not present

## 2020-12-14 DIAGNOSIS — M8000XD Age-related osteoporosis with current pathological fracture, unspecified site, subsequent encounter for fracture with routine healing: Secondary | ICD-10-CM | POA: Diagnosis not present

## 2020-12-21 ENCOUNTER — Telehealth: Payer: Self-pay | Admitting: Pharmacist

## 2020-12-21 NOTE — Telephone Encounter (Signed)
Pt called stating that she had been having SBP >140s over the past week while taking PRN hydralazine 25 mg. Denies any recent salt intake that would contribute to the elevated BP concerns. Pt reports that she took her last hydralazine dose and wanted to see if she should get it refilled or not. Reviewed that pt was previously recommended to start scheduled amlodipine to help manage her intermittent BP spikes. Pt never started on it, but has amlodipine 5 mg at home. Pt agreeable to starting amlodipine 2.5 mg Qday to see if it helps with her labile BP readings. Pt reports that since starting amlodipine 2.5 mg, her home BP readings have improved significantly. Over the past 4 days, her BP stabilized with SBP ranging from 116-136. Pt reports that she feels significantly better on amlodipine 2.5 mg daily. Pt denies any complains of lightheadedness, dizziness, syncope, or falls. Denies any complains of lower extremity swelling concerns. Pt agreeable to continue current amlodipine 2.5 mg daily and continue monitoring home BP readings.

## 2021-02-03 ENCOUNTER — Other Ambulatory Visit: Payer: Self-pay

## 2021-02-03 ENCOUNTER — Encounter (HOSPITAL_BASED_OUTPATIENT_CLINIC_OR_DEPARTMENT_OTHER): Payer: Self-pay | Admitting: Obstetrics and Gynecology

## 2021-02-03 ENCOUNTER — Emergency Department (HOSPITAL_BASED_OUTPATIENT_CLINIC_OR_DEPARTMENT_OTHER)
Admission: EM | Admit: 2021-02-03 | Discharge: 2021-02-03 | Disposition: A | Discharge status: Home / Self Care | Payer: Medicare Other | Attending: Emergency Medicine | Admitting: Emergency Medicine

## 2021-02-03 DIAGNOSIS — M545 Low back pain, unspecified: Secondary | ICD-10-CM | POA: Insufficient documentation

## 2021-02-03 DIAGNOSIS — I10 Essential (primary) hypertension: Secondary | ICD-10-CM | POA: Diagnosis not present

## 2021-02-03 DIAGNOSIS — Z79899 Other long term (current) drug therapy: Secondary | ICD-10-CM | POA: Diagnosis not present

## 2021-02-03 HISTORY — DX: Essential (primary) hypertension: I10

## 2021-02-03 MED ORDER — AMLODIPINE BESYLATE 5 MG PO TABS: 5.0000 mg | ORAL_TABLET | Freq: Every day | ORAL | 0 refills | Status: DC

## 2021-02-03 NOTE — Discharge Instructions (Signed)
You may take 2.5mg  of amlodipine tonight, and initiate 5mg  tomorrow morning and discuss with Dr. Virgina Jock. It was a pleasure caring for you!  Return to the ED if you develop chest pain, shortness of breath, numbness/weakness, dizziness, or other concerns.

## 2021-02-03 NOTE — ED Provider Notes (Signed)
[facility]ERGENC[facility] DEPT Provider Note   CSN: [id number] Arrival date & time: 02/03/21  2100     History  Chief Complaint  Patient presents with   Hypertension    Angela [name] is a 81 y.o. female.  HPI     81yo female with history of hypertension presents with concern for elevated blood pressures.   BP 170-188 up to 196 Took 5mg  amlodipine   Usually in 130s, sees Dr. Virgina Jock, monitor BP on 2.5mg  amlodipine BID  This afternoon started to feel like blood pressure was high, just feels "yucky" doesn't feel right, how she can tell her BP up higher. Feels anxious.  No headache, no chest pain, shortness of breath, abdominal pain, nausea or vomiting, Denies numbness, weakness, difficulty talking or walking, visual changes or facial droop.  Had back pain, not unusual, low back pain, didn't take advil today, just the voltaren gel   Put some voltaren on back, and thinks it may have been the cause L4 L5 sees Dr. 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 Authorizing Provider  amLODipine (NORVASC) 5 MG tablet Take 1 tablet (5 mg total) by mouth daily. 02/03/21 03/05/21  Gareth Morgan, MD  Cholecalciferol (VITAMIN D-3 PO) Take 1 capsule by mouth daily.    [provider]  denosumab (PROLIA) 60 MG/ML SOSY injection Inject 60 mg into the skin every 6 (six) months.    [provider]  Multiple Vitamins-Minerals (MULTIVITAMIN PO) Take 1 tablet by mouth daily.     [provider]  Omega-3 Fatty Acids (FISH OIL PO) Take  1 capsule by mouth daily.     [provider]  rosuvastatin (CRESTOR) 10 MG tablet Take 2 tablets (20 mg total) by mouth every other day. 08/15/20 11/15/20  Patwardhan, Reynold Bowen, MD  SYMBICORT 160-4.5 MCG/ACT inhaler Inhale 2 puffs into the lungs 2 (two) times daily. 06/16/18   [provider]  traMADol (ULTRAM) 50 MG tablet Take 1 tablet (50 mg total) by mouth every 6 (six) hours as needed. 09/11/20   Daryll Brod, MD      Allergies    Other and Statins    Review of Systems   Review of Systems  Physical Exam Updated Vital Signs BP (!) 169/60    Pulse 69    Temp 98.1 F (36.7 C)    Resp 16    Ht 5\' 6"  (1.676 m)    Wt 67.2 kg    LMP 01/13/1993    SpO2 98%    BMI 23.91 kg/m  Physical Exam Vitals and nursing note reviewed.  Constitutional:      General: She is not in acute distress.    Appearance: Normal appearance. She is well-developed. She is not ill-appearing or diaphoretic.  HENT:     Head: Normocephalic and atraumatic.  Eyes:     General: No visual field deficit.    Extraocular Movements: Extraocular movements intact.     Conjunctiva/sclera: Conjunctivae normal.     Pupils: Pupils are equal, round,  and reactive to light.  Cardiovascular:     Rate and Rhythm: Normal rate and regular rhythm.     Pulses: Normal pulses.     Heart sounds: Normal heart sounds. No murmur heard.   No friction rub. No gallop.  Pulmonary:     Effort: Pulmonary effort is normal. No respiratory distress.     Breath sounds: Normal breath sounds. No wheezing or rales.  Abdominal:     General: There is no distension.     Palpations: Abdomen is soft.     Tenderness: There is no abdominal tenderness. There is no guarding.  Musculoskeletal:        General: No swelling or tenderness.     Cervical back: Normal range of motion.  Skin:    General: Skin is warm and dry.     Findings: No erythema or rash.  Neurological:     General: No focal deficit present.     Mental Status: She is alert and  oriented to person, place, and time.     GCS: GCS eye subscore is 4. GCS verbal subscore is 5. GCS motor subscore is 6.     Cranial Nerves: No cranial nerve deficit, dysarthria or facial asymmetry.     Sensory: No sensory deficit.     Motor: No weakness or tremor.     Coordination: Coordination normal. Finger-Nose-Finger Test normal.     Gait: Gait normal.    ED Results / Procedures / Treatments   Labs (all labs ordered are listed, but only abnormal results are displayed) Labs Reviewed - No data to display  EKG None  Radiology No results found.  Procedures Procedures    Medications Ordered in ED Medications - No data to display  ED Course/ Medical Decision Making/ A&P                            Very pleasant 81yo female with history of hypertension presents with concern for elevated blood pressures. She is without headache, no neurologic symptoms, no chest pain, no shortness of breath and have low suspicion for hypertensive emergencies including low suspicion for [facility], hypertensive encephalopathy, stroke, MI, aortic dissection, pulmonary edema.  Possible blood pressures elevated related to extra caffeine, voltaren gel and or pain.  BP 160s-170s in the ED, given values at home and in ED do feel she would benefit from additional antihypertensive control.  Recommend 2.5 mg tonight and increasing dose tomorrow to 5mg  daily. Dr. Virgina Jock has been managing blood pressures and recommend discussion with him regarding changes.  Patient discharged in stable condition with understanding of reasons to return.            Final Clinical Impression(s) / ED Diagnoses Final diagnoses:  Hypertension, unspecified type    Rx / DC Orders ED Discharge Orders          Ordered    amLODipine (NORVASC) 5 MG tablet  Daily        02/03/21 2246              Gareth Morgan, MD 02/05/21 [id number]

## 2021-02-03 NOTE — ED Triage Notes (Signed)
Patient reports to the ER for HTN. Reports she has been having some HTN episodes 170's-190's/80's-90's. Patient reports she was having some sensations that prompted her to check her BP at home.  Reports she normally takes 2.5mg  amlodipine for her BP but took 5mg  today

## 2021-02-11 ENCOUNTER — Telehealth: Payer: Self-pay | Admitting: Pharmacist

## 2021-02-11 NOTE — Telephone Encounter (Signed)
Or 10 daily. Either okay with me.  Thanks MJP

## 2021-02-11 NOTE — Telephone Encounter (Signed)
Pt called stating that she recently was recommended to increase her amlodipine dose from 2.5 mg to 5 mg following recent ER visit for elevated BP.  Pt reports that her BP readings at home have still remained labile still, but improving overall. Pt noted to have intermittent spikes in her BP, mostly in the evening. Pt reports that evening elevations might be related to her anxiety due to personal stressors. Pt reports that she has been working on managing her anxiety through deep breathing. Evening BP more elevated than AM readings. Pt currently taking amlodipine 5 mg in the morning. Will consider increasing amlodipine dose to 5 mg BID to help manage pt's HTN concerns.

## 2021-02-18 ENCOUNTER — Other Ambulatory Visit: Payer: Self-pay | Admitting: Pharmacist

## 2021-02-18 DIAGNOSIS — I10 Essential (primary) hypertension: Secondary | ICD-10-CM

## 2021-02-18 MED ORDER — AMLODIPINE BESYLATE 5 MG PO TABS: 5.0000 mg | ORAL_TABLET | Freq: Two times a day (BID) | ORAL | 3 refills | Status: DC

## 2021-04-02 ENCOUNTER — Other Ambulatory Visit: Payer: Self-pay

## 2021-04-02 ENCOUNTER — Encounter: Payer: Self-pay | Admitting: Orthopaedic Surgery

## 2021-04-02 ENCOUNTER — Ambulatory Visit (INDEPENDENT_AMBULATORY_CARE_PROVIDER_SITE_OTHER): Payer: Medicare Other | Admitting: Orthopaedic Surgery

## 2021-04-02 VITALS — BP 136/67 | Ht 66.0 in | Wt 140.0 lb

## 2021-04-02 DIAGNOSIS — M48062 Spinal stenosis, lumbar region with neurogenic claudication: Secondary | ICD-10-CM

## 2021-04-02 DIAGNOSIS — Z96642 Presence of left artificial hip joint: Secondary | ICD-10-CM | POA: Diagnosis not present

## 2021-04-02 NOTE — Progress Notes (Signed)
? ?Office Visit Note ?  ?Patient: Angela [name]           ?Date of Birth: 07/30/1940           ?MRN: [id number] ?Visit Date: 04/02/2021 ?             ?Requested by: Sigmund Hazel, MD ?[address] ?[place],  [place] [address] ?PCP: Sigmund Hazel, MD ? ? ?Assessment & Plan: ?Visit Diagnoses:  ?1. Spinal stenosis of lumbar region with neurogenic claudication   ?2. History of total hip arthroplasty, left   ? ? ?Plan: Patient return if she has progression of her neurogenic claudication symptoms with lumbar spinal stenosis.  She understands two-level instrumented fusion would be required where she has anterolisthesis and facet cysts.  Currently she is amatory in the community and tolerates her claudication symptoms reasonably well.  She will return if she gets increased symptoms.  She remains on Prolia, vitamin D and calcium for osteoporosis. ? ?Follow-Up Instructions: No follow-ups on file.  ? ?Orders:  ?No orders of the defined types were placed in this encounter. ? ?No orders of the defined types were placed in this encounter. ? ? ? ? Procedures: ?No procedures performed ? ? ?Clinical Data: ?No additional findings. ? ? ?Subjective: ?Chief Complaint  ?Patient presents with  ? Lower Back - Follow-up  ? ? 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  She has claudication symptoms and can ambulate a block then sits and rest.  Sunday she states she can walk a little bit further.  She does well in the grocery store as long she takes the cart.  Previous total hip arthroplasty for femoral neck fracture 2019 press-fit/02/16/17 with subsidence of the femoral stem with collar down to the calcar.  She walks well and just uses a lift in her left shoe.  She notices her leg lengths are uneven if she is barefooted.  She states claudication symptoms give her some problems but she has adapted and is not sure she wants to consider lumbar spine  surgery fusion at this point. ? ?Review of Systems all other systems updated noncontributory. ? ? ?Objective: ?Vital Signs: BP 136/67   Ht 5\' 6"  (1.676 m)   Wt 140 lb (63.5 kg)   LMP 01/13/1993   BMI 22.60 kg/m?  ? ?Physical Exam ?Constitutional:   ?   Appearance: She is well-developed.  ?HENT:  ?   Head: Normocephalic.  ?   Right Ear: External ear normal.  ?   Left Ear: External ear normal. There is no impacted cerumen.  ?Eyes:  ?   Pupils: Pupils are equal, round, and reactive to light.  ?Neck:  ?   Thyroid: No thyromegaly.  ?   Trachea: No tracheal deviation.  ?Cardiovascular:  ?   Rate and Rhythm: Normal rate.  ?Pulmonary:  ?   Effort: Pulmonary effort is normal.  ?Abdominal:  ?   Palpations: Abdomen is soft.  ?Musculoskeletal:  ?   Cervical back: No rigidity.  ?Skin: ?   General: Skin is warm and dry.  ?Neurological:  ?   Mental Status: She is alert and oriented to person, place, and time.  ?Psychiatric:     ?   Behavior: Behavior normal.  ? ? ?Ortho Exam patient is amatory without limp.  Negative logroll.  Left slight shortening, hip is stable.  Anterior tib gastrocsoleus is strong and active.  Well-healed  carpal tunnel incision left from surgery by Dr. Merlyn Lot. ? ?Specialty Comments:  ?No specialty comments available. ? ?Imaging: ?No results found. ? ? ?PMFS History: ?Patient Active Problem List  ? Diagnosis Date Noted  ? Essential hypertension 01/18/2020  ? Spinal stenosis of lumbar region 01/17/2020  ? Mixed hyperlipidemia 09/15/2019  ? Statin intolerance 09/15/2019  ? Coronary artery disease involving native coronary artery of native heart without angina pectoris 10/25/2018  ? Elevated blood pressure reading without diagnosis of hypertension 10/25/2018  ? Sudden left hearing loss 07/30/2018  ? 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 History of total hip arthroplasty, left 07/10/2017  ? Closed left hip fracture, initial encounter (HCC) 05/05/2017  ? Polymyalgia (HCC) 07/08/2013   ? ?Past Medical History:  ?Diagnosis Date  ? Arthritis   ? Carpal tunnel syndrome on right 10/2006  ? Elevated cholesterol 05/1991  ? Histoplasmosis   ? Histoplasmosis   ? uses symbicort prn  ? Hypertension   ? Nephrolithiasis 2010  ? Osteoporosis   ? Polymyalgia (HCC) 02/1995  ?  ?Family History  ?Problem Relation Age of Onset  ? Hypertension Mother   ? Thyroid disease Mother   ? Stroke Father   ?  ?Past Surgical History:  ?Procedure Laterality Date  ? BLEPHAROPLASTY Bilateral 2/14  ? Dr. Shawna Orleans  ? CARPAL TUNNEL RELEASE Right 6/08  ? CARPAL TUNNEL RELEASE Left 09/11/2020  ? Procedure: LEFT CARPAL TUNNEL RELEASE;  Surgeon: Cindee Salt, MD;  Location: [facility];  Service: Orthopedics;  Laterality: Left;  45 MIN  ? CERVICAL DISCECTOMY  12/91  ? JOINT REPLACEMENT    ? KNEE SURGERY Left   ? spots removed    ? frozen and biopsied-all benign  ? TOTAL HIP ARTHROPLASTY Left 05/06/2017  ? Procedure: TOTAL ANTERIOR HIP ARTHROPLASTY -LEFT;  Surgeon: Eldred Manges, MD;  Location: [facility] OR;  Service: Orthopedics;  Laterality: Left;  ? ?Social History  ? ?Occupational History  ? Not on file  ?Tobacco Use  ? Smoking status: Never  ? Smokeless tobacco: Never  ?Vaping Use  ? Vaping Use: Never used  ?Substance and Sexual Activity  ? Alcohol use: Yes  ?  Comment: rare  ? Drug use: No  ? Sexual activity: Not Currently  ?  Partners: Male  ?  Birth control/protection: Other-see comments  ?  Comment: spouse with vasectomy  ? ? ? ? ? ? ?

## 2021-04-04 DIAGNOSIS — M810 Age-related osteoporosis without current pathological fracture: Secondary | ICD-10-CM | POA: Diagnosis not present

## 2021-05-07 DIAGNOSIS — E782 Mixed hyperlipidemia: Secondary | ICD-10-CM | POA: Diagnosis not present

## 2021-05-08 LAB — LIPID PANEL
Chol/HDL Ratio: 2.4 ratio (ref 0.0–4.4)
Cholesterol, Total: 176 mg/dL (ref 100–199)
HDL: 72 mg/dL (ref >39)
LDL Chol Calc (NIH): 94 mg/dL (ref 0–99)
Triglycerides: 48 mg/dL (ref 0–149)
VLDL Cholesterol Cal: 10 mg/dL (ref 5–40)

## 2021-05-14 ENCOUNTER — Encounter: Payer: Self-pay | Admitting: Cardiology

## 2021-05-14 ENCOUNTER — Ambulatory Visit: Payer: Medicare Other | Admitting: Cardiology

## 2021-05-14 VITALS — BP 127/63 | HR 80 | Temp 97.0°F | Resp 16 | Ht 66.0 in | Wt 149.0 lb

## 2021-05-14 DIAGNOSIS — I10 Essential (primary) hypertension: Secondary | ICD-10-CM | POA: Diagnosis not present

## 2021-05-14 DIAGNOSIS — E782 Mixed hyperlipidemia: Secondary | ICD-10-CM

## 2021-05-14 NOTE — Progress Notes (Signed)
? ?Follow up visit ? ?Subjective:  ? ?Angela [name], female    DOB: 17-Oct-1940, 81 y.o.   MRN: [id number] ? ? ?HPI ? ? ?Chief Complaint  ?Patient presents with  ? Coronary Artery Disease  ? Hypertension  ? Follow-up  ?  6 month  ? ? ? ?81 y.o. Caucasian female with hypertension, hyperlipidemia, coronary atherosclerosis, [name] ? ?Patient is tolerating rosuvastatin 10 mg every other day fairly well. Lipid panel results discussed with the patient, details below. . ? ? ?Current Outpatient Medications:  ?  amLODipine (NORVASC) 5 MG tablet, Take 1 tablet (5 mg total) by mouth 2 (two) times daily., Disp: 180 tablet, Rfl: 3 ?  Cholecalciferol (VITAMIN D-3 PO), Take 1 capsule by mouth daily., Disp: , Rfl:  ?  denosumab (PROLIA) 60 MG/ML SOSY injection, Inject 60 mg into the skin every 6 (six) months., Disp: , Rfl:  ?  Multiple Vitamins-Minerals (MULTIVITAMIN PO), Take 1 tablet by mouth daily. , Disp: , Rfl:  ?  Omega-3 Fatty Acids (FISH OIL PO), Take 1 capsule by mouth daily. , Disp: , Rfl:  ?  rosuvastatin (CRESTOR) 10 MG tablet, Take 2 tablets (20 mg total) by mouth every other day., Disp: 60 tablet, Rfl: 3 ?  SYMBICORT 160-4.5 MCG/ACT inhaler, Inhale 2 puffs into the lungs 2 (two) times daily., Disp: , Rfl:  ?  traMADol (ULTRAM) 50 MG tablet, Take 1 tablet (50 mg total) by mouth every 6 (six) hours as needed. (Patient not taking: Reported on 04/02/2021), Disp: 20 tablet, Rfl: 0 ? ?Cardiovascular & other pertient studies: ? ?EKG 05/14/2021: ?Sinus rhythm 62 bpm ?Normal EKG ? ?Echocardiogram 05/02/2019:  ?Normal LV systolic function with visual EF 60-65%. Left ventricle cavity is normal in size. Normal global wall motion.  ?Normal diastolic filling pattern, normal LAP. Calculated EF 61%.  ?Mild (Grade I) aortic regurgitation.  ?Mild (Grade I) mitral regurgitation.  ?No prior study for comparison. ? ?Lexiscan myoview stress test 10/12/2017: ?1. The resting electrocardiogram demonstrated normal sinus rhythm, normal resting  conduction, no resting arrhythmias and normal rest repolarization. Stress EKG is nondiagnostic for ischemia as a pharmacologic stress test. ?2. Myocardial perfusion imaging is normal. Overall left ventricular systolic function was normal without regional wall motion abnormalities. The left ventricular ejection fraction was 82%. ?3. Low risk study.  ? ?CT Chest 09/25/2017: ?1. Small nonspecific pulmonary nodules within the RIGHT lung, both measuring 4 mm with 1 likely corresponding to the recent chest x-ray ?finding, as well as chronic appearing nodular scarring/atelectasis along the pleura of the RIGHT upper lobe.  ?Recommend follow-up noncontrast chest CT in 12 months to ensure stability of all findings. ?2. Coronary artery calcifications, particularly dense within the LEFT main and LEFT anterior descending coronary arteries.  ?Recommend correlation with any possible associated cardiac symptoms. ? ?Recent labs: ?05/07/2021: ?Chol 176, TG 48, HDL 72, LDL 94 ? ?11/08/2020: ?Chol 183, TG 86, HDL 78, LDL 89 ? ?08/31/2019: ?Glucose 81, BUN/Cr 15/0.7. EGFR 85 ?Chol 265, TG 140, HDL 75, LDL 165 ? ? ?Review of Systems  ?Cardiovascular:  Negative for chest pain, dyspnea on exertion, leg swelling, palpitations and syncope.  ?Gastrointestinal:  Positive for nausea (With simvastatin, resolved after stopping it).  ? ?No change in exam compared to last visit by me ?   ? ? ?Vitals:  ? 05/14/21 1439  ?BP: 127/63  ?Pulse: 80  ?Resp: 16  ?Temp: (!) 97 ?F (36.1 ?C)  ?SpO2: 95%  ? ? ? ?Body mass index is 24.05  kg/m?Marland Kitchen Ceasar Mons Weights  ? 05/14/21 1439  ?Weight: 149 lb (67.6 kg)  ? ? ? ?Objective:  ? Physical Exam ?Vitals and nursing note reviewed.  ?Constitutional:   ?   General: She is not in acute distress. ?Neck:  ?   Vascular: No JVD.  ?Cardiovascular:  ?   Rate and Rhythm: Normal rate and regular rhythm.  ?   Heart sounds: Normal heart sounds. No murmur heard. ?Pulmonary:  ?   Effort: Pulmonary effort is normal.  ?   Breath sounds:  Normal breath sounds. No wheezing or rales.  ?Musculoskeletal:  ?   Right lower leg: No edema.  ?   Left lower leg: No edema.  ? ? ? ?   ?Assessment & Recommendations:  ? ?81 y.o. Caucasian female with hypertension, hyperlipidemia, coronary atherosclerosis, [name] ? ?Coronary artery calcification: ?No angina symptoms. Nuclear stress test with no iscehmia, infarction (09/2017). ?Continue risk factor modification. ?Okay not to be on Aspirin in absence of obstructive CAD ?Statin intolerance to several statins, but able to tolerate rosuvastatin 10 mg every other day. Lipids fairly well controlled. Encouraged her to increase rosuvastatin to every day. ?In the past, PCSK9 inhibitor was not approved by insurance.  ?Reasonable to continue current conservative approach, as above.  ? ?White coat hypertension: ?Continue home monitoring for now. ? ?Mild AI, MR: ?Clinically asymptomatic ? ?F/u in 1 year ? ? ?[name], MD ?[facility] ?Pager: [phone] ?Office: [phone] ?  ? ?

## 2021-05-15 ENCOUNTER — Ambulatory Visit: Payer: Medicare Other | Admitting: Cardiology

## 2021-05-17 DIAGNOSIS — H903 Sensorineural hearing loss, bilateral: Secondary | ICD-10-CM | POA: Diagnosis not present

## 2021-06-26 DIAGNOSIS — H52203 Unspecified astigmatism, bilateral: Secondary | ICD-10-CM | POA: Diagnosis not present

## 2021-06-26 DIAGNOSIS — D3132 Benign neoplasm of left choroid: Secondary | ICD-10-CM | POA: Diagnosis not present

## 2021-06-26 DIAGNOSIS — Z961 Presence of intraocular lens: Secondary | ICD-10-CM | POA: Diagnosis not present

## 2021-06-26 DIAGNOSIS — H18513 Endothelial corneal dystrophy, bilateral: Secondary | ICD-10-CM | POA: Diagnosis not present

## 2021-07-15 DIAGNOSIS — U071 COVID-19: Secondary | ICD-10-CM | POA: Diagnosis not present

## 2021-07-30 DIAGNOSIS — Z1231 Encounter for screening mammogram for malignant neoplasm of breast: Secondary | ICD-10-CM | POA: Diagnosis not present

## 2021-07-31 DIAGNOSIS — M79641 Pain in right hand: Secondary | ICD-10-CM | POA: Diagnosis not present

## 2021-09-03 DIAGNOSIS — L821 Other seborrheic keratosis: Secondary | ICD-10-CM | POA: Diagnosis not present

## 2021-09-03 DIAGNOSIS — L719 Rosacea, unspecified: Secondary | ICD-10-CM | POA: Diagnosis not present

## 2021-09-03 DIAGNOSIS — D225 Melanocytic nevi of trunk: Secondary | ICD-10-CM | POA: Diagnosis not present

## 2021-09-03 DIAGNOSIS — L578 Other skin changes due to chronic exposure to nonionizing radiation: Secondary | ICD-10-CM | POA: Diagnosis not present

## 2021-09-03 DIAGNOSIS — D485 Neoplasm of uncertain behavior of skin: Secondary | ICD-10-CM | POA: Diagnosis not present

## 2021-09-03 DIAGNOSIS — Z85828 Personal history of other malignant neoplasm of skin: Secondary | ICD-10-CM | POA: Diagnosis not present

## 2021-09-03 DIAGNOSIS — L814 Other melanin hyperpigmentation: Secondary | ICD-10-CM | POA: Diagnosis not present

## 2021-09-03 DIAGNOSIS — H01119 Allergic dermatitis of unspecified eye, unspecified eyelid: Secondary | ICD-10-CM | POA: Diagnosis not present

## 2021-09-11 DIAGNOSIS — Z1389 Encounter for screening for other disorder: Secondary | ICD-10-CM | POA: Diagnosis not present

## 2021-09-11 DIAGNOSIS — Z0000 Encounter for general adult medical examination without abnormal findings: Secondary | ICD-10-CM | POA: Diagnosis not present

## 2021-09-13 DIAGNOSIS — I10 Essential (primary) hypertension: Secondary | ICD-10-CM | POA: Diagnosis not present

## 2021-09-13 DIAGNOSIS — H9193 Unspecified hearing loss, bilateral: Secondary | ICD-10-CM | POA: Diagnosis not present

## 2021-09-13 DIAGNOSIS — Z6823 Body mass index (BMI) 23.0-23.9, adult: Secondary | ICD-10-CM | POA: Diagnosis not present

## 2021-09-13 DIAGNOSIS — Z8619 Personal history of other infectious and parasitic diseases: Secondary | ICD-10-CM | POA: Diagnosis not present

## 2021-09-13 DIAGNOSIS — E7800 Pure hypercholesterolemia, unspecified: Secondary | ICD-10-CM | POA: Diagnosis not present

## 2021-09-13 DIAGNOSIS — M810 Age-related osteoporosis without current pathological fracture: Secondary | ICD-10-CM | POA: Diagnosis not present

## 2021-09-13 DIAGNOSIS — E559 Vitamin D deficiency, unspecified: Secondary | ICD-10-CM | POA: Diagnosis not present

## 2021-09-13 DIAGNOSIS — Z79899 Other long term (current) drug therapy: Secondary | ICD-10-CM | POA: Diagnosis not present

## 2021-09-13 DIAGNOSIS — I700 Atherosclerosis of aorta: Secondary | ICD-10-CM | POA: Diagnosis not present

## 2021-09-13 DIAGNOSIS — I2510 Atherosclerotic heart disease of native coronary artery without angina pectoris: Secondary | ICD-10-CM | POA: Diagnosis not present

## 2021-09-17 DIAGNOSIS — H01112 Allergic dermatitis of right lower eyelid: Secondary | ICD-10-CM | POA: Diagnosis not present

## 2021-09-17 DIAGNOSIS — H01115 Allergic dermatitis of left lower eyelid: Secondary | ICD-10-CM | POA: Diagnosis not present

## 2021-09-17 DIAGNOSIS — H0100A Unspecified blepharitis right eye, upper and lower eyelids: Secondary | ICD-10-CM | POA: Diagnosis not present

## 2021-09-17 DIAGNOSIS — H0100B Unspecified blepharitis left eye, upper and lower eyelids: Secondary | ICD-10-CM | POA: Diagnosis not present

## 2021-09-17 DIAGNOSIS — H01111 Allergic dermatitis of right upper eyelid: Secondary | ICD-10-CM | POA: Diagnosis not present

## 2021-09-17 DIAGNOSIS — H01114 Allergic dermatitis of left upper eyelid: Secondary | ICD-10-CM | POA: Diagnosis not present

## 2021-09-19 DIAGNOSIS — H1820 Unspecified corneal edema: Secondary | ICD-10-CM | POA: Diagnosis not present

## 2021-09-19 DIAGNOSIS — H01004 Unspecified blepharitis left upper eyelid: Secondary | ICD-10-CM | POA: Diagnosis not present

## 2021-10-09 ENCOUNTER — Other Ambulatory Visit: Payer: Self-pay

## 2021-10-09 DIAGNOSIS — E782 Mixed hyperlipidemia: Secondary | ICD-10-CM

## 2021-10-09 MED ORDER — ROSUVASTATIN CALCIUM 10 MG PO TABS: 20.0000 mg | ORAL_TABLET | ORAL | 3 refills | Status: DC

## 2021-10-10 DIAGNOSIS — M810 Age-related osteoporosis without current pathological fracture: Secondary | ICD-10-CM | POA: Diagnosis not present

## 2021-10-16 DIAGNOSIS — Z23 Encounter for immunization: Secondary | ICD-10-CM | POA: Diagnosis not present

## 2021-10-25 DIAGNOSIS — Z23 Encounter for immunization: Secondary | ICD-10-CM | POA: Diagnosis not present

## 2021-11-20 DIAGNOSIS — S00411A Abrasion of right ear, initial encounter: Secondary | ICD-10-CM | POA: Diagnosis not present

## 2021-11-20 DIAGNOSIS — Z6823 Body mass index (BMI) 23.0-23.9, adult: Secondary | ICD-10-CM | POA: Diagnosis not present

## 2021-11-29 DIAGNOSIS — H903 Sensorineural hearing loss, bilateral: Secondary | ICD-10-CM | POA: Diagnosis not present

## 2021-12-13 DIAGNOSIS — M810 Age-related osteoporosis without current pathological fracture: Secondary | ICD-10-CM | POA: Diagnosis not present

## 2021-12-27 DIAGNOSIS — M353 Polymyalgia rheumatica: Secondary | ICD-10-CM | POA: Diagnosis not present

## 2021-12-27 DIAGNOSIS — M8000XD Age-related osteoporosis with current pathological fracture, unspecified site, subsequent encounter for fracture with routine healing: Secondary | ICD-10-CM | POA: Diagnosis not present

## 2021-12-27 DIAGNOSIS — M1991 Primary osteoarthritis, unspecified site: Secondary | ICD-10-CM | POA: Diagnosis not present

## 2021-12-27 DIAGNOSIS — Z6823 Body mass index (BMI) 23.0-23.9, adult: Secondary | ICD-10-CM | POA: Diagnosis not present

## 2022-01-14 DIAGNOSIS — N3001 Acute cystitis with hematuria: Secondary | ICD-10-CM | POA: Diagnosis not present

## 2022-02-10 DIAGNOSIS — M47816 Spondylosis without myelopathy or radiculopathy, lumbar region: Secondary | ICD-10-CM | POA: Diagnosis not present

## 2022-02-10 DIAGNOSIS — Z6824 Body mass index (BMI) 24.0-24.9, adult: Secondary | ICD-10-CM | POA: Diagnosis not present

## 2022-02-17 DIAGNOSIS — M47816 Spondylosis without myelopathy or radiculopathy, lumbar region: Secondary | ICD-10-CM | POA: Diagnosis not present

## 2022-02-24 DIAGNOSIS — M47816 Spondylosis without myelopathy or radiculopathy, lumbar region: Secondary | ICD-10-CM | POA: Diagnosis not present

## 2022-02-28 DIAGNOSIS — M47816 Spondylosis without myelopathy or radiculopathy, lumbar region: Secondary | ICD-10-CM | POA: Diagnosis not present

## 2022-03-04 DIAGNOSIS — M47816 Spondylosis without myelopathy or radiculopathy, lumbar region: Secondary | ICD-10-CM | POA: Diagnosis not present

## 2022-03-07 DIAGNOSIS — M47816 Spondylosis without myelopathy or radiculopathy, lumbar region: Secondary | ICD-10-CM | POA: Diagnosis not present

## 2022-03-10 DIAGNOSIS — M47816 Spondylosis without myelopathy or radiculopathy, lumbar region: Secondary | ICD-10-CM | POA: Diagnosis not present

## 2022-03-14 DIAGNOSIS — M47816 Spondylosis without myelopathy or radiculopathy, lumbar region: Secondary | ICD-10-CM | POA: Diagnosis not present

## 2022-03-17 DIAGNOSIS — M47816 Spondylosis without myelopathy or radiculopathy, lumbar region: Secondary | ICD-10-CM | POA: Diagnosis not present

## 2022-03-22 ENCOUNTER — Other Ambulatory Visit: Payer: Self-pay | Admitting: Cardiology

## 2022-03-24 DIAGNOSIS — M47816 Spondylosis without myelopathy or radiculopathy, lumbar region: Secondary | ICD-10-CM | POA: Diagnosis not present

## 2022-04-09 DIAGNOSIS — M47816 Spondylosis without myelopathy or radiculopathy, lumbar region: Secondary | ICD-10-CM | POA: Diagnosis not present

## 2022-04-15 DIAGNOSIS — M810 Age-related osteoporosis without current pathological fracture: Secondary | ICD-10-CM | POA: Diagnosis not present

## 2022-04-23 DIAGNOSIS — M47816 Spondylosis without myelopathy or radiculopathy, lumbar region: Secondary | ICD-10-CM | POA: Diagnosis not present

## 2022-05-07 ENCOUNTER — Ambulatory Visit: Payer: Medicare Other | Admitting: Cardiology

## 2022-05-07 ENCOUNTER — Encounter: Payer: Self-pay | Admitting: Cardiology

## 2022-05-07 VITALS — BP 130/45 | HR 62 | Ht 66.0 in | Wt 146.0 lb

## 2022-05-07 DIAGNOSIS — E782 Mixed hyperlipidemia: Secondary | ICD-10-CM | POA: Diagnosis not present

## 2022-05-07 DIAGNOSIS — I351 Nonrheumatic aortic (valve) insufficiency: Secondary | ICD-10-CM | POA: Diagnosis not present

## 2022-05-07 DIAGNOSIS — I10 Essential (primary) hypertension: Secondary | ICD-10-CM | POA: Diagnosis not present

## 2022-05-07 MED ORDER — LOSARTAN POTASSIUM 25 MG PO TABS: 25.0000 mg | ORAL_TABLET | Freq: Every day | ORAL | 1 refills | Status: DC

## 2022-05-07 NOTE — Progress Notes (Signed)
Follow up visit  Subjective:   Angela [name], female    DOB: August 13, 1940, 82 y.o.   MRN: [id number]   HPI   Chief Complaint  Patient presents with   Mixed hyperlipidemia   Follow-up     82 y.o. Caucasian female with hypertension, hyperlipidemia, coronary atherosclerosis, [name]  Patient is doing well, tolerating rosuvastatin 10 mg every other day fairly well. She has recently noticed bilateral leg edema.    Current Outpatient Medications:    amLODipine (NORVASC) 5 MG tablet, Take 1 tablet by mouth twice daily, Disp: 180 tablet, Rfl: 0   Cholecalciferol (VITAMIN D-3 PO), Take 1 capsule by mouth daily., Disp: , Rfl:    denosumab (PROLIA) 60 MG/ML SOSY injection, Inject 60 mg into the skin every 6 (six) months., Disp: , Rfl:    Multiple Vitamins-Minerals (MULTIVITAMIN PO), Take 1 tablet by mouth daily. , Disp: , Rfl:    Omega-3 Fatty Acids (FISH OIL PO), Take 1 capsule by mouth daily. , Disp: , Rfl:    rosuvastatin (CRESTOR) 10 MG tablet, Take 2 tablets (20 mg total) by mouth every other day., Disp: 90 tablet, Rfl: 3  Cardiovascular & other pertient studies:  EKG 05/07/2022: Sinus rhythm 65 bpm  Normal EKG  Echocardiogram 05/02/2019:  Normal LV systolic function with visual EF 60-65%. Left ventricle cavity is normal in size. Normal global wall motion.  Normal diastolic filling pattern, normal LAP. Calculated EF 61%.  Mild (Grade I) aortic regurgitation.  Mild (Grade I) mitral regurgitation.  No prior study for comparison.  Lexiscan myoview stress test 10/12/2017: 1. The resting electrocardiogram demonstrated normal sinus rhythm, normal resting conduction, no resting arrhythmias and normal rest repolarization. Stress EKG is nondiagnostic for ischemia as a pharmacologic stress test. 2. Myocardial perfusion imaging is normal. Overall left ventricular systolic function was normal without regional wall motion abnormalities. The left ventricular ejection fraction was 82%. 3.  Low risk study.   CT Chest 09/25/2017: 1. Small nonspecific pulmonary nodules within the RIGHT lung, both measuring 4 mm with 1 likely corresponding to the recent chest x-ray finding, as well as chronic appearing nodular scarring/atelectasis along the pleura of the RIGHT upper lobe.  Recommend follow-up noncontrast chest CT in 12 months to ensure stability of all findings. 2. Coronary artery calcifications, particularly dense within the LEFT main and LEFT anterior descending coronary arteries.  Recommend correlation with any possible associated cardiac symptoms.  Recent labs: 09/13/2021: Glucose 79, BUN/Cr 15/0.7. EGFR 79.K 4..7.  05/07/2021: Chol 176, TG 48, HDL 72, LDL 94  08/31/2019: Glucose 81, BUN/Cr 15/0.7. EGFR 85 Chol 265, TG 140, HDL 75, LDL 165   Review of Systems  Cardiovascular:  Positive for leg swelling. Negative for chest pain, dyspnea on exertion, palpitations and syncope.  Gastrointestinal:  Positive for nausea (With simvastatin, resolved after stopping it).        Vitals:   05/07/22 1246  BP: (!) 130/45  Pulse: 62  SpO2: 99%     Body mass index is 23.57 kg/m. Filed Weights   05/07/22 1246  Weight: 146 lb (66.2 kg)     Objective:   Physical Exam Vitals and nursing note reviewed.  Constitutional:      General: She is not in acute distress. Neck:     Vascular: No JVD.  Cardiovascular:     Rate and Rhythm: Normal rate and regular rhythm.     Heart sounds: Normal heart sounds. No murmur heard. Pulmonary:     Effort: Pulmonary effort  is normal.     Breath sounds: Normal breath sounds. No wheezing or rales.  Musculoskeletal:     Right lower leg: Edema (1+) present.     Left lower leg: Edema (1+) present.         Assessment & Recommendations:   82 y.o. Caucasian female with hypertension, hyperlipidemia, coronary atherosclerosis, [name]  Coronary artery calcification: No angina symptoms. Nuclear stress test with no iscehmia, infarction  (09/2017). Continue risk factor modification. Okay not to be on Aspirin in absence of obstructive CAD Statin intolerance to several statins, but able to tolerate rosuvastatin 10 mg every other day. Lipids fairly well controlled. Encouraged her to increase rosuvastatin to every day. In the past, PCSK9 inhibitor was not approved by insurance.  Reasonable to continue current conservative approach, as above.  Check lipid panel.  Hypertension: Well controlled with amlodipine 5 mg daily, but has leg edema. Will stop and see how he does without it.   Mild AI, MR: Clinically asymptomatic. Wide pulse pressure likely due to AI. Check echocardiogram.  F/u in 4 weeks   [name], MD Pager: [phone] Office: [phone]

## 2022-05-08 DIAGNOSIS — E782 Mixed hyperlipidemia: Secondary | ICD-10-CM | POA: Diagnosis not present

## 2022-05-08 DIAGNOSIS — I10 Essential (primary) hypertension: Secondary | ICD-10-CM | POA: Diagnosis not present

## 2022-05-09 LAB — BASIC METABOLIC PANEL
BUN/Creatinine Ratio: 18 (ref 12–28)
BUN: 14 mg/dL (ref 8–27)
CO2: 24 mmol/L (ref 20–29)
Calcium: 9.5 mg/dL (ref 8.7–10.3)
Chloride: 104 mmol/L (ref 96–106)
Creatinine, Ser: 0.77 mg/dL (ref 0.57–1.00)
Glucose: 85 mg/dL (ref 70–99)
Potassium: 4.5 mmol/L (ref 3.5–5.2)
Sodium: 140 mmol/L (ref 134–144)
eGFR: 78 mL/min/{1.73_m2} (ref >59)

## 2022-05-09 LAB — LIPID PANEL
Chol/HDL Ratio: 2.1 ratio (ref 0.0–4.4)
Cholesterol, Total: 158 mg/dL (ref 100–199)
HDL: 76 mg/dL (ref >39)
LDL Chol Calc (NIH): 69 mg/dL (ref 0–99)
Triglycerides: 64 mg/dL (ref 0–149)
VLDL Cholesterol Cal: 13 mg/dL (ref 5–40)

## 2022-05-15 ENCOUNTER — Ambulatory Visit: Payer: Medicare Other | Admitting: Cardiology

## 2022-05-15 DIAGNOSIS — L719 Rosacea, unspecified: Secondary | ICD-10-CM | POA: Diagnosis not present

## 2022-05-23 ENCOUNTER — Ambulatory Visit: Payer: Medicare Other

## 2022-05-23 DIAGNOSIS — I351 Nonrheumatic aortic (valve) insufficiency: Secondary | ICD-10-CM | POA: Diagnosis not present

## 2022-05-25 NOTE — Progress Notes (Deleted)
Follow up visit  Subjective:   Angela [name], female    DOB: Jun 05, 1940, 82 y.o.   MRN: [id number]   HPI   No chief complaint on file.    82 y.o. Caucasian female with hypertension, hyperlipidemia, coronary atherosclerosis, [name]  Patient is doing well, tolerating rosuvastatin 10 mg every other day fairly well. She has recently noticed bilateral leg edema.    Current Outpatient Medications:    Cholecalciferol (VITAMIN D-3 PO), Take 1 capsule by mouth daily., Disp: , Rfl:    denosumab (PROLIA) 60 MG/ML SOSY injection, Inject 60 mg into the skin every 6 (six) months., Disp: , Rfl:    Multiple Vitamins-Minerals (MULTIVITAMIN PO), Take 1 tablet by mouth daily. , Disp: , Rfl:    Omega-3 Fatty Acids (FISH OIL PO), Take 1 capsule by mouth daily. , Disp: , Rfl:    rosuvastatin (CRESTOR) 10 MG tablet, Take 2 tablets (20 mg total) by mouth every other day., Disp: 90 tablet, Rfl: 3  Cardiovascular & other pertient studies:  EKG 05/07/2022: Sinus rhythm 65 bpm  Normal EKG  Echocardiogram 05/23/2022: Normal LV systolic function with visual EF 60-65%. Left ventricle cavity is normal in size. Normal left ventricular wall thickness. Normal global wall motion. Normal diastolic filling pattern, normal LAP. Calculated EF 63%. Structurally normal trileaflet aortic valve.  Mild (Grade I) aortic regurgitation. Structurally normal mitral valve.  Mild to moderate mitral regurgitation. Structurally normal tricuspid valve.  Mild tricuspid regurgitation. No evidence of pulmonary hypertension. No significant change compared to 04/2019.  Lexiscan myoview stress test 10/12/2017: 1. The resting electrocardiogram demonstrated normal sinus rhythm, normal resting conduction, no resting arrhythmias and normal rest repolarization. Stress EKG is nondiagnostic for ischemia as a pharmacologic stress test. 2. Myocardial perfusion imaging is normal. Overall left ventricular systolic function was normal without  regional wall motion abnormalities. The left ventricular ejection fraction was 82%. 3. Low risk study.   CT Chest 09/25/2017: 1. Small nonspecific pulmonary nodules within the RIGHT lung, both measuring 4 mm with 1 likely corresponding to the recent chest x-ray finding, as well as chronic appearing nodular scarring/atelectasis along the pleura of the RIGHT upper lobe.  Recommend follow-up noncontrast chest CT in 12 months to ensure stability of all findings. 2. Coronary artery calcifications, particularly dense within the LEFT main and LEFT anterior descending coronary arteries.  Recommend correlation with any possible associated cardiac symptoms.  Recent labs: 09/13/2021: Glucose 79, BUN/Cr 15/0.7. EGFR 79.K 4..7.  05/07/2021: Chol 176, TG 48, HDL 72, LDL 94  08/31/2019: Glucose 81, BUN/Cr 15/0.7. EGFR 85 Chol 265, TG 140, HDL 75, LDL 165   Review of Systems  Cardiovascular:  Positive for leg swelling. Negative for chest pain, dyspnea on exertion, palpitations and syncope.  Gastrointestinal:  Positive for nausea (With simvastatin, resolved after stopping it).        There were no vitals filed for this visit.    There is no height or weight on file to calculate BMI. There were no vitals filed for this visit.    Objective:   Physical Exam Vitals and nursing note reviewed.  Constitutional:      General: She is not in acute distress. Neck:     Vascular: No JVD.  Cardiovascular:     Rate and Rhythm: Normal rate and regular rhythm.     Heart sounds: Normal heart sounds. No murmur heard. Pulmonary:     Effort: Pulmonary effort is normal.     Breath sounds: Normal breath sounds.  No wheezing or rales.  Musculoskeletal:     Right lower leg: Edema (1+) present.     Left lower leg: Edema (1+) present.         Assessment & Recommendations:   82 y.o. Caucasian female with hypertension, hyperlipidemia, coronary atherosclerosis, [name]  Coronary artery calcification: No  angina symptoms. Nuclear stress test with no iscehmia, infarction (09/2017). Continue risk factor modification. Okay not to be on Aspirin in absence of obstructive CAD Statin intolerance to several statins, but able to tolerate rosuvastatin 10 mg every other day. Lipids fairly well controlled. Encouraged her to increase rosuvastatin to every day. In the past, PCSK9 inhibitor was not approved by insurance.  Reasonable to continue current conservative approach, as above.  Check lipid panel.  Hypertension: Well controlled with amlodipine 5 mg daily, but has leg edema. Will stop and see how he does without it.   Mild AI, MR: Clinically asymptomatic. Wide pulse pressure likely due to AI. Check echocardiogram.  F/u in 4 weeks   [name], MD Pager: [phone] Office: [phone]

## 2022-06-05 ENCOUNTER — Ambulatory Visit: Payer: Medicare Other | Admitting: Cardiology

## 2022-06-05 DIAGNOSIS — J019 Acute sinusitis, unspecified: Secondary | ICD-10-CM | POA: Diagnosis not present

## 2022-06-19 ENCOUNTER — Encounter: Payer: Self-pay | Admitting: Cardiology

## 2022-06-19 ENCOUNTER — Ambulatory Visit: Payer: Medicare Other | Admitting: Cardiology

## 2022-06-19 VITALS — BP 164/45 | HR 53 | Resp 16 | Ht 66.0 in | Wt 142.0 lb

## 2022-06-19 DIAGNOSIS — E782 Mixed hyperlipidemia: Secondary | ICD-10-CM | POA: Diagnosis not present

## 2022-06-19 DIAGNOSIS — I351 Nonrheumatic aortic (valve) insufficiency: Secondary | ICD-10-CM

## 2022-06-19 DIAGNOSIS — I10 Essential (primary) hypertension: Secondary | ICD-10-CM

## 2022-06-19 NOTE — Progress Notes (Addendum)
Follow up visit  Subjective:   Angela [name], female    DOB: [birth year], 82 y.o.   MRN: [id number]   HPI   Chief Complaint  Patient presents with   Hyperlipidemia   Coronary Artery Disease   Follow-up    4 week   Results    echo     82 y.o. Caucasian female with hypertension, hyperlipidemia, coronary atherosclerosis, [name]  Patient is doing well, tolerating rosuvastatin 10 mg daily. Is completely resolved after stopping amlodipine.  Blood pressure well-controlled at home, home log attached under media.  Blood pressure elevated today in the office.  Reviewed recent test results with the patient, details below.     Current Outpatient Medications:    Cholecalciferol (VITAMIN D-3 PO), Take 1 capsule by mouth daily., Disp: , Rfl:    denosumab (PROLIA) 60 MG/ML SOSY injection, Inject 60 mg into the skin every 6 (six) months., Disp: , Rfl:    Multiple Vitamins-Minerals (MULTIVITAMIN PO), Take 1 tablet by mouth daily. , Disp: , Rfl:    Omega-3 Fatty Acids (FISH OIL PO), Take 1 capsule by mouth daily. , Disp: , Rfl:    rosuvastatin (CRESTOR) 10 MG tablet, Take 2 tablets (20 mg total) by mouth every other day., Disp: 90 tablet, Rfl: 3  Cardiovascular & other pertient studies:  EKG 05/07/2022: Sinus rhythm 65 bpm  Normal EKG  Echocardiogram 05/23/2022:  Normal LV systolic function with visual EF 60-65%. Left ventricle cavity  is normal in size. Normal left ventricular wall thickness. Normal global  wall motion. Normal diastolic filling pattern, normal LAP. Calculated EF  63%.  Structurally normal trileaflet aortic valve.  Mild (Grade I) aortic  regurgitation.  Structurally normal mitral valve.  Mild to moderate mitral regurgitation.  Structurally normal tricuspid valve.  Mild tricuspid regurgitation. No  evidence of pulmonary hypertension.  No significant change compared to 04/2019.   Lexiscan myoview stress test 10/12/2017: 1. The resting electrocardiogram demonstrated  normal sinus rhythm, normal resting conduction, no resting arrhythmias and normal rest repolarization. Stress EKG is nondiagnostic for ischemia as a pharmacologic stress test. 2. Myocardial perfusion imaging is normal. Overall left ventricular systolic function was normal without regional wall motion abnormalities. The left ventricular ejection fraction was 82%. 3. Low risk study.   CT Chest 09/25/2017: 1. Small nonspecific pulmonary nodules within the RIGHT lung, both measuring 4 mm with 1 likely corresponding to the recent chest x-ray finding, as well as chronic appearing nodular scarring/atelectasis along the pleura of the RIGHT upper lobe.  Recommend follow-up noncontrast chest CT in 12 months to ensure stability of all findings. 2. Coronary artery calcifications, particularly dense within the LEFT main and LEFT anterior descending coronary arteries.  Recommend correlation with any possible associated cardiac symptoms.  Recent labs: 05/08/2022: Glucose 18, BUN/Cr 14/0.77. EGFR 78. Na/K 140/4.5.  Chol 158, TG 64, HDL 76, LDL 69  09/13/2021: Glucose 79, BUN/Cr 15/0.7. EGFR 79.K 4..7.  05/07/2021: Chol 176, TG 48, HDL 72, LDL 94  08/31/2019: Glucose 81, BUN/Cr 15/0.7. EGFR 85 Chol 265, TG 140, HDL 75, LDL 165   Review of Systems  Cardiovascular:  Negative for chest pain, dyspnea on exertion, leg swelling, palpitations and syncope.  Gastrointestinal:  Positive for nausea (With simvastatin, resolved after stopping it).        Vitals:   06/19/22 1118  BP: (!) 164/45  Pulse: (!) 53  Resp: 16  SpO2: 97%     There is no height or weight on file to  calculate BMI. There were no vitals filed for this visit.    Objective:   Physical Exam Vitals and nursing note reviewed.  Constitutional:      General: She is not in acute distress. Neck:     Vascular: No JVD.  Cardiovascular:     Rate and Rhythm: Normal rate and regular rhythm.     Heart sounds: Normal heart sounds. No  murmur heard. Pulmonary:     Effort: Pulmonary effort is normal.     Breath sounds: Normal breath sounds. No wheezing or rales.  Musculoskeletal:     Right lower leg: No edema.     Left lower leg: No edema.         Assessment & Recommendations:   82 y.o. Caucasian female with hypertension, hyperlipidemia, coronary atherosclerosis, [name]  Coronary artery calcification: No angina symptoms. Nuclear stress test with no iscehmia, infarction (09/2017). Continue risk factor modification. Okay not to be on Aspirin in absence of obstructive CAD Statin intolerance to several statins, but able to tolerate rosuvastatin 10 mg every other day. Chol 158, TG 64, HDL 76, LDL 69 (04/2022). Continue the same. Continue In the past, PCSK9 inhibitor was not approved by insurance.   Hypertension: Suspect component of whitecoat hypertension. Continue to hold antihypertensive therapy, unless SBP is consistently >140 mmHg.  At that time, could consider adding losartan-hydrochlorothiazide.    Mild AI, MR: Mild AI, mild to moderate MR, mild TR (echocardiogram 05/2022). Other than slightly wide pulse pressure, she is asymptomatic. Repeat echocardiogram in 01/2024 (will order at next visit).  F/u in 1 year    [name], MD Pager: [phone] Office: [phone]

## 2022-07-08 DIAGNOSIS — Z961 Presence of intraocular lens: Secondary | ICD-10-CM | POA: Diagnosis not present

## 2022-07-08 DIAGNOSIS — D3132 Benign neoplasm of left choroid: Secondary | ICD-10-CM | POA: Diagnosis not present

## 2022-07-08 DIAGNOSIS — H52203 Unspecified astigmatism, bilateral: Secondary | ICD-10-CM | POA: Diagnosis not present

## 2022-07-08 DIAGNOSIS — H02403 Unspecified ptosis of bilateral eyelids: Secondary | ICD-10-CM | POA: Diagnosis not present

## 2022-07-30 DIAGNOSIS — S76012A Strain of muscle, fascia and tendon of left hip, initial encounter: Secondary | ICD-10-CM | POA: Diagnosis not present

## 2022-08-05 DIAGNOSIS — Z1231 Encounter for screening mammogram for malignant neoplasm of breast: Secondary | ICD-10-CM | POA: Diagnosis not present

## 2022-08-06 DIAGNOSIS — R1030 Lower abdominal pain, unspecified: Secondary | ICD-10-CM | POA: Diagnosis not present

## 2022-08-06 DIAGNOSIS — T50905A Adverse effect of unspecified drugs, medicaments and biological substances, initial encounter: Secondary | ICD-10-CM | POA: Diagnosis not present

## 2022-08-06 DIAGNOSIS — S76012D Strain of muscle, fascia and tendon of left hip, subsequent encounter: Secondary | ICD-10-CM | POA: Diagnosis not present

## 2022-08-10 DIAGNOSIS — R1084 Generalized abdominal pain: Secondary | ICD-10-CM | POA: Diagnosis not present

## 2022-08-10 DIAGNOSIS — K5900 Constipation, unspecified: Secondary | ICD-10-CM | POA: Diagnosis not present

## 2022-09-16 DIAGNOSIS — L578 Other skin changes due to chronic exposure to nonionizing radiation: Secondary | ICD-10-CM | POA: Diagnosis not present

## 2022-09-16 DIAGNOSIS — L814 Other melanin hyperpigmentation: Secondary | ICD-10-CM | POA: Diagnosis not present

## 2022-09-16 DIAGNOSIS — Z85828 Personal history of other malignant neoplasm of skin: Secondary | ICD-10-CM | POA: Diagnosis not present

## 2022-09-16 DIAGNOSIS — D225 Melanocytic nevi of trunk: Secondary | ICD-10-CM | POA: Diagnosis not present

## 2022-09-16 DIAGNOSIS — C4A31 Merkel cell carcinoma of nose: Secondary | ICD-10-CM | POA: Diagnosis not present

## 2022-09-16 DIAGNOSIS — D485 Neoplasm of uncertain behavior of skin: Secondary | ICD-10-CM | POA: Diagnosis not present

## 2022-09-16 DIAGNOSIS — L821 Other seborrheic keratosis: Secondary | ICD-10-CM | POA: Diagnosis not present

## 2022-09-17 DIAGNOSIS — Z8619 Personal history of other infectious and parasitic diseases: Secondary | ICD-10-CM | POA: Diagnosis not present

## 2022-09-17 DIAGNOSIS — E7800 Pure hypercholesterolemia, unspecified: Secondary | ICD-10-CM | POA: Diagnosis not present

## 2022-09-17 DIAGNOSIS — M810 Age-related osteoporosis without current pathological fracture: Secondary | ICD-10-CM | POA: Diagnosis not present

## 2022-09-17 DIAGNOSIS — Z6821 Body mass index (BMI) 21.0-21.9, adult: Secondary | ICD-10-CM | POA: Diagnosis not present

## 2022-09-17 DIAGNOSIS — Z1389 Encounter for screening for other disorder: Secondary | ICD-10-CM | POA: Diagnosis not present

## 2022-09-17 DIAGNOSIS — Z0000 Encounter for general adult medical examination without abnormal findings: Secondary | ICD-10-CM | POA: Diagnosis not present

## 2022-09-17 DIAGNOSIS — I700 Atherosclerosis of aorta: Secondary | ICD-10-CM | POA: Diagnosis not present

## 2022-09-29 DIAGNOSIS — C4A31 Merkel cell carcinoma of nose: Secondary | ICD-10-CM | POA: Diagnosis not present

## 2022-09-30 DIAGNOSIS — H903 Sensorineural hearing loss, bilateral: Secondary | ICD-10-CM | POA: Diagnosis not present

## 2022-10-01 DIAGNOSIS — C4A9 Merkel cell carcinoma, unspecified: Secondary | ICD-10-CM | POA: Diagnosis not present

## 2022-10-01 DIAGNOSIS — C4331 Malignant melanoma of nose: Secondary | ICD-10-CM | POA: Diagnosis not present

## 2022-10-04 DIAGNOSIS — I6781 Acute cerebrovascular insufficiency: Secondary | ICD-10-CM | POA: Diagnosis not present

## 2022-10-04 DIAGNOSIS — G319 Degenerative disease of nervous system, unspecified: Secondary | ICD-10-CM | POA: Diagnosis not present

## 2022-10-04 DIAGNOSIS — C4A9 Merkel cell carcinoma, unspecified: Secondary | ICD-10-CM | POA: Diagnosis not present

## 2022-10-04 DIAGNOSIS — J3489 Other specified disorders of nose and nasal sinuses: Secondary | ICD-10-CM | POA: Diagnosis not present

## 2022-10-04 DIAGNOSIS — C4A8 Merkel cell carcinoma of overlapping sites: Secondary | ICD-10-CM | POA: Diagnosis not present

## 2022-10-09 DIAGNOSIS — C4A9 Merkel cell carcinoma, unspecified: Secondary | ICD-10-CM | POA: Diagnosis not present

## 2022-10-09 DIAGNOSIS — Z79899 Other long term (current) drug therapy: Secondary | ICD-10-CM | POA: Diagnosis not present

## 2022-10-09 DIAGNOSIS — C4A31 Merkel cell carcinoma of nose: Secondary | ICD-10-CM | POA: Diagnosis not present

## 2022-10-09 DIAGNOSIS — H903 Sensorineural hearing loss, bilateral: Secondary | ICD-10-CM | POA: Diagnosis not present

## 2022-10-09 DIAGNOSIS — C4331 Malignant melanoma of nose: Secondary | ICD-10-CM | POA: Diagnosis not present

## 2022-10-14 DIAGNOSIS — C770 Secondary and unspecified malignant neoplasm of lymph nodes of head, face and neck: Secondary | ICD-10-CM | POA: Diagnosis not present

## 2022-10-14 DIAGNOSIS — C4A31 Merkel cell carcinoma of nose: Secondary | ICD-10-CM | POA: Diagnosis not present

## 2022-10-14 DIAGNOSIS — C4331 Malignant melanoma of nose: Secondary | ICD-10-CM | POA: Diagnosis not present

## 2022-10-20 DIAGNOSIS — C4A9 Merkel cell carcinoma, unspecified: Secondary | ICD-10-CM | POA: Diagnosis not present

## 2022-10-23 ENCOUNTER — Other Ambulatory Visit: Payer: Self-pay

## 2022-10-23 ENCOUNTER — Emergency Department (HOSPITAL_BASED_OUTPATIENT_CLINIC_OR_DEPARTMENT_OTHER): Payer: Medicare Other

## 2022-10-23 ENCOUNTER — Emergency Department (HOSPITAL_BASED_OUTPATIENT_CLINIC_OR_DEPARTMENT_OTHER)
Admission: EM | Admit: 2022-10-23 | Discharge: 2022-10-24 | Disposition: A | Discharge status: Home / Self Care | Payer: Medicare Other | Attending: Emergency Medicine | Admitting: Emergency Medicine

## 2022-10-23 ENCOUNTER — Encounter (HOSPITAL_BASED_OUTPATIENT_CLINIC_OR_DEPARTMENT_OTHER): Payer: Self-pay | Admitting: Emergency Medicine

## 2022-10-23 ENCOUNTER — Emergency Department (HOSPITAL_BASED_OUTPATIENT_CLINIC_OR_DEPARTMENT_OTHER): Payer: Medicare Other | Admitting: Radiology

## 2022-10-23 DIAGNOSIS — Z8522 Personal history of malignant neoplasm of nasal cavities, middle ear, and accessory sinuses: Secondary | ICD-10-CM | POA: Insufficient documentation

## 2022-10-23 DIAGNOSIS — Z85821 Personal history of Merkel cell carcinoma: Secondary | ICD-10-CM | POA: Diagnosis not present

## 2022-10-23 DIAGNOSIS — Z79899 Other long term (current) drug therapy: Secondary | ICD-10-CM | POA: Diagnosis not present

## 2022-10-23 DIAGNOSIS — K5792 Diverticulitis of intestine, part unspecified, without perforation or abscess without bleeding: Secondary | ICD-10-CM | POA: Diagnosis not present

## 2022-10-23 DIAGNOSIS — K5900 Constipation, unspecified: Secondary | ICD-10-CM | POA: Diagnosis present

## 2022-10-23 DIAGNOSIS — K5730 Diverticulosis of large intestine without perforation or abscess without bleeding: Secondary | ICD-10-CM | POA: Diagnosis not present

## 2022-10-23 DIAGNOSIS — N200 Calculus of kidney: Secondary | ICD-10-CM | POA: Diagnosis not present

## 2022-10-23 DIAGNOSIS — I10 Essential (primary) hypertension: Secondary | ICD-10-CM | POA: Insufficient documentation

## 2022-10-23 LAB — CBC WITH DIFFERENTIAL/PLATELET
Abs Immature Granulocytes: 0.02 10*3/uL (ref 0.00–0.07)
Basophils Absolute: 0 10*3/uL (ref 0.0–0.1)
Basophils Relative: 0 %
Eosinophils Absolute: 0.1 10*3/uL (ref 0.0–0.5)
Eosinophils Relative: 1 %
HCT: 39 % (ref 36.0–46.0)
Hemoglobin: 13.3 g/dL (ref 12.0–15.0)
Immature Granulocytes: 0 %
Lymphocytes Relative: 36 %
Lymphs Abs: 3.5 10*3/uL (ref 0.7–4.0)
MCH: 30.4 pg (ref 26.0–34.0)
MCHC: 34.1 g/dL (ref 30.0–36.0)
MCV: 89 fL (ref 80.0–100.0)
Monocytes Absolute: 1.1 10*3/uL — ABNORMAL HIGH (ref 0.1–1.0)
Monocytes Relative: 11 %
Neutro Abs: 5 10*3/uL (ref 1.7–7.7)
Neutrophils Relative %: 52 %
Platelets: 216 10*3/uL (ref 150–400)
RBC: 4.38 MIL/uL (ref 3.87–5.11)
RDW: 14.2 % (ref 11.5–15.5)
WBC: 9.7 10*3/uL (ref 4.0–10.5)
nRBC: 0 % (ref 0.0–0.2)

## 2022-10-23 LAB — COMPREHENSIVE METABOLIC PANEL
ALT: 12 U/L (ref 0–44)
AST: 18 U/L (ref 15–41)
Albumin: 4.4 g/dL (ref 3.5–5.0)
Alkaline Phosphatase: 53 U/L (ref 38–126)
Anion gap: 7 (ref 5–15)
BUN: 14 mg/dL (ref 8–23)
CO2: 27 mmol/L (ref 22–32)
Calcium: 9.6 mg/dL (ref 8.9–10.3)
Chloride: 95 mmol/L — ABNORMAL LOW (ref 98–111)
Creatinine, Ser: 0.71 mg/dL (ref 0.44–1.00)
GFR, Estimated: >60 mL/min (ref >60)
Glucose, Bld: 102 mg/dL — ABNORMAL HIGH (ref 70–99)
Potassium: 4.2 mmol/L (ref 3.5–5.1)
Sodium: 129 mmol/L — ABNORMAL LOW (ref 135–145)
Total Bilirubin: 0.6 mg/dL (ref 0.3–1.2)
Total Protein: 7 g/dL (ref 6.5–8.1)

## 2022-10-23 MED ORDER — BISACODYL 10 MG RE SUPP: 10.0000 mg | RECTAL | 0 refills | Status: DC | PRN

## 2022-10-23 MED ORDER — IOHEXOL 300 MG/ML  SOLN
100.0000 mL | Freq: Once | INTRAMUSCULAR | Status: AC | PRN
  Administered 2022-10-23: 85 mL via INTRAVENOUS

## 2022-10-23 MED ORDER — POLYETHYLENE GLYCOL 3350 17 G PO PACK: 17.0000 g | PACK | Freq: Every day | ORAL | 0 refills | Status: DC

## 2022-10-23 NOTE — ED Provider Notes (Signed)
[place] [facility]GENC[facility] DEPARTMENT AT [facility] Provider Note   CSN: [id number] Arrival date & time: 10/23/22  2007     History  Chief Complaint  Patient presents with   Constipation    Angela [name] is a 82 y.o. female.  82 year old female with a history of prior hypertension, Merkel cell carcinoma, and nasal cancer status post resection who presents to the emergency department with constipation.  Patient reports that for the past 6 days has been constipated.  Has still been passing gas.  No nausea or vomiting.  No abdominal pain.  Says that she had a CT scan that showed possible diverticulitis during one of her Merkel cell staging PET/CT scans in late September with [facility].  Denies any abdominal surgeries.  Did have a small volume bowel movement yesterday.  Has tried MiraLAX at home without resolution of her symptoms.  Had a history of hypertension but her cardiologist stopped her blood pressure medication when her home blood pressure were normal.  Was felt that she had significant whitecoat hypertension.       Home Medications Prior to Admission medications   Medication Sig Start Date End Date Taking? Authorizing Provider  bisacodyl (DULCOLAX) 10 MG suppository Place 1 suppository (10 mg total) rectally as needed for moderate constipation. 10/23/22  Yes Rondel Baton, MD  polyethylene glycol (MIRALAX) 17 g packet Take 17 g by mouth daily. 10/23/22  Yes Rondel Baton, MD  Cholecalciferol (VITAMIN D-3 PO) Take 1 capsule by mouth daily.    [provider]  denosumab (PROLIA) 60 MG/ML SOSY injection Inject 60 mg into the skin every 6 (six) months.    [provider]  Multiple Vitamins-Minerals (MULTIVITAMIN PO) Take 1 tablet by mouth daily.     [provider]  Omega-3 Fatty Acids (FISH OIL PO) Take 1 capsule by mouth daily.     [provider]  rosuvastatin (CRESTOR) 10 MG tablet Take 2 tablets (20 mg  total) by mouth every other day. 10/09/21   Patwardhan, Anabel Bene, MD      Allergies    Other and Statins    Review of Systems   Review of Systems  Physical Exam Updated Vital Signs BP (!) 154/58   Pulse 73   Temp 98.1 F (36.7 C) (Oral)   Resp 18   Ht 5\' 6"  (1.676 m)   Wt 64.4 kg   LMP 01/13/1993   SpO2 100%   BMI 22.92 kg/m  Physical Exam Vitals and nursing note reviewed.  Constitutional:      General: She is not in acute distress.    Appearance: She is well-developed.  HENT:     Head: Normocephalic and atraumatic.     Right Ear: External ear normal.     Left Ear: External ear normal.     Nose: Nose normal.  Eyes:     Extraocular Movements: Extraocular movements intact.     Conjunctiva/sclera: Conjunctivae normal.     Pupils: Pupils are equal, round, and reactive to light.  Pulmonary:     Effort: Pulmonary effort is normal. No respiratory distress.  Abdominal:     General: Abdomen is flat. There is no distension.     Palpations: Abdomen is soft. There is no mass.     Tenderness: There is no abdominal tenderness. There is no guarding.  Musculoskeletal:     Cervical back: Normal range of motion and neck supple.     Right lower leg: No edema.  Left lower leg: No edema.  Skin:    General: Skin is warm and dry.  Neurological:     Mental Status: She is alert and oriented to person, place, and time. Mental status is at baseline.  Psychiatric:        Mood and Affect: Mood normal.     ED Results / Procedures / Treatments   Labs (all labs ordered are listed, but only abnormal results are displayed) Labs Reviewed  COMPREHENSIVE METABOLIC PANEL - Abnormal; Notable for the following components:      Result Value   Sodium 129 (*)    Chloride 95 (*)    Glucose, Bld 102 (*)    All other components within normal limits  CBC WITH DIFFERENTIAL/PLATELET - Abnormal; Notable for the following components:   Monocytes Absolute 1.1 (*)    All other components within  normal limits    EKG None  Radiology CT ABDOMEN PELVIS W CONTRAST  Result Date: 10/24/2022 CLINICAL DATA:  Constipation. History of diverticulitis and constipation. EXAM: CT ABDOMEN AND PELVIS WITH CONTRAST TECHNIQUE: Multidetector CT imaging of the abdomen and pelvis was performed using the standard protocol following bolus administration of intravenous contrast. RADIATION DOSE REDUCTION: This exam was performed according to the departmental dose-optimization program which includes automated exposure control, adjustment of the mA and/or kV according to patient size and/or use of iterative reconstruction technique. CONTRAST:  85mL OMNIPAQUE IOHEXOL 300 MG/ML  SOLN COMPARISON:  04/11/2008. FINDINGS: Lower chest: No acute abnormality. Hepatobiliary: No focal liver abnormality is seen. No gallstones, gallbladder wall thickening, or biliary dilatation. Pancreas: Unremarkable. No pancreatic ductal dilatation or surrounding inflammatory changes. Spleen: Normal in size without focal abnormality. Adrenals/Urinary Tract: The adrenal glands are within normal limits. The kidneys enhance symmetrically. A nonobstructive renal calculus is noted on the left. No hydroureteronephrosis bilaterally. Bladder is unremarkable. Stomach/Bowel: Stomach is within normal limits. Appendix appears normal. No evidence of bowel wall thickening, distention, or inflammatory changes. No free air or pneumatosis. Scattered diverticula are present along the colon without evidence of diverticulitis. A moderate amount of retained stool is present in the colon. Vascular/Lymphatic: Aortic atherosclerosis. No enlarged abdominal or pelvic lymph nodes. Reproductive: Uterus and bilateral adnexa are unremarkable. Other: No abdominopelvic ascites. Musculoskeletal: Degenerative changes are present in the thoracolumbar spine. 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 Enlargement of the hamstring musculature bilaterally with dystrophic  calcifications, progressive from 2010. No acute osseous abnormality. IMPRESSION: 1. No acute intra-abdominal process. 2. Diverticulosis without diverticulitis. 3. Moderate amount of stool in the colon, suggesting constipation. 4. Nonobstructive left renal calculus. 5. Aortic atherosclerosis. Electronically Signed   By: Thornell Sartorius M.D.   On: 10/24/2022 00:35   DG Abdomen 1 View  Result Date: 10/23/2022 CLINICAL DATA:  Constipation EXAM: ABDOMEN - 1 VIEW COMPARISON:  None Available. FINDINGS: Nonobstructed bowel-gas pattern with moderate stool in the colon. No radiopaque calculi. IMPRESSION: Nonobstructed gas pattern with moderate stool in the colon. Electronically Signed   By: Jasmine Pang M.D.   On: 10/23/2022 22:42    Procedures Procedures    Medications Ordered in ED Medications  iohexol (OMNIPAQUE) 300 MG/ML solution 100 mL (85 mLs Intravenous Contrast Given 10/23/22 2321)    ED Course/ Medical Decision Making/ A&P Clinical Course as of 10/24/22 1542  Thu Oct 23, 2022  2314 Signed out to Dr Judd Lien [RP]    Clinical Course User Index [RP] Rondel Baton, MD  Medical Decision Making Amount and/or Complexity of Data Reviewed Labs: ordered. Radiology: ordered.  Risk OTC drugs. Prescription drug management.   Angela [name] is a 82 y.o. female with comorbidities that complicate the patient evaluation including prior hypertension, Merkel cell carcinoma, and nasal cancer status post resection who presents to the emergency department with constipation.   Initial Ddx:  Constipation, bowel obstruction, ileus, diverticulitis, malignancy  MDM/Course:  Patient presents emergency department 6 days of constipation.  Still is passing gas so feel the bowel obstruction is less likely.  No significant distention or tenderness to palpation on exam.  Did have a PET CT scan that showed some enhancement of her colon recently that was concerning for possible  diverticulitis.  Feel this is less likely since patient is not having symptoms at this time.  Could potentially reflect metastasis to her colon or primary colon cancer so we will obtain a CT scan at this time.  Upon re-evaluation patient remained stable.  Signed out to the oncoming physician awaiting CT results.  Noted to be hypertensive in the emergency department but does have a history of whitecoat hypertension per her cardiology notes.  Was previously on antihypertensives but was taken off due to good blood pressure control.  Counseled to check her blood pressure at home to see if she needs to be restarted on antihypertensives.  This patient presents to the ED for concern of complaints listed in HPI, this involves an extensive number of treatment options, and is a complaint that carries with it a high risk of complications and morbidity. Disposition including potential need for admission considered.   Dispo: Pending remainder of workup  Additional history obtained from spouse Records reviewed Outpatient Clinic Notes The following labs were independently interpreted: Chemistry and show no acute abnormality I have reviewed the patients home medications and made adjustments as needed Social Determinants of health:  Elderly  Portions of this note were generated with Scientist, clinical (histocompatibility and immunogenetics). Dictation errors may occur despite best attempts at proofreading.           Final Clinical Impression(s) / ED Diagnoses Final diagnoses:  Constipation, unspecified constipation type    Rx / DC Orders ED Discharge Orders          Ordered    polyethylene glycol (MIRALAX) 17 g packet  Daily        10/23/22 2318    bisacodyl (DULCOLAX) 10 MG suppository  As needed        10/23/22 2318              Rondel Baton, MD 10/24/22 1542

## 2022-10-23 NOTE — ED Triage Notes (Signed)
  Patient comes in with constipation that has been going on for 6 days.  Patient states she has not had a solid BM since and only passed a few pieces of stool yesterday.  Denies any N/V, or abdominal pain.  Patient states she has been able to eat fine but concerned because nothing has come out.  Has tried a couple doses of miralax with no relief.  No pain at this time.

## 2022-10-23 NOTE — ED Notes (Signed)
Patient transported to CT 

## 2022-10-23 NOTE — Discharge Instructions (Addendum)
You were seen for your constipation in the emergency department.   Magnesium citrate: Drink the entire 10 ounce bottle for relief of constipation.  Check your MyChart online for the results of any tests that had not resulted by the time you left the emergency department.   Follow-up with your primary doctor in 2-3 days regarding your visit.    Return immediately to the emergency department if you experience any of the following: worsening pain, vomiting, or any other concerning symptoms.    Thank you for visiting our Emergency Department. It was a pleasure taking care of you today.

## 2022-10-24 DIAGNOSIS — C4A9 Merkel cell carcinoma, unspecified: Secondary | ICD-10-CM | POA: Diagnosis not present

## 2022-10-24 DIAGNOSIS — C7B1 Secondary Merkel cell carcinoma: Secondary | ICD-10-CM | POA: Diagnosis not present

## 2022-10-24 NOTE — ED Provider Notes (Signed)
  Physical Exam  BP (!) 169/59   Pulse 80   Temp 98.1 F (36.7 C) (Oral)   Resp 19   Ht 5\' 6"  (1.676 m)   Wt 64.4 kg   LMP 01/13/1993   SpO2 100%   BMI 22.92 kg/m   Physical Exam Vitals and nursing note reviewed.  Constitutional:      Appearance: Normal appearance.  Pulmonary:     Effort: Pulmonary effort is normal.  Neurological:     Mental Status: She is alert and oriented to person, place, and time.     Procedures  Procedures  ED Course / MDM   Clinical Course as of 10/24/22 0108  Thu Oct 23, 2022  2314 Signed out to Dr Judd Lien [RP]    Clinical Course User Index [RP] Rondel Baton, MD   Medical Decision Making Amount and/or Complexity of Data Reviewed Labs: ordered. Radiology: ordered.  Risk OTC drugs. Prescription drug management.   Care assumed from Dr. Eloise Harman at shift change.  Patient awaiting results of a CT scan of the abdomen and pelvis.  She initially presented here with constipation.  CT scan has resulted and shows stool throughout the large intestine, but no fecal impaction.  Patient will be treated with magnesium citrate and is to follow-up as needed.       Geoffery Lyons, MD 10/24/22 [phone]

## 2022-10-24 NOTE — ED Notes (Signed)
 RN reviewed discharge instructions with pt. Pt verbalized understanding and had no further questions. VSS upon discharge.  

## 2022-11-03 DIAGNOSIS — C4A9 Merkel cell carcinoma, unspecified: Secondary | ICD-10-CM | POA: Diagnosis not present

## 2022-11-03 DIAGNOSIS — F419 Anxiety disorder, unspecified: Secondary | ICD-10-CM | POA: Diagnosis not present

## 2022-11-06 DIAGNOSIS — C7B1 Secondary Merkel cell carcinoma: Secondary | ICD-10-CM | POA: Diagnosis not present

## 2022-11-06 DIAGNOSIS — H9192 Unspecified hearing loss, left ear: Secondary | ICD-10-CM | POA: Diagnosis not present

## 2022-11-06 DIAGNOSIS — C4A31 Merkel cell carcinoma of nose: Secondary | ICD-10-CM | POA: Diagnosis not present

## 2022-11-06 DIAGNOSIS — C4A9 Merkel cell carcinoma, unspecified: Secondary | ICD-10-CM | POA: Diagnosis not present

## 2022-11-17 DIAGNOSIS — J069 Acute upper respiratory infection, unspecified: Secondary | ICD-10-CM | POA: Diagnosis not present

## 2022-11-17 DIAGNOSIS — R051 Acute cough: Secondary | ICD-10-CM | POA: Diagnosis not present

## 2022-11-24 DIAGNOSIS — C7B1 Secondary Merkel cell carcinoma: Secondary | ICD-10-CM | POA: Diagnosis not present

## 2022-11-24 DIAGNOSIS — C4A9 Merkel cell carcinoma, unspecified: Secondary | ICD-10-CM | POA: Diagnosis not present

## 2022-11-27 DIAGNOSIS — Z9889 Other specified postprocedural states: Secondary | ICD-10-CM | POA: Diagnosis not present

## 2022-11-27 DIAGNOSIS — C4A31 Merkel cell carcinoma of nose: Secondary | ICD-10-CM | POA: Diagnosis not present

## 2022-11-27 DIAGNOSIS — C7B1 Secondary Merkel cell carcinoma: Secondary | ICD-10-CM | POA: Diagnosis not present

## 2022-11-27 DIAGNOSIS — Z9009 Acquired absence of other part of head and neck: Secondary | ICD-10-CM | POA: Diagnosis not present

## 2022-11-27 DIAGNOSIS — C4A9 Merkel cell carcinoma, unspecified: Secondary | ICD-10-CM | POA: Diagnosis not present

## 2022-12-02 DIAGNOSIS — C7B1 Secondary Merkel cell carcinoma: Secondary | ICD-10-CM | POA: Diagnosis not present

## 2022-12-02 DIAGNOSIS — Z5111 Encounter for antineoplastic chemotherapy: Secondary | ICD-10-CM | POA: Diagnosis not present

## 2022-12-02 DIAGNOSIS — C4A9 Merkel cell carcinoma, unspecified: Secondary | ICD-10-CM | POA: Diagnosis not present

## 2022-12-03 DIAGNOSIS — C4A9 Merkel cell carcinoma, unspecified: Secondary | ICD-10-CM | POA: Diagnosis not present

## 2022-12-03 DIAGNOSIS — R1312 Dysphagia, oropharyngeal phase: Secondary | ICD-10-CM | POA: Diagnosis not present

## 2022-12-15 DIAGNOSIS — Z510 Encounter for antineoplastic radiation therapy: Secondary | ICD-10-CM | POA: Diagnosis not present

## 2022-12-15 DIAGNOSIS — C4A31 Merkel cell carcinoma of nose: Secondary | ICD-10-CM | POA: Diagnosis not present

## 2022-12-15 DIAGNOSIS — C300 Malignant neoplasm of nasal cavity: Secondary | ICD-10-CM | POA: Diagnosis not present

## 2022-12-19 ENCOUNTER — Encounter: Payer: Self-pay | Admitting: Cardiology

## 2022-12-19 ENCOUNTER — Ambulatory Visit: Payer: Medicare Other | Attending: Cardiology | Admitting: Cardiology

## 2022-12-19 VITALS — BP 140/68 | HR 65 | Resp 16 | Ht 66.0 in | Wt 140.0 lb

## 2022-12-19 DIAGNOSIS — E782 Mixed hyperlipidemia: Secondary | ICD-10-CM | POA: Diagnosis not present

## 2022-12-19 DIAGNOSIS — I351 Nonrheumatic aortic (valve) insufficiency: Secondary | ICD-10-CM | POA: Diagnosis not present

## 2022-12-19 DIAGNOSIS — Z79899 Other long term (current) drug therapy: Secondary | ICD-10-CM

## 2022-12-19 NOTE — Patient Instructions (Signed)
Medication Instructions:  Your physician recommends that you continue on your current medications as directed. Please refer to the Current Medication list given to you today.  *If you need a refill on your cardiac medications before your next appointment, please call your pharmacy*   Lab Work: Please complete a lipid panel in December of 2025. You can complete this at any [facility].  If you have labs (blood work) drawn today and your tests are completely normal, you will receive your results only by: MyChart Message (if you have MyChart) OR A paper copy in the mail If you have any lab test that is abnormal or we need to change your treatment, we will call you to review the results.   Testing/Procedures: Your physician has requested that you have an echocardiogram in December 2025. Echocardiography is a painless test that uses sound waves to create images of your heart. It provides your doctor with information about the size and shape of your heart and how well your heart's chambers and valves are working. This procedure takes approximately one hour. There are no restrictions for this procedure. Please do NOT wear cologne, perfume, aftershave, or lotions (deodorant is allowed). Please arrive 15 minutes prior to your appointment time.  Please note: We ask at that you not bring children with you during ultrasound (echo/ vascular) testing. Due to room size and safety concerns, children are not allowed in the ultrasound rooms during exams. Our front office staff cannot provide observation of children in our lobby area while testing is being conducted. An adult accompanying a patient to their appointment will only be allowed in the ultrasound room at the discretion of the ultrasound technician under special circumstances. We apologize for any inconvenience.    Follow-Up:  Your next appointment:   1 year(s) (January 2026, after Echo and lipid labs have been completed in December 2025.)  Provider:    Dr. Judie Petit. Rosemary Holms, MD

## 2022-12-19 NOTE — Progress Notes (Signed)
  Cardiology Office Note:  .   Date:  12/19/2022  ID:  Angela [name], DOB [birth year], MRN [id number] PCP: Sigmund Hazel, MD  [facility] Providers Cardiologist:  Truett Mainland, MD PCP: Sigmund Hazel, MD  Chief Complaint  Patient presents with   Mixed hyperlipidemia   Follow-up      History of Present Illness: .    Angela [name] is a 82 y.o. female with hypertension, hyperlipidemia, coronary atherosclerosis, [name]   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  Subsequently, she has undergone removal of the cancerous growth, and is undergoing radiation.  Subsequently she will undergo reconstruction surgery for her nose.  From cardiac standpoint, she denies any new symptoms or complaints.  Blood pressure slightly elevated today in the office, as usual.  However, home blood pressure readings are very within normal limits.  She is compliant with her medical therapy-Crestor 20 mg twice weekly.  Vitals:   12/19/22 1115  BP: (!) 140/68  Pulse: 65  Resp: 16  SpO2: 98%     ROS:  Review of Systems  Cardiovascular:  Negative for chest pain, dyspnea on exertion, leg swelling, palpitations and syncope.     Studies Reviewed: Marland Kitchen        Independently interpreted 04/2022: Chol 158, TG 64, HDL 76, LDL 69 Hb 13.3 Cr 0.7   Physical Exam:   Physical Exam Vitals and nursing note reviewed.  Constitutional:      General: She is not in acute distress. Neck:     Vascular: No JVD.  Cardiovascular:     Rate and Rhythm: Normal rate and regular rhythm.     Heart sounds: Normal heart sounds. No murmur heard. Pulmonary:     Effort: Pulmonary effort is normal.     Breath sounds: Normal breath sounds. No wheezing or rales.  Musculoskeletal:     Right lower leg: No edema.     Left lower leg: No edema.      VISIT DIAGNOSES:   ICD-10-CM   1. Mixed hyperlipidemia  E78.2 Lipid panel    Lipid panel    2.  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 Medication management  Z79.899 Lipid panel    Lipid panel       ASSESSMENT AND PLAN: .    Angela [name] is a 82 y.o. female with hypertension, hyperlipidemia, coronary atherosclerosis, [name]   Coronary artery calcification: No angina symptoms. Nuclear stress test with no iscehmia, infarction (09/2017). Continue risk factor modification. Okay not to be on Aspirin in absence of obstructive CAD Statin intolerance to several statins, but able to tolerate rosuvastatin 10 mgX2 pills twice weekly. Chol 158, TG 64, HDL 76, LDL 69 (04/2022). Continue the same. Continue In the past, PCSK9 inhibitor was not approved by insurance.    Hypertension: Known component of whitecoat hypertension. Continue to hold antihypertensive therapy, unless SBP is consistently >140 mmHg.  At that time, could consider adding losartan-hydrochlorothiazide.     Mild AI, MR: Mild AI, mild to moderate MR, mild TR (echocardiogram 05/2022). Other than slightly wide pulse pressure, she is asymptomatic. Repeat echocardiogram in 01/2024.  I do not foresee any significant perioperative cardiovascular challenges with upcoming radiation as well as reconstruction surgery for her nose bridge.       F/u in 1 year  Signed, [name], MD

## 2022-12-22 DIAGNOSIS — C4A31 Merkel cell carcinoma of nose: Secondary | ICD-10-CM | POA: Diagnosis not present

## 2022-12-22 DIAGNOSIS — C300 Malignant neoplasm of nasal cavity: Secondary | ICD-10-CM | POA: Diagnosis not present

## 2022-12-22 DIAGNOSIS — Z510 Encounter for antineoplastic radiation therapy: Secondary | ICD-10-CM | POA: Diagnosis not present

## 2022-12-23 DIAGNOSIS — C300 Malignant neoplasm of nasal cavity: Secondary | ICD-10-CM | POA: Diagnosis not present

## 2022-12-23 DIAGNOSIS — C4A31 Merkel cell carcinoma of nose: Secondary | ICD-10-CM | POA: Diagnosis not present

## 2022-12-23 DIAGNOSIS — Z510 Encounter for antineoplastic radiation therapy: Secondary | ICD-10-CM | POA: Diagnosis not present

## 2022-12-26 DIAGNOSIS — Z6822 Body mass index (BMI) 22.0-22.9, adult: Secondary | ICD-10-CM | POA: Diagnosis not present

## 2022-12-26 DIAGNOSIS — M353 Polymyalgia rheumatica: Secondary | ICD-10-CM | POA: Diagnosis not present

## 2022-12-26 DIAGNOSIS — M8000XD Age-related osteoporosis with current pathological fracture, unspecified site, subsequent encounter for fracture with routine healing: Secondary | ICD-10-CM | POA: Diagnosis not present

## 2022-12-26 DIAGNOSIS — M1991 Primary osteoarthritis, unspecified site: Secondary | ICD-10-CM | POA: Diagnosis not present

## 2022-12-27 ENCOUNTER — Other Ambulatory Visit: Payer: Self-pay | Admitting: Cardiology

## 2022-12-27 DIAGNOSIS — E782 Mixed hyperlipidemia: Secondary | ICD-10-CM

## 2022-12-29 DIAGNOSIS — Z510 Encounter for antineoplastic radiation therapy: Secondary | ICD-10-CM | POA: Diagnosis not present

## 2022-12-29 DIAGNOSIS — R1312 Dysphagia, oropharyngeal phase: Secondary | ICD-10-CM | POA: Diagnosis not present

## 2022-12-29 DIAGNOSIS — C4A9 Merkel cell carcinoma, unspecified: Secondary | ICD-10-CM | POA: Diagnosis not present

## 2022-12-29 DIAGNOSIS — C300 Malignant neoplasm of nasal cavity: Secondary | ICD-10-CM | POA: Diagnosis not present

## 2022-12-29 DIAGNOSIS — C4A31 Merkel cell carcinoma of nose: Secondary | ICD-10-CM | POA: Diagnosis not present

## 2022-12-30 DIAGNOSIS — C300 Malignant neoplasm of nasal cavity: Secondary | ICD-10-CM | POA: Diagnosis not present

## 2022-12-30 DIAGNOSIS — C4A31 Merkel cell carcinoma of nose: Secondary | ICD-10-CM | POA: Diagnosis not present

## 2022-12-30 DIAGNOSIS — Z510 Encounter for antineoplastic radiation therapy: Secondary | ICD-10-CM | POA: Diagnosis not present

## 2022-12-31 DIAGNOSIS — Z510 Encounter for antineoplastic radiation therapy: Secondary | ICD-10-CM | POA: Diagnosis not present

## 2022-12-31 DIAGNOSIS — C4A31 Merkel cell carcinoma of nose: Secondary | ICD-10-CM | POA: Diagnosis not present

## 2022-12-31 DIAGNOSIS — C300 Malignant neoplasm of nasal cavity: Secondary | ICD-10-CM | POA: Diagnosis not present

## 2023-01-01 DIAGNOSIS — Z510 Encounter for antineoplastic radiation therapy: Secondary | ICD-10-CM | POA: Diagnosis not present

## 2023-01-01 DIAGNOSIS — C300 Malignant neoplasm of nasal cavity: Secondary | ICD-10-CM | POA: Diagnosis not present

## 2023-01-01 DIAGNOSIS — C4A31 Merkel cell carcinoma of nose: Secondary | ICD-10-CM | POA: Diagnosis not present

## 2023-01-02 DIAGNOSIS — C300 Malignant neoplasm of nasal cavity: Secondary | ICD-10-CM | POA: Diagnosis not present

## 2023-01-02 DIAGNOSIS — C4A31 Merkel cell carcinoma of nose: Secondary | ICD-10-CM | POA: Diagnosis not present

## 2023-01-02 DIAGNOSIS — Z510 Encounter for antineoplastic radiation therapy: Secondary | ICD-10-CM | POA: Diagnosis not present

## 2023-01-05 DIAGNOSIS — C300 Malignant neoplasm of nasal cavity: Secondary | ICD-10-CM | POA: Diagnosis not present

## 2023-01-05 DIAGNOSIS — F419 Anxiety disorder, unspecified: Secondary | ICD-10-CM | POA: Diagnosis not present

## 2023-01-05 DIAGNOSIS — C4A31 Merkel cell carcinoma of nose: Secondary | ICD-10-CM | POA: Diagnosis not present

## 2023-01-05 DIAGNOSIS — Z510 Encounter for antineoplastic radiation therapy: Secondary | ICD-10-CM | POA: Diagnosis not present

## 2023-01-05 DIAGNOSIS — C4A9 Merkel cell carcinoma, unspecified: Secondary | ICD-10-CM | POA: Diagnosis not present

## 2023-01-06 DIAGNOSIS — C300 Malignant neoplasm of nasal cavity: Secondary | ICD-10-CM | POA: Diagnosis not present

## 2023-01-06 DIAGNOSIS — C4A31 Merkel cell carcinoma of nose: Secondary | ICD-10-CM | POA: Diagnosis not present

## 2023-01-06 DIAGNOSIS — Z510 Encounter for antineoplastic radiation therapy: Secondary | ICD-10-CM | POA: Diagnosis not present

## 2023-01-08 DIAGNOSIS — C300 Malignant neoplasm of nasal cavity: Secondary | ICD-10-CM | POA: Diagnosis not present

## 2023-01-08 DIAGNOSIS — C4A31 Merkel cell carcinoma of nose: Secondary | ICD-10-CM | POA: Diagnosis not present

## 2023-01-08 DIAGNOSIS — Z510 Encounter for antineoplastic radiation therapy: Secondary | ICD-10-CM | POA: Diagnosis not present

## 2023-01-09 DIAGNOSIS — C4A31 Merkel cell carcinoma of nose: Secondary | ICD-10-CM | POA: Diagnosis not present

## 2023-01-09 DIAGNOSIS — Z510 Encounter for antineoplastic radiation therapy: Secondary | ICD-10-CM | POA: Diagnosis not present

## 2023-01-09 DIAGNOSIS — C300 Malignant neoplasm of nasal cavity: Secondary | ICD-10-CM | POA: Diagnosis not present

## 2023-01-12 DIAGNOSIS — C4A31 Merkel cell carcinoma of nose: Secondary | ICD-10-CM | POA: Diagnosis not present

## 2023-01-12 DIAGNOSIS — Z510 Encounter for antineoplastic radiation therapy: Secondary | ICD-10-CM | POA: Diagnosis not present

## 2023-01-12 DIAGNOSIS — C300 Malignant neoplasm of nasal cavity: Secondary | ICD-10-CM | POA: Diagnosis not present

## 2023-01-13 DIAGNOSIS — C4A31 Merkel cell carcinoma of nose: Secondary | ICD-10-CM | POA: Diagnosis not present

## 2023-01-13 DIAGNOSIS — Z510 Encounter for antineoplastic radiation therapy: Secondary | ICD-10-CM | POA: Diagnosis not present

## 2023-01-13 DIAGNOSIS — C300 Malignant neoplasm of nasal cavity: Secondary | ICD-10-CM | POA: Diagnosis not present

## 2023-01-15 DIAGNOSIS — J3489 Other specified disorders of nose and nasal sinuses: Secondary | ICD-10-CM | POA: Diagnosis not present

## 2023-01-15 DIAGNOSIS — R1312 Dysphagia, oropharyngeal phase: Secondary | ICD-10-CM | POA: Diagnosis not present

## 2023-01-15 DIAGNOSIS — K117 Disturbances of salivary secretion: Secondary | ICD-10-CM | POA: Diagnosis not present

## 2023-01-15 DIAGNOSIS — H9190 Unspecified hearing loss, unspecified ear: Secondary | ICD-10-CM | POA: Diagnosis not present

## 2023-01-15 DIAGNOSIS — Z9009 Acquired absence of other part of head and neck: Secondary | ICD-10-CM | POA: Diagnosis not present

## 2023-01-15 DIAGNOSIS — C4A31 Merkel cell carcinoma of nose: Secondary | ICD-10-CM | POA: Diagnosis not present

## 2023-01-15 DIAGNOSIS — C300 Malignant neoplasm of nasal cavity: Secondary | ICD-10-CM | POA: Diagnosis not present

## 2023-01-15 DIAGNOSIS — Z510 Encounter for antineoplastic radiation therapy: Secondary | ICD-10-CM | POA: Diagnosis not present

## 2023-01-16 DIAGNOSIS — K117 Disturbances of salivary secretion: Secondary | ICD-10-CM | POA: Diagnosis not present

## 2023-01-16 DIAGNOSIS — Z510 Encounter for antineoplastic radiation therapy: Secondary | ICD-10-CM | POA: Diagnosis not present

## 2023-01-16 DIAGNOSIS — C300 Malignant neoplasm of nasal cavity: Secondary | ICD-10-CM | POA: Diagnosis not present

## 2023-01-16 DIAGNOSIS — Z9009 Acquired absence of other part of head and neck: Secondary | ICD-10-CM | POA: Diagnosis not present

## 2023-01-16 DIAGNOSIS — C4A9 Merkel cell carcinoma, unspecified: Secondary | ICD-10-CM | POA: Diagnosis not present

## 2023-01-16 DIAGNOSIS — C4A31 Merkel cell carcinoma of nose: Secondary | ICD-10-CM | POA: Diagnosis not present

## 2023-01-16 DIAGNOSIS — H9190 Unspecified hearing loss, unspecified ear: Secondary | ICD-10-CM | POA: Diagnosis not present

## 2023-01-16 DIAGNOSIS — J3489 Other specified disorders of nose and nasal sinuses: Secondary | ICD-10-CM | POA: Diagnosis not present

## 2023-01-19 DIAGNOSIS — J3489 Other specified disorders of nose and nasal sinuses: Secondary | ICD-10-CM | POA: Diagnosis not present

## 2023-01-19 DIAGNOSIS — C4A9 Merkel cell carcinoma, unspecified: Secondary | ICD-10-CM | POA: Diagnosis not present

## 2023-01-19 DIAGNOSIS — C300 Malignant neoplasm of nasal cavity: Secondary | ICD-10-CM | POA: Diagnosis not present

## 2023-01-19 DIAGNOSIS — H9190 Unspecified hearing loss, unspecified ear: Secondary | ICD-10-CM | POA: Diagnosis not present

## 2023-01-19 DIAGNOSIS — Z510 Encounter for antineoplastic radiation therapy: Secondary | ICD-10-CM | POA: Diagnosis not present

## 2023-01-19 DIAGNOSIS — C4A31 Merkel cell carcinoma of nose: Secondary | ICD-10-CM | POA: Diagnosis not present

## 2023-01-19 DIAGNOSIS — Z9009 Acquired absence of other part of head and neck: Secondary | ICD-10-CM | POA: Diagnosis not present

## 2023-01-19 DIAGNOSIS — K117 Disturbances of salivary secretion: Secondary | ICD-10-CM | POA: Diagnosis not present

## 2023-01-20 DIAGNOSIS — C4A31 Merkel cell carcinoma of nose: Secondary | ICD-10-CM | POA: Diagnosis not present

## 2023-01-20 DIAGNOSIS — Z9009 Acquired absence of other part of head and neck: Secondary | ICD-10-CM | POA: Diagnosis not present

## 2023-01-20 DIAGNOSIS — H9190 Unspecified hearing loss, unspecified ear: Secondary | ICD-10-CM | POA: Diagnosis not present

## 2023-01-20 DIAGNOSIS — C300 Malignant neoplasm of nasal cavity: Secondary | ICD-10-CM | POA: Diagnosis not present

## 2023-01-20 DIAGNOSIS — Z510 Encounter for antineoplastic radiation therapy: Secondary | ICD-10-CM | POA: Diagnosis not present

## 2023-01-20 DIAGNOSIS — K117 Disturbances of salivary secretion: Secondary | ICD-10-CM | POA: Diagnosis not present

## 2023-01-20 DIAGNOSIS — J3489 Other specified disorders of nose and nasal sinuses: Secondary | ICD-10-CM | POA: Diagnosis not present

## 2023-01-21 DIAGNOSIS — C4A31 Merkel cell carcinoma of nose: Secondary | ICD-10-CM | POA: Diagnosis not present

## 2023-01-21 DIAGNOSIS — Z9009 Acquired absence of other part of head and neck: Secondary | ICD-10-CM | POA: Diagnosis not present

## 2023-01-21 DIAGNOSIS — K117 Disturbances of salivary secretion: Secondary | ICD-10-CM | POA: Diagnosis not present

## 2023-01-21 DIAGNOSIS — C300 Malignant neoplasm of nasal cavity: Secondary | ICD-10-CM | POA: Diagnosis not present

## 2023-01-21 DIAGNOSIS — Z510 Encounter for antineoplastic radiation therapy: Secondary | ICD-10-CM | POA: Diagnosis not present

## 2023-01-21 DIAGNOSIS — J3489 Other specified disorders of nose and nasal sinuses: Secondary | ICD-10-CM | POA: Diagnosis not present

## 2023-01-21 DIAGNOSIS — H9190 Unspecified hearing loss, unspecified ear: Secondary | ICD-10-CM | POA: Diagnosis not present

## 2023-01-22 DIAGNOSIS — K117 Disturbances of salivary secretion: Secondary | ICD-10-CM | POA: Diagnosis not present

## 2023-01-22 DIAGNOSIS — J3489 Other specified disorders of nose and nasal sinuses: Secondary | ICD-10-CM | POA: Diagnosis not present

## 2023-01-22 DIAGNOSIS — C4A31 Merkel cell carcinoma of nose: Secondary | ICD-10-CM | POA: Diagnosis not present

## 2023-01-22 DIAGNOSIS — Z9009 Acquired absence of other part of head and neck: Secondary | ICD-10-CM | POA: Diagnosis not present

## 2023-01-22 DIAGNOSIS — C300 Malignant neoplasm of nasal cavity: Secondary | ICD-10-CM | POA: Diagnosis not present

## 2023-01-22 DIAGNOSIS — Z510 Encounter for antineoplastic radiation therapy: Secondary | ICD-10-CM | POA: Diagnosis not present

## 2023-01-22 DIAGNOSIS — H9190 Unspecified hearing loss, unspecified ear: Secondary | ICD-10-CM | POA: Diagnosis not present

## 2023-01-23 DIAGNOSIS — Z510 Encounter for antineoplastic radiation therapy: Secondary | ICD-10-CM | POA: Diagnosis not present

## 2023-01-23 DIAGNOSIS — C4A31 Merkel cell carcinoma of nose: Secondary | ICD-10-CM | POA: Diagnosis not present

## 2023-01-23 DIAGNOSIS — Z9009 Acquired absence of other part of head and neck: Secondary | ICD-10-CM | POA: Diagnosis not present

## 2023-01-23 DIAGNOSIS — J3489 Other specified disorders of nose and nasal sinuses: Secondary | ICD-10-CM | POA: Diagnosis not present

## 2023-01-23 DIAGNOSIS — K117 Disturbances of salivary secretion: Secondary | ICD-10-CM | POA: Diagnosis not present

## 2023-01-23 DIAGNOSIS — C300 Malignant neoplasm of nasal cavity: Secondary | ICD-10-CM | POA: Diagnosis not present

## 2023-01-23 DIAGNOSIS — H9190 Unspecified hearing loss, unspecified ear: Secondary | ICD-10-CM | POA: Diagnosis not present

## 2023-01-26 DIAGNOSIS — C4A9 Merkel cell carcinoma, unspecified: Secondary | ICD-10-CM | POA: Diagnosis not present

## 2023-01-26 DIAGNOSIS — C300 Malignant neoplasm of nasal cavity: Secondary | ICD-10-CM | POA: Diagnosis not present

## 2023-01-26 DIAGNOSIS — Z510 Encounter for antineoplastic radiation therapy: Secondary | ICD-10-CM | POA: Diagnosis not present

## 2023-01-26 DIAGNOSIS — J3489 Other specified disorders of nose and nasal sinuses: Secondary | ICD-10-CM | POA: Diagnosis not present

## 2023-01-26 DIAGNOSIS — K117 Disturbances of salivary secretion: Secondary | ICD-10-CM | POA: Diagnosis not present

## 2023-01-26 DIAGNOSIS — C4A31 Merkel cell carcinoma of nose: Secondary | ICD-10-CM | POA: Diagnosis not present

## 2023-01-26 DIAGNOSIS — Z9009 Acquired absence of other part of head and neck: Secondary | ICD-10-CM | POA: Diagnosis not present

## 2023-01-26 DIAGNOSIS — C7B1 Secondary Merkel cell carcinoma: Secondary | ICD-10-CM | POA: Diagnosis not present

## 2023-01-26 DIAGNOSIS — H9190 Unspecified hearing loss, unspecified ear: Secondary | ICD-10-CM | POA: Diagnosis not present

## 2023-01-27 DIAGNOSIS — C4A31 Merkel cell carcinoma of nose: Secondary | ICD-10-CM | POA: Diagnosis not present

## 2023-01-27 DIAGNOSIS — J3489 Other specified disorders of nose and nasal sinuses: Secondary | ICD-10-CM | POA: Diagnosis not present

## 2023-01-27 DIAGNOSIS — Z9009 Acquired absence of other part of head and neck: Secondary | ICD-10-CM | POA: Diagnosis not present

## 2023-01-27 DIAGNOSIS — Z510 Encounter for antineoplastic radiation therapy: Secondary | ICD-10-CM | POA: Diagnosis not present

## 2023-01-27 DIAGNOSIS — K117 Disturbances of salivary secretion: Secondary | ICD-10-CM | POA: Diagnosis not present

## 2023-01-27 DIAGNOSIS — H9190 Unspecified hearing loss, unspecified ear: Secondary | ICD-10-CM | POA: Diagnosis not present

## 2023-01-27 DIAGNOSIS — C300 Malignant neoplasm of nasal cavity: Secondary | ICD-10-CM | POA: Diagnosis not present

## 2023-01-28 DIAGNOSIS — J3489 Other specified disorders of nose and nasal sinuses: Secondary | ICD-10-CM | POA: Diagnosis not present

## 2023-01-28 DIAGNOSIS — C300 Malignant neoplasm of nasal cavity: Secondary | ICD-10-CM | POA: Diagnosis not present

## 2023-01-28 DIAGNOSIS — K117 Disturbances of salivary secretion: Secondary | ICD-10-CM | POA: Diagnosis not present

## 2023-01-28 DIAGNOSIS — H9190 Unspecified hearing loss, unspecified ear: Secondary | ICD-10-CM | POA: Diagnosis not present

## 2023-01-28 DIAGNOSIS — R1310 Dysphagia, unspecified: Secondary | ICD-10-CM | POA: Diagnosis not present

## 2023-01-28 DIAGNOSIS — Z9009 Acquired absence of other part of head and neck: Secondary | ICD-10-CM | POA: Diagnosis not present

## 2023-01-28 DIAGNOSIS — Z510 Encounter for antineoplastic radiation therapy: Secondary | ICD-10-CM | POA: Diagnosis not present

## 2023-01-28 DIAGNOSIS — C4A31 Merkel cell carcinoma of nose: Secondary | ICD-10-CM | POA: Diagnosis not present

## 2023-01-28 DIAGNOSIS — R633 Feeding difficulties, unspecified: Secondary | ICD-10-CM | POA: Diagnosis not present

## 2023-01-29 DIAGNOSIS — C300 Malignant neoplasm of nasal cavity: Secondary | ICD-10-CM | POA: Diagnosis not present

## 2023-01-29 DIAGNOSIS — Z9009 Acquired absence of other part of head and neck: Secondary | ICD-10-CM | POA: Diagnosis not present

## 2023-01-29 DIAGNOSIS — J3489 Other specified disorders of nose and nasal sinuses: Secondary | ICD-10-CM | POA: Diagnosis not present

## 2023-01-29 DIAGNOSIS — Z510 Encounter for antineoplastic radiation therapy: Secondary | ICD-10-CM | POA: Diagnosis not present

## 2023-01-29 DIAGNOSIS — H9190 Unspecified hearing loss, unspecified ear: Secondary | ICD-10-CM | POA: Diagnosis not present

## 2023-01-29 DIAGNOSIS — C4A31 Merkel cell carcinoma of nose: Secondary | ICD-10-CM | POA: Diagnosis not present

## 2023-01-29 DIAGNOSIS — K117 Disturbances of salivary secretion: Secondary | ICD-10-CM | POA: Diagnosis not present

## 2023-01-30 DIAGNOSIS — Z9009 Acquired absence of other part of head and neck: Secondary | ICD-10-CM | POA: Diagnosis not present

## 2023-01-30 DIAGNOSIS — J3489 Other specified disorders of nose and nasal sinuses: Secondary | ICD-10-CM | POA: Diagnosis not present

## 2023-01-30 DIAGNOSIS — H9190 Unspecified hearing loss, unspecified ear: Secondary | ICD-10-CM | POA: Diagnosis not present

## 2023-01-30 DIAGNOSIS — K117 Disturbances of salivary secretion: Secondary | ICD-10-CM | POA: Diagnosis not present

## 2023-01-30 DIAGNOSIS — Z510 Encounter for antineoplastic radiation therapy: Secondary | ICD-10-CM | POA: Diagnosis not present

## 2023-01-30 DIAGNOSIS — C300 Malignant neoplasm of nasal cavity: Secondary | ICD-10-CM | POA: Diagnosis not present

## 2023-01-30 DIAGNOSIS — C4A31 Merkel cell carcinoma of nose: Secondary | ICD-10-CM | POA: Diagnosis not present

## 2023-02-03 DIAGNOSIS — C300 Malignant neoplasm of nasal cavity: Secondary | ICD-10-CM | POA: Diagnosis not present

## 2023-02-03 DIAGNOSIS — K117 Disturbances of salivary secretion: Secondary | ICD-10-CM | POA: Diagnosis not present

## 2023-02-03 DIAGNOSIS — H9190 Unspecified hearing loss, unspecified ear: Secondary | ICD-10-CM | POA: Diagnosis not present

## 2023-02-03 DIAGNOSIS — Z510 Encounter for antineoplastic radiation therapy: Secondary | ICD-10-CM | POA: Diagnosis not present

## 2023-02-03 DIAGNOSIS — J3489 Other specified disorders of nose and nasal sinuses: Secondary | ICD-10-CM | POA: Diagnosis not present

## 2023-02-03 DIAGNOSIS — C4A31 Merkel cell carcinoma of nose: Secondary | ICD-10-CM | POA: Diagnosis not present

## 2023-02-03 DIAGNOSIS — Z9009 Acquired absence of other part of head and neck: Secondary | ICD-10-CM | POA: Diagnosis not present

## 2023-02-04 DIAGNOSIS — Z510 Encounter for antineoplastic radiation therapy: Secondary | ICD-10-CM | POA: Diagnosis not present

## 2023-02-04 DIAGNOSIS — C4A31 Merkel cell carcinoma of nose: Secondary | ICD-10-CM | POA: Diagnosis not present

## 2023-02-04 DIAGNOSIS — H9190 Unspecified hearing loss, unspecified ear: Secondary | ICD-10-CM | POA: Diagnosis not present

## 2023-02-04 DIAGNOSIS — J3489 Other specified disorders of nose and nasal sinuses: Secondary | ICD-10-CM | POA: Diagnosis not present

## 2023-02-04 DIAGNOSIS — C300 Malignant neoplasm of nasal cavity: Secondary | ICD-10-CM | POA: Diagnosis not present

## 2023-02-04 DIAGNOSIS — K117 Disturbances of salivary secretion: Secondary | ICD-10-CM | POA: Diagnosis not present

## 2023-02-04 DIAGNOSIS — Z9009 Acquired absence of other part of head and neck: Secondary | ICD-10-CM | POA: Diagnosis not present

## 2023-02-05 DIAGNOSIS — K117 Disturbances of salivary secretion: Secondary | ICD-10-CM | POA: Diagnosis not present

## 2023-02-05 DIAGNOSIS — J3489 Other specified disorders of nose and nasal sinuses: Secondary | ICD-10-CM | POA: Diagnosis not present

## 2023-02-05 DIAGNOSIS — Z9009 Acquired absence of other part of head and neck: Secondary | ICD-10-CM | POA: Diagnosis not present

## 2023-02-05 DIAGNOSIS — C300 Malignant neoplasm of nasal cavity: Secondary | ICD-10-CM | POA: Diagnosis not present

## 2023-02-05 DIAGNOSIS — H9190 Unspecified hearing loss, unspecified ear: Secondary | ICD-10-CM | POA: Diagnosis not present

## 2023-02-05 DIAGNOSIS — C4A31 Merkel cell carcinoma of nose: Secondary | ICD-10-CM | POA: Diagnosis not present

## 2023-02-05 DIAGNOSIS — Z510 Encounter for antineoplastic radiation therapy: Secondary | ICD-10-CM | POA: Diagnosis not present

## 2023-02-06 DIAGNOSIS — Z9009 Acquired absence of other part of head and neck: Secondary | ICD-10-CM | POA: Diagnosis not present

## 2023-02-06 DIAGNOSIS — K117 Disturbances of salivary secretion: Secondary | ICD-10-CM | POA: Diagnosis not present

## 2023-02-06 DIAGNOSIS — C4A31 Merkel cell carcinoma of nose: Secondary | ICD-10-CM | POA: Diagnosis not present

## 2023-02-06 DIAGNOSIS — Z510 Encounter for antineoplastic radiation therapy: Secondary | ICD-10-CM | POA: Diagnosis not present

## 2023-02-06 DIAGNOSIS — J3489 Other specified disorders of nose and nasal sinuses: Secondary | ICD-10-CM | POA: Diagnosis not present

## 2023-02-06 DIAGNOSIS — H9190 Unspecified hearing loss, unspecified ear: Secondary | ICD-10-CM | POA: Diagnosis not present

## 2023-02-06 DIAGNOSIS — C300 Malignant neoplasm of nasal cavity: Secondary | ICD-10-CM | POA: Diagnosis not present

## 2023-02-09 DIAGNOSIS — H9190 Unspecified hearing loss, unspecified ear: Secondary | ICD-10-CM | POA: Diagnosis not present

## 2023-02-09 DIAGNOSIS — C300 Malignant neoplasm of nasal cavity: Secondary | ICD-10-CM | POA: Diagnosis not present

## 2023-02-09 DIAGNOSIS — C4A31 Merkel cell carcinoma of nose: Secondary | ICD-10-CM | POA: Diagnosis not present

## 2023-02-09 DIAGNOSIS — Z510 Encounter for antineoplastic radiation therapy: Secondary | ICD-10-CM | POA: Diagnosis not present

## 2023-02-09 DIAGNOSIS — J3489 Other specified disorders of nose and nasal sinuses: Secondary | ICD-10-CM | POA: Diagnosis not present

## 2023-02-09 DIAGNOSIS — K117 Disturbances of salivary secretion: Secondary | ICD-10-CM | POA: Diagnosis not present

## 2023-02-09 DIAGNOSIS — Z9009 Acquired absence of other part of head and neck: Secondary | ICD-10-CM | POA: Diagnosis not present

## 2023-02-10 DIAGNOSIS — C4A31 Merkel cell carcinoma of nose: Secondary | ICD-10-CM | POA: Diagnosis not present

## 2023-02-10 DIAGNOSIS — J3489 Other specified disorders of nose and nasal sinuses: Secondary | ICD-10-CM | POA: Diagnosis not present

## 2023-02-10 DIAGNOSIS — Z9009 Acquired absence of other part of head and neck: Secondary | ICD-10-CM | POA: Diagnosis not present

## 2023-02-10 DIAGNOSIS — C300 Malignant neoplasm of nasal cavity: Secondary | ICD-10-CM | POA: Diagnosis not present

## 2023-02-10 DIAGNOSIS — H9190 Unspecified hearing loss, unspecified ear: Secondary | ICD-10-CM | POA: Diagnosis not present

## 2023-02-10 DIAGNOSIS — K117 Disturbances of salivary secretion: Secondary | ICD-10-CM | POA: Diagnosis not present

## 2023-02-10 DIAGNOSIS — Z510 Encounter for antineoplastic radiation therapy: Secondary | ICD-10-CM | POA: Diagnosis not present

## 2023-02-11 DIAGNOSIS — C4A31 Merkel cell carcinoma of nose: Secondary | ICD-10-CM | POA: Diagnosis not present

## 2023-02-11 DIAGNOSIS — H9190 Unspecified hearing loss, unspecified ear: Secondary | ICD-10-CM | POA: Diagnosis not present

## 2023-02-11 DIAGNOSIS — Z9009 Acquired absence of other part of head and neck: Secondary | ICD-10-CM | POA: Diagnosis not present

## 2023-02-11 DIAGNOSIS — J3489 Other specified disorders of nose and nasal sinuses: Secondary | ICD-10-CM | POA: Diagnosis not present

## 2023-02-11 DIAGNOSIS — C300 Malignant neoplasm of nasal cavity: Secondary | ICD-10-CM | POA: Diagnosis not present

## 2023-02-11 DIAGNOSIS — Z510 Encounter for antineoplastic radiation therapy: Secondary | ICD-10-CM | POA: Diagnosis not present

## 2023-02-11 DIAGNOSIS — K117 Disturbances of salivary secretion: Secondary | ICD-10-CM | POA: Diagnosis not present

## 2023-02-23 DIAGNOSIS — F419 Anxiety disorder, unspecified: Secondary | ICD-10-CM | POA: Diagnosis not present

## 2023-02-23 DIAGNOSIS — C4A9 Merkel cell carcinoma, unspecified: Secondary | ICD-10-CM | POA: Diagnosis not present

## 2023-02-25 DIAGNOSIS — R1032 Left lower quadrant pain: Secondary | ICD-10-CM | POA: Diagnosis not present

## 2023-02-27 DIAGNOSIS — R1032 Left lower quadrant pain: Secondary | ICD-10-CM | POA: Diagnosis not present

## 2023-03-01 DIAGNOSIS — T7840XA Allergy, unspecified, initial encounter: Secondary | ICD-10-CM | POA: Diagnosis not present

## 2023-03-01 DIAGNOSIS — B370 Candidal stomatitis: Secondary | ICD-10-CM | POA: Diagnosis not present

## 2023-03-03 DIAGNOSIS — M950 Acquired deformity of nose: Secondary | ICD-10-CM | POA: Diagnosis not present

## 2023-03-03 DIAGNOSIS — Z9889 Other specified postprocedural states: Secondary | ICD-10-CM | POA: Diagnosis not present

## 2023-03-12 DIAGNOSIS — R9389 Abnormal findings on diagnostic imaging of other specified body structures: Secondary | ICD-10-CM | POA: Diagnosis not present

## 2023-03-12 DIAGNOSIS — C7B1 Secondary Merkel cell carcinoma: Secondary | ICD-10-CM | POA: Diagnosis not present

## 2023-03-12 DIAGNOSIS — C4A9 Merkel cell carcinoma, unspecified: Secondary | ICD-10-CM | POA: Diagnosis not present

## 2023-03-13 DIAGNOSIS — C4A31 Merkel cell carcinoma of nose: Secondary | ICD-10-CM | POA: Diagnosis not present

## 2023-03-13 DIAGNOSIS — Z9089 Acquired absence of other organs: Secondary | ICD-10-CM | POA: Diagnosis not present

## 2023-03-13 DIAGNOSIS — R21 Rash and other nonspecific skin eruption: Secondary | ICD-10-CM | POA: Diagnosis not present

## 2023-03-13 DIAGNOSIS — Z9889 Other specified postprocedural states: Secondary | ICD-10-CM | POA: Diagnosis not present

## 2023-03-13 DIAGNOSIS — C300 Malignant neoplasm of nasal cavity: Secondary | ICD-10-CM | POA: Diagnosis not present

## 2023-03-13 DIAGNOSIS — C319 Malignant neoplasm of accessory sinus, unspecified: Secondary | ICD-10-CM | POA: Diagnosis not present

## 2023-03-13 DIAGNOSIS — Z923 Personal history of irradiation: Secondary | ICD-10-CM | POA: Diagnosis not present

## 2023-03-13 DIAGNOSIS — L509 Urticaria, unspecified: Secondary | ICD-10-CM | POA: Diagnosis not present

## 2023-03-16 DIAGNOSIS — M950 Acquired deformity of nose: Secondary | ICD-10-CM | POA: Diagnosis not present

## 2023-03-16 DIAGNOSIS — C4A9 Merkel cell carcinoma, unspecified: Secondary | ICD-10-CM | POA: Diagnosis not present

## 2023-03-17 DIAGNOSIS — R9389 Abnormal findings on diagnostic imaging of other specified body structures: Secondary | ICD-10-CM | POA: Diagnosis not present

## 2023-03-17 DIAGNOSIS — C4A9 Merkel cell carcinoma, unspecified: Secondary | ICD-10-CM | POA: Diagnosis not present

## 2023-03-18 DIAGNOSIS — L259 Unspecified contact dermatitis, unspecified cause: Secondary | ICD-10-CM | POA: Diagnosis not present

## 2023-04-13 DIAGNOSIS — H52203 Unspecified astigmatism, bilateral: Secondary | ICD-10-CM | POA: Diagnosis not present

## 2023-04-13 DIAGNOSIS — Z961 Presence of intraocular lens: Secondary | ICD-10-CM | POA: Diagnosis not present

## 2023-04-13 DIAGNOSIS — D3132 Benign neoplasm of left choroid: Secondary | ICD-10-CM | POA: Diagnosis not present

## 2023-04-17 DIAGNOSIS — M810 Age-related osteoporosis without current pathological fracture: Secondary | ICD-10-CM | POA: Diagnosis not present

## 2023-04-29 DIAGNOSIS — H9311 Tinnitus, right ear: Secondary | ICD-10-CM | POA: Diagnosis not present

## 2023-04-29 DIAGNOSIS — Z01118 Encounter for examination of ears and hearing with other abnormal findings: Secondary | ICD-10-CM | POA: Diagnosis not present

## 2023-04-29 DIAGNOSIS — R42 Dizziness and giddiness: Secondary | ICD-10-CM | POA: Diagnosis not present

## 2023-04-29 DIAGNOSIS — C4A9 Merkel cell carcinoma, unspecified: Secondary | ICD-10-CM | POA: Diagnosis not present

## 2023-04-29 DIAGNOSIS — H903 Sensorineural hearing loss, bilateral: Secondary | ICD-10-CM | POA: Diagnosis not present

## 2023-04-29 DIAGNOSIS — Z974 Presence of external hearing-aid: Secondary | ICD-10-CM | POA: Diagnosis not present

## 2023-05-05 DIAGNOSIS — L259 Unspecified contact dermatitis, unspecified cause: Secondary | ICD-10-CM | POA: Diagnosis not present

## 2023-05-05 DIAGNOSIS — D0471 Carcinoma in situ of skin of right lower limb, including hip: Secondary | ICD-10-CM | POA: Diagnosis not present

## 2023-05-05 DIAGNOSIS — C4A9 Merkel cell carcinoma, unspecified: Secondary | ICD-10-CM | POA: Diagnosis not present

## 2023-05-06 DIAGNOSIS — C4A9 Merkel cell carcinoma, unspecified: Secondary | ICD-10-CM | POA: Diagnosis not present

## 2023-05-06 DIAGNOSIS — H903 Sensorineural hearing loss, bilateral: Secondary | ICD-10-CM | POA: Diagnosis not present

## 2023-05-06 DIAGNOSIS — H9122 Sudden idiopathic hearing loss, left ear: Secondary | ICD-10-CM | POA: Diagnosis not present

## 2023-05-14 DIAGNOSIS — L570 Actinic keratosis: Secondary | ICD-10-CM | POA: Diagnosis not present

## 2023-05-14 DIAGNOSIS — L309 Dermatitis, unspecified: Secondary | ICD-10-CM | POA: Diagnosis not present

## 2023-05-14 DIAGNOSIS — D0471 Carcinoma in situ of skin of right lower limb, including hip: Secondary | ICD-10-CM | POA: Diagnosis not present

## 2023-05-14 DIAGNOSIS — L308 Other specified dermatitis: Secondary | ICD-10-CM | POA: Diagnosis not present

## 2023-06-16 DIAGNOSIS — R9389 Abnormal findings on diagnostic imaging of other specified body structures: Secondary | ICD-10-CM | POA: Diagnosis not present

## 2023-06-16 DIAGNOSIS — C4A9 Merkel cell carcinoma, unspecified: Secondary | ICD-10-CM | POA: Diagnosis not present

## 2023-06-16 DIAGNOSIS — R918 Other nonspecific abnormal finding of lung field: Secondary | ICD-10-CM | POA: Diagnosis not present

## 2023-06-18 DIAGNOSIS — D485 Neoplasm of uncertain behavior of skin: Secondary | ICD-10-CM | POA: Diagnosis not present

## 2023-06-18 DIAGNOSIS — C4A59 Merkel cell carcinoma of other part of trunk: Secondary | ICD-10-CM | POA: Diagnosis not present

## 2023-06-24 DIAGNOSIS — C4A9 Merkel cell carcinoma, unspecified: Secondary | ICD-10-CM | POA: Diagnosis not present

## 2023-07-10 DIAGNOSIS — C4A9 Merkel cell carcinoma, unspecified: Secondary | ICD-10-CM | POA: Diagnosis not present

## 2023-07-24 DIAGNOSIS — K5792 Diverticulitis of intestine, part unspecified, without perforation or abscess without bleeding: Secondary | ICD-10-CM | POA: Diagnosis not present

## 2023-07-29 DIAGNOSIS — Z79899 Other long term (current) drug therapy: Secondary | ICD-10-CM | POA: Diagnosis not present

## 2023-07-29 DIAGNOSIS — J449 Chronic obstructive pulmonary disease, unspecified: Secondary | ICD-10-CM | POA: Diagnosis not present

## 2023-07-29 DIAGNOSIS — C7B1 Secondary Merkel cell carcinoma: Secondary | ICD-10-CM | POA: Diagnosis not present

## 2023-07-29 DIAGNOSIS — E7800 Pure hypercholesterolemia, unspecified: Secondary | ICD-10-CM | POA: Diagnosis not present

## 2023-07-29 DIAGNOSIS — Z7951 Long term (current) use of inhaled steroids: Secondary | ICD-10-CM | POA: Diagnosis not present

## 2023-07-29 DIAGNOSIS — I10 Essential (primary) hypertension: Secondary | ICD-10-CM | POA: Diagnosis not present

## 2023-07-29 DIAGNOSIS — F419 Anxiety disorder, unspecified: Secondary | ICD-10-CM | POA: Diagnosis not present

## 2023-07-29 DIAGNOSIS — C4A8 Merkel cell carcinoma of overlapping sites: Secondary | ICD-10-CM | POA: Diagnosis not present

## 2023-07-29 DIAGNOSIS — C4A4 Merkel cell carcinoma of scalp and neck: Secondary | ICD-10-CM | POA: Diagnosis not present

## 2023-07-29 DIAGNOSIS — L905 Scar conditions and fibrosis of skin: Secondary | ICD-10-CM | POA: Diagnosis not present

## 2023-07-29 DIAGNOSIS — I2510 Atherosclerotic heart disease of native coronary artery without angina pectoris: Secondary | ICD-10-CM | POA: Diagnosis not present

## 2023-08-12 DIAGNOSIS — C4A9 Merkel cell carcinoma, unspecified: Secondary | ICD-10-CM | POA: Diagnosis not present

## 2023-08-31 DIAGNOSIS — C4A9 Merkel cell carcinoma, unspecified: Secondary | ICD-10-CM | POA: Diagnosis not present

## 2023-09-02 DIAGNOSIS — R195 Other fecal abnormalities: Secondary | ICD-10-CM | POA: Diagnosis not present

## 2023-09-10 DIAGNOSIS — C4A9 Merkel cell carcinoma, unspecified: Secondary | ICD-10-CM | POA: Diagnosis not present

## 2023-09-10 DIAGNOSIS — E039 Hypothyroidism, unspecified: Secondary | ICD-10-CM | POA: Diagnosis not present

## 2023-09-10 DIAGNOSIS — Z923 Personal history of irradiation: Secondary | ICD-10-CM | POA: Diagnosis not present

## 2023-09-10 DIAGNOSIS — C300 Malignant neoplasm of nasal cavity: Secondary | ICD-10-CM | POA: Diagnosis not present

## 2023-09-15 DIAGNOSIS — C7B1 Secondary Merkel cell carcinoma: Secondary | ICD-10-CM | POA: Diagnosis not present

## 2023-09-15 DIAGNOSIS — C4A9 Merkel cell carcinoma, unspecified: Secondary | ICD-10-CM | POA: Diagnosis not present

## 2023-09-17 DIAGNOSIS — Z6820 Body mass index (BMI) 20.0-20.9, adult: Secondary | ICD-10-CM | POA: Diagnosis not present

## 2023-09-17 DIAGNOSIS — I2510 Atherosclerotic heart disease of native coronary artery without angina pectoris: Secondary | ICD-10-CM | POA: Diagnosis not present

## 2023-09-17 DIAGNOSIS — Z8619 Personal history of other infectious and parasitic diseases: Secondary | ICD-10-CM | POA: Diagnosis not present

## 2023-09-17 DIAGNOSIS — Z85821 Personal history of Merkel cell carcinoma: Secondary | ICD-10-CM | POA: Diagnosis not present

## 2023-09-17 DIAGNOSIS — I340 Nonrheumatic mitral (valve) insufficiency: Secondary | ICD-10-CM | POA: Diagnosis not present

## 2023-09-17 DIAGNOSIS — F419 Anxiety disorder, unspecified: Secondary | ICD-10-CM | POA: Diagnosis not present

## 2023-09-17 DIAGNOSIS — M810 Age-related osteoporosis without current pathological fracture: Secondary | ICD-10-CM | POA: Diagnosis not present

## 2023-09-17 DIAGNOSIS — Z23 Encounter for immunization: Secondary | ICD-10-CM | POA: Diagnosis not present

## 2023-09-22 DIAGNOSIS — M950 Acquired deformity of nose: Secondary | ICD-10-CM | POA: Diagnosis not present

## 2023-09-22 DIAGNOSIS — Z85821 Personal history of Merkel cell carcinoma: Secondary | ICD-10-CM | POA: Diagnosis not present

## 2023-09-22 DIAGNOSIS — Z0000 Encounter for general adult medical examination without abnormal findings: Secondary | ICD-10-CM | POA: Diagnosis not present

## 2023-09-22 DIAGNOSIS — Z1331 Encounter for screening for depression: Secondary | ICD-10-CM | POA: Diagnosis not present

## 2023-09-23 DIAGNOSIS — C319 Malignant neoplasm of accessory sinus, unspecified: Secondary | ICD-10-CM | POA: Diagnosis not present

## 2023-09-23 DIAGNOSIS — C300 Malignant neoplasm of nasal cavity: Secondary | ICD-10-CM | POA: Diagnosis not present

## 2023-09-23 DIAGNOSIS — R948 Abnormal results of function studies of other organs and systems: Secondary | ICD-10-CM | POA: Diagnosis not present

## 2023-09-23 DIAGNOSIS — E039 Hypothyroidism, unspecified: Secondary | ICD-10-CM | POA: Diagnosis not present

## 2023-09-23 DIAGNOSIS — R5383 Other fatigue: Secondary | ICD-10-CM | POA: Diagnosis not present

## 2023-09-23 DIAGNOSIS — C4A9 Merkel cell carcinoma, unspecified: Secondary | ICD-10-CM | POA: Diagnosis not present

## 2023-09-23 DIAGNOSIS — Z923 Personal history of irradiation: Secondary | ICD-10-CM | POA: Diagnosis not present

## 2023-09-23 DIAGNOSIS — E559 Vitamin D deficiency, unspecified: Secondary | ICD-10-CM | POA: Diagnosis not present

## 2023-09-30 DIAGNOSIS — L821 Other seborrheic keratosis: Secondary | ICD-10-CM | POA: Diagnosis not present

## 2023-09-30 DIAGNOSIS — L578 Other skin changes due to chronic exposure to nonionizing radiation: Secondary | ICD-10-CM | POA: Diagnosis not present

## 2023-09-30 DIAGNOSIS — L814 Other melanin hyperpigmentation: Secondary | ICD-10-CM | POA: Diagnosis not present

## 2023-09-30 DIAGNOSIS — C4A9 Merkel cell carcinoma, unspecified: Secondary | ICD-10-CM | POA: Diagnosis not present

## 2023-09-30 DIAGNOSIS — Z85828 Personal history of other malignant neoplasm of skin: Secondary | ICD-10-CM | POA: Diagnosis not present

## 2023-09-30 DIAGNOSIS — D225 Melanocytic nevi of trunk: Secondary | ICD-10-CM | POA: Diagnosis not present

## 2023-10-19 DIAGNOSIS — M810 Age-related osteoporosis without current pathological fracture: Secondary | ICD-10-CM | POA: Diagnosis not present

## 2023-10-21 DIAGNOSIS — Z23 Encounter for immunization: Secondary | ICD-10-CM | POA: Diagnosis not present

## 2023-10-27 DIAGNOSIS — C4A9 Merkel cell carcinoma, unspecified: Secondary | ICD-10-CM | POA: Diagnosis not present

## 2023-10-29 DIAGNOSIS — K5790 Diverticulosis of intestine, part unspecified, without perforation or abscess without bleeding: Secondary | ICD-10-CM | POA: Diagnosis not present

## 2023-10-29 DIAGNOSIS — K5909 Other constipation: Secondary | ICD-10-CM | POA: Diagnosis not present

## 2023-10-29 DIAGNOSIS — R1032 Left lower quadrant pain: Secondary | ICD-10-CM | POA: Diagnosis not present

## 2023-11-17 ENCOUNTER — Other Ambulatory Visit: Payer: Self-pay

## 2023-11-17 DIAGNOSIS — E782 Mixed hyperlipidemia: Secondary | ICD-10-CM

## 2023-11-17 DIAGNOSIS — Z1231 Encounter for screening mammogram for malignant neoplasm of breast: Secondary | ICD-10-CM | POA: Diagnosis not present

## 2023-11-18 LAB — LIPID PANEL
Chol/HDL Ratio: 2.2 ratio (ref 0.0–4.4)
Cholesterol, Total: 200 mg/dL — ABNORMAL HIGH (ref 100–199)
HDL: 89 mg/dL (ref >39)
LDL Chol Calc (NIH): 98 mg/dL (ref 0–99)
Triglycerides: 75 mg/dL (ref 0–149)
VLDL Cholesterol Cal: 13 mg/dL (ref 5–40)

## 2023-11-20 ENCOUNTER — Ambulatory Visit: Payer: Self-pay | Admitting: Cardiology

## 2023-11-26 DIAGNOSIS — R1032 Left lower quadrant pain: Secondary | ICD-10-CM | POA: Diagnosis not present

## 2023-11-26 DIAGNOSIS — K5790 Diverticulosis of intestine, part unspecified, without perforation or abscess without bleeding: Secondary | ICD-10-CM | POA: Diagnosis not present

## 2023-11-26 DIAGNOSIS — K5909 Other constipation: Secondary | ICD-10-CM | POA: Diagnosis not present

## 2023-12-16 DIAGNOSIS — C4A9 Merkel cell carcinoma, unspecified: Secondary | ICD-10-CM | POA: Diagnosis not present

## 2023-12-17 ENCOUNTER — Ambulatory Visit (HOSPITAL_COMMUNITY)
Admission: RE | Admit: 2023-12-17 | Discharge: 2023-12-17 | Disposition: A | Payer: Medicare Other | Source: Ambulatory Visit | Attending: Internal Medicine | Admitting: Internal Medicine

## 2023-12-17 ENCOUNTER — Telehealth: Payer: Self-pay | Admitting: Cardiology

## 2023-12-17 DIAGNOSIS — I351 Nonrheumatic aortic (valve) insufficiency: Secondary | ICD-10-CM | POA: Diagnosis present

## 2023-12-17 NOTE — Telephone Encounter (Signed)
 Noted.  Thanks MJP

## 2023-12-17 NOTE — Telephone Encounter (Signed)
 Pt came into office to drop off ppwk. I'm placing it in your mailbox. Thank you.

## 2023-12-18 ENCOUNTER — Ambulatory Visit: Payer: Self-pay | Admitting: Cardiology

## 2023-12-18 DIAGNOSIS — M810 Age-related osteoporosis without current pathological fracture: Secondary | ICD-10-CM | POA: Diagnosis not present

## 2023-12-22 DIAGNOSIS — M950 Acquired deformity of nose: Secondary | ICD-10-CM | POA: Diagnosis not present

## 2023-12-23 DIAGNOSIS — C4A9 Merkel cell carcinoma, unspecified: Secondary | ICD-10-CM | POA: Diagnosis not present

## 2023-12-23 DIAGNOSIS — R9389 Abnormal findings on diagnostic imaging of other specified body structures: Secondary | ICD-10-CM | POA: Diagnosis not present

## 2023-12-25 DIAGNOSIS — Z6821 Body mass index (BMI) 21.0-21.9, adult: Secondary | ICD-10-CM | POA: Diagnosis not present

## 2023-12-25 DIAGNOSIS — M8000XD Age-related osteoporosis with current pathological fracture, unspecified site, subsequent encounter for fracture with routine healing: Secondary | ICD-10-CM | POA: Diagnosis not present

## 2023-12-25 DIAGNOSIS — M353 Polymyalgia rheumatica: Secondary | ICD-10-CM | POA: Diagnosis not present

## 2023-12-25 DIAGNOSIS — M1991 Primary osteoarthritis, unspecified site: Secondary | ICD-10-CM | POA: Diagnosis not present

## 2024-01-20 ENCOUNTER — Encounter: Payer: Self-pay | Admitting: Cardiology

## 2024-01-20 ENCOUNTER — Ambulatory Visit: Attending: Cardiovascular Disease | Admitting: Cardiology

## 2024-01-20 VITALS — BP 92/38 | HR 55 | Ht 65.0 in | Wt 135.8 lb

## 2024-01-20 DIAGNOSIS — I10 Essential (primary) hypertension: Secondary | ICD-10-CM | POA: Diagnosis present

## 2024-01-20 DIAGNOSIS — I350 Nonrheumatic aortic (valve) stenosis: Secondary | ICD-10-CM | POA: Diagnosis present

## 2024-01-20 DIAGNOSIS — E782 Mixed hyperlipidemia: Secondary | ICD-10-CM | POA: Insufficient documentation

## 2024-01-20 DIAGNOSIS — I351 Nonrheumatic aortic (valve) insufficiency: Secondary | ICD-10-CM | POA: Diagnosis not present

## 2024-01-20 MED ORDER — ROSUVASTATIN CALCIUM 20 MG PO TABS: 20.0000 mg | ORAL_TABLET | Freq: Every day | ORAL | 3 refills | Status: AC

## 2024-01-20 MED ORDER — HYDRALAZINE HCL 25 MG PO TABS: 25.0000 mg | ORAL_TABLET | Freq: Three times a day (TID) | ORAL | 3 refills | Status: AC | PRN

## 2024-01-20 NOTE — Patient Instructions (Signed)
 Medication Instructions:  CHANGE Crestor  to 20 mg daily   START Hydralazine  25 mg three times a day as needed for systolic blood pressure (top number) greater than 140.  *If you need a refill on your cardiac medications before your next appointment, please call your pharmacy*  Testing/Procedures: Echocardiogram in 1 year   Your physician has requested that you have an echocardiogram. Echocardiography is a painless test that uses sound waves to create images of your heart. It provides your doctor with information about the size and shape of your heart and how well your hearts chambers and valves are working. This procedure takes approximately one hour. There are no restrictions for this procedure. Please do NOT wear cologne, perfume, aftershave, or lotions (deodorant is allowed). Please arrive 15 minutes prior to your appointment time.  Please note: We ask at that you not bring children with you during ultrasound (echo/ vascular) testing. Due to room size and safety concerns, children are not allowed in the ultrasound rooms during exams. Our front office staff cannot provide observation of children in our lobby area while testing is being conducted. An adult accompanying a patient to their appointment will only be allowed in the ultrasound room at the discretion of the ultrasound technician under special circumstances. We apologize for any inconvenience.   Follow-Up: At [facility], you and your health needs are our priority.  As part of our continuing mission to provide you with exceptional heart care, our providers are all part of one team.  This team includes your primary Cardiologist (physician) and Advanced Practice Providers or APPs (Physician Assistants and Nurse Practitioners) who all work together to provide you with the care you need, when you need it.  Your next appointment:   1 year(s) after echocardiogram  Provider:   Newman JINNY Lawrence, MD

## 2024-01-20 NOTE — Progress Notes (Signed)
 " Cardiology Office Note:  .   Date:  01/20/2024  ID:  Angela [name], DOB Jun 17, 1940, MRN [id number] PCP: Cleotilde Planas, MD  [facility] Providers Cardiologist:  Newman Lawrence, MD PCP: Cleotilde Planas, MD  Chief Complaint  Patient presents with   Hyperlipidemia     Angela [name] is a 84 y.o. female with hypertension, hyperlipidemia, coronary atherosclerosis, [name]   Discussed the use of AI scribe software for clinical note transcription with the patient, who gave verbal consent to proceed.  History of Present Illness Angela [name] is an 84 year old female with a history of Merkel cell carcinoma who presents for follow-up after surgery and radiation therapy.  She had excision of a nasal lesion and eleven lymph nodes for Merkel cell carcinoma, and a sternal nodule was excised in July, with no new lesions reported since.  Her home blood pressure readings are higher than in the office. She restricts salt intake at home but has difficulty limiting salt when eating out.  She takes cholesterol medication 10 mg, two tablets every other day on Monday, Wednesday, and Friday, and recently increased to 10 mg on Sunday, Tuesday, and Thursday due to slightly higher cholesterol levels on recent labs.  She has scans and blood work every three months for oncologic monitoring and participates in chair yoga for activity.      Vitals:   01/20/24 0828  BP: (!) 92/38  Pulse: (!) 55  SpO2: 99%      Review of Systems  Cardiovascular:  Negative for chest pain, dyspnea on exertion, leg swelling, palpitations and syncope.        Studies Reviewed: SABRA        EKG 01/20/2024: Sinus rhythm 55 bpm When compared with ECG of 05-May-2017 18:31,  rate is slower    Echocardiogram 12/2023: Personally reviewed and independently interpreted.  Normal LVEF.  I do not appreciate aortic stenosis, but do see mild to moderate aortic gestation, unchanged since prior  echocardiogram.  Labs 11/2023: Chol 200, TG 75, HDL 89, LDL 98  10/2022: Chol 158, TG 64, HDL 76, LDL 69 Hb 13.3 Cr 9.28, Na 129   Physical Exam Vitals and nursing note reviewed.  Constitutional:      General: She is not in acute distress. Neck:     Vascular: No JVD.  Cardiovascular:     Rate and Rhythm: Normal rate and regular rhythm.     Heart sounds: Murmur heard.     High-pitched blowing decrescendo early diastolic murmur is present with a grade of 1/4 at the upper right sternal border radiating to the apex.  Pulmonary:     Effort: Pulmonary effort is normal.     Breath sounds: Normal breath sounds. No wheezing or rales.  Musculoskeletal:     Right lower leg: No edema.     Left lower leg: No edema.      VISIT DIAGNOSES:   ICD-10-CM   1. Nonrheumatic aortic valve insufficiency  I35.1 EKG 12-Lead    2. Mixed hyperlipidemia  E78.2 EKG 12-Lead    3. Essential hypertension  I10 EKG 12-Lead       Angela [name] is a 84 y.o. female with hypertension, hyperlipidemia, coronary atherosclerosis, [name]  Assessment & Plan Nonrheumatic aortic valve insufficiency: Mild to moderate aortic regurgitation on echocardiogram. - Repeat echocardiogram in one year.  Mixed hyperlipidemia: Currently on 10 mg Crestor  three times a week. Recent lipid panel showed slightly elevated levels. Discussed increasing dosage  to 20 mg daily for better control. - Prescribed 20 mg Crestor  daily. - Perform lipid panel in six months.  Essential hypertension: Blood pressure readings at home are generally good, with occasional elevations. Office reading today was low at 92/38. Discussed dietary salt intake and its impact on blood pressure. No current need for antihypertensive medication due to low office reading and good home readings. - Prescribed hydralazine  50 mg for as-needed medication for blood pressure above 140 mmHg.        Meds ordered this encounter  Medications   rosuvastatin   (CRESTOR ) 20 MG tablet    Sig: Take 1 tablet (20 mg total) by mouth daily.    Dispense:  90 tablet    Refill:  3   hydrALAZINE  (APRESOLINE ) 25 MG tablet    Sig: Take 1 tablet (25 mg total) by mouth 3 (three) times daily as needed (FOR SBP (TOP NUMBER) GREATER THAN 140).    Dispense:  90 tablet    Refill:  3     F/u in 1 year  Signed, Newman JINNY Lawrence, MD  "

## 2024-01-21 LAB — ECHOCARDIOGRAM COMPLETE
Area-P 1/2: 4.39 cm2
P 1/2 time: 449 ms
S' Lateral: 2.7 cm

## 2025-01-17 ENCOUNTER — Ambulatory Visit (HOSPITAL_COMMUNITY)
